# Patient Record
Sex: Female | Born: 1935 | Race: White | Hispanic: No | Smoking: Never smoker
Health system: Southern US, Community
[De-identification: ages and names within clinical notes are randomized; demographics above are authoritative.]

## PROBLEM LIST (undated history)

## (undated) DIAGNOSIS — M810 Age-related osteoporosis without current pathological fracture: Secondary | ICD-10-CM

## (undated) DIAGNOSIS — E119 Type 2 diabetes mellitus without complications: Secondary | ICD-10-CM

## (undated) DIAGNOSIS — G309 Alzheimer's disease, unspecified: Secondary | ICD-10-CM

## (undated) DIAGNOSIS — R1310 Dysphagia, unspecified: Secondary | ICD-10-CM

## (undated) DIAGNOSIS — N189 Chronic kidney disease, unspecified: Secondary | ICD-10-CM

## (undated) DIAGNOSIS — I10 Essential (primary) hypertension: Secondary | ICD-10-CM

## (undated) DIAGNOSIS — F0280 Dementia in other diseases classified elsewhere without behavioral disturbance: Secondary | ICD-10-CM

---

## 2014-07-21 DIAGNOSIS — N390 Urinary tract infection, site not specified: Secondary | ICD-10-CM | POA: Diagnosis not present

## 2014-07-21 DIAGNOSIS — F411 Generalized anxiety disorder: Secondary | ICD-10-CM | POA: Diagnosis not present

## 2014-08-18 DIAGNOSIS — R350 Frequency of micturition: Secondary | ICD-10-CM | POA: Diagnosis not present

## 2014-08-18 DIAGNOSIS — F411 Generalized anxiety disorder: Secondary | ICD-10-CM | POA: Diagnosis not present

## 2014-08-18 DIAGNOSIS — M542 Cervicalgia: Secondary | ICD-10-CM | POA: Diagnosis not present

## 2014-09-04 DIAGNOSIS — E119 Type 2 diabetes mellitus without complications: Secondary | ICD-10-CM | POA: Diagnosis not present

## 2014-09-04 DIAGNOSIS — I10 Essential (primary) hypertension: Secondary | ICD-10-CM | POA: Diagnosis not present

## 2014-09-04 DIAGNOSIS — E782 Mixed hyperlipidemia: Secondary | ICD-10-CM | POA: Diagnosis not present

## 2014-09-21 DIAGNOSIS — M542 Cervicalgia: Secondary | ICD-10-CM | POA: Diagnosis not present

## 2014-09-21 DIAGNOSIS — R8290 Unspecified abnormal findings in urine: Secondary | ICD-10-CM | POA: Diagnosis not present

## 2014-10-16 DIAGNOSIS — M62838 Other muscle spasm: Secondary | ICD-10-CM | POA: Diagnosis not present

## 2014-10-25 DIAGNOSIS — M47812 Spondylosis without myelopathy or radiculopathy, cervical region: Secondary | ICD-10-CM | POA: Diagnosis not present

## 2014-10-25 DIAGNOSIS — M5412 Radiculopathy, cervical region: Secondary | ICD-10-CM | POA: Diagnosis not present

## 2014-11-15 DIAGNOSIS — M47812 Spondylosis without myelopathy or radiculopathy, cervical region: Secondary | ICD-10-CM | POA: Diagnosis not present

## 2014-11-15 DIAGNOSIS — M4802 Spinal stenosis, cervical region: Secondary | ICD-10-CM | POA: Diagnosis not present

## 2014-11-15 DIAGNOSIS — M542 Cervicalgia: Secondary | ICD-10-CM | POA: Diagnosis not present

## 2014-11-15 DIAGNOSIS — M5412 Radiculopathy, cervical region: Secondary | ICD-10-CM | POA: Diagnosis not present

## 2014-11-15 DIAGNOSIS — R51 Headache: Secondary | ICD-10-CM | POA: Diagnosis not present

## 2014-11-23 DIAGNOSIS — M25561 Pain in right knee: Secondary | ICD-10-CM | POA: Diagnosis not present

## 2014-11-23 DIAGNOSIS — N390 Urinary tract infection, site not specified: Secondary | ICD-10-CM | POA: Diagnosis not present

## 2014-11-23 DIAGNOSIS — G8929 Other chronic pain: Secondary | ICD-10-CM | POA: Diagnosis not present

## 2014-11-23 DIAGNOSIS — F419 Anxiety disorder, unspecified: Secondary | ICD-10-CM | POA: Diagnosis not present

## 2014-11-28 DIAGNOSIS — I10 Essential (primary) hypertension: Secondary | ICD-10-CM | POA: Diagnosis not present

## 2014-11-28 DIAGNOSIS — Z79899 Other long term (current) drug therapy: Secondary | ICD-10-CM | POA: Diagnosis not present

## 2014-11-28 DIAGNOSIS — E780 Pure hypercholesterolemia: Secondary | ICD-10-CM | POA: Diagnosis not present

## 2014-11-28 DIAGNOSIS — E119 Type 2 diabetes mellitus without complications: Secondary | ICD-10-CM | POA: Diagnosis not present

## 2014-11-28 DIAGNOSIS — N390 Urinary tract infection, site not specified: Secondary | ICD-10-CM | POA: Diagnosis not present

## 2014-12-07 DIAGNOSIS — Z79899 Other long term (current) drug therapy: Secondary | ICD-10-CM | POA: Diagnosis not present

## 2014-12-07 DIAGNOSIS — E119 Type 2 diabetes mellitus without complications: Secondary | ICD-10-CM | POA: Diagnosis not present

## 2014-12-07 DIAGNOSIS — I10 Essential (primary) hypertension: Secondary | ICD-10-CM | POA: Diagnosis not present

## 2014-12-07 DIAGNOSIS — N390 Urinary tract infection, site not specified: Secondary | ICD-10-CM | POA: Diagnosis not present

## 2014-12-07 DIAGNOSIS — E785 Hyperlipidemia, unspecified: Secondary | ICD-10-CM | POA: Diagnosis not present

## 2014-12-07 DIAGNOSIS — F419 Anxiety disorder, unspecified: Secondary | ICD-10-CM | POA: Diagnosis not present

## 2014-12-08 DIAGNOSIS — N390 Urinary tract infection, site not specified: Secondary | ICD-10-CM | POA: Diagnosis not present

## 2015-01-05 DIAGNOSIS — E119 Type 2 diabetes mellitus without complications: Secondary | ICD-10-CM | POA: Diagnosis not present

## 2015-01-19 DIAGNOSIS — N390 Urinary tract infection, site not specified: Secondary | ICD-10-CM | POA: Diagnosis not present

## 2015-01-29 DIAGNOSIS — N390 Urinary tract infection, site not specified: Secondary | ICD-10-CM | POA: Diagnosis not present

## 2015-01-29 DIAGNOSIS — M810 Age-related osteoporosis without current pathological fracture: Secondary | ICD-10-CM | POA: Diagnosis not present

## 2015-01-29 DIAGNOSIS — Z6826 Body mass index (BMI) 26.0-26.9, adult: Secondary | ICD-10-CM | POA: Diagnosis not present

## 2015-01-29 DIAGNOSIS — I10 Essential (primary) hypertension: Secondary | ICD-10-CM | POA: Diagnosis not present

## 2015-01-29 DIAGNOSIS — R450 Nervousness: Secondary | ICD-10-CM | POA: Diagnosis not present

## 2015-02-02 DIAGNOSIS — R350 Frequency of micturition: Secondary | ICD-10-CM | POA: Diagnosis not present

## 2015-02-02 DIAGNOSIS — M949 Disorder of cartilage, unspecified: Secondary | ICD-10-CM | POA: Diagnosis not present

## 2015-02-02 DIAGNOSIS — R5383 Other fatigue: Secondary | ICD-10-CM | POA: Diagnosis not present

## 2015-02-02 DIAGNOSIS — I10 Essential (primary) hypertension: Secondary | ICD-10-CM | POA: Diagnosis not present

## 2015-02-02 DIAGNOSIS — R413 Other amnesia: Secondary | ICD-10-CM | POA: Diagnosis not present

## 2015-02-02 DIAGNOSIS — E785 Hyperlipidemia, unspecified: Secondary | ICD-10-CM | POA: Diagnosis not present

## 2015-02-18 DIAGNOSIS — N390 Urinary tract infection, site not specified: Secondary | ICD-10-CM | POA: Diagnosis not present

## 2015-02-18 DIAGNOSIS — I10 Essential (primary) hypertension: Secondary | ICD-10-CM | POA: Diagnosis not present

## 2015-02-18 DIAGNOSIS — E780 Pure hypercholesterolemia: Secondary | ICD-10-CM | POA: Diagnosis not present

## 2015-02-18 DIAGNOSIS — R1084 Generalized abdominal pain: Secondary | ICD-10-CM | POA: Diagnosis not present

## 2015-02-18 DIAGNOSIS — K5900 Constipation, unspecified: Secondary | ICD-10-CM | POA: Diagnosis not present

## 2015-02-18 DIAGNOSIS — Z79899 Other long term (current) drug therapy: Secondary | ICD-10-CM | POA: Diagnosis not present

## 2015-02-18 DIAGNOSIS — E119 Type 2 diabetes mellitus without complications: Secondary | ICD-10-CM | POA: Diagnosis not present

## 2015-02-18 DIAGNOSIS — F419 Anxiety disorder, unspecified: Secondary | ICD-10-CM | POA: Diagnosis not present

## 2015-02-18 DIAGNOSIS — R109 Unspecified abdominal pain: Secondary | ICD-10-CM | POA: Diagnosis not present

## 2015-02-21 DIAGNOSIS — N390 Urinary tract infection, site not specified: Secondary | ICD-10-CM | POA: Diagnosis not present

## 2015-03-02 DIAGNOSIS — H04123 Dry eye syndrome of bilateral lacrimal glands: Secondary | ICD-10-CM | POA: Diagnosis not present

## 2015-03-02 DIAGNOSIS — E119 Type 2 diabetes mellitus without complications: Secondary | ICD-10-CM | POA: Diagnosis not present

## 2015-03-02 DIAGNOSIS — M25561 Pain in right knee: Secondary | ICD-10-CM | POA: Diagnosis not present

## 2015-03-02 DIAGNOSIS — F419 Anxiety disorder, unspecified: Secondary | ICD-10-CM | POA: Diagnosis not present

## 2015-03-27 DIAGNOSIS — G894 Chronic pain syndrome: Secondary | ICD-10-CM | POA: Diagnosis not present

## 2015-04-19 DIAGNOSIS — Z1231 Encounter for screening mammogram for malignant neoplasm of breast: Secondary | ICD-10-CM | POA: Diagnosis not present

## 2015-04-22 DIAGNOSIS — S46911A Strain of unspecified muscle, fascia and tendon at shoulder and upper arm level, right arm, initial encounter: Secondary | ICD-10-CM | POA: Diagnosis not present

## 2015-04-22 DIAGNOSIS — Z79899 Other long term (current) drug therapy: Secondary | ICD-10-CM | POA: Diagnosis not present

## 2015-04-22 DIAGNOSIS — I10 Essential (primary) hypertension: Secondary | ICD-10-CM | POA: Diagnosis not present

## 2015-04-22 DIAGNOSIS — M25512 Pain in left shoulder: Secondary | ICD-10-CM | POA: Diagnosis not present

## 2015-04-22 DIAGNOSIS — E7800 Pure hypercholesterolemia, unspecified: Secondary | ICD-10-CM | POA: Diagnosis not present

## 2015-04-22 DIAGNOSIS — E119 Type 2 diabetes mellitus without complications: Secondary | ICD-10-CM | POA: Diagnosis not present

## 2015-04-22 DIAGNOSIS — M25511 Pain in right shoulder: Secondary | ICD-10-CM | POA: Diagnosis not present

## 2015-04-22 DIAGNOSIS — S46912A Strain of unspecified muscle, fascia and tendon at shoulder and upper arm level, left arm, initial encounter: Secondary | ICD-10-CM | POA: Diagnosis not present

## 2015-05-03 DIAGNOSIS — M25512 Pain in left shoulder: Secondary | ICD-10-CM | POA: Diagnosis not present

## 2015-05-03 DIAGNOSIS — Z79899 Other long term (current) drug therapy: Secondary | ICD-10-CM | POA: Diagnosis not present

## 2015-05-03 DIAGNOSIS — I10 Essential (primary) hypertension: Secondary | ICD-10-CM | POA: Diagnosis not present

## 2015-05-03 DIAGNOSIS — Z90710 Acquired absence of both cervix and uterus: Secondary | ICD-10-CM | POA: Diagnosis not present

## 2015-05-03 DIAGNOSIS — Z9049 Acquired absence of other specified parts of digestive tract: Secondary | ICD-10-CM | POA: Diagnosis not present

## 2015-05-03 DIAGNOSIS — E119 Type 2 diabetes mellitus without complications: Secondary | ICD-10-CM | POA: Diagnosis not present

## 2015-05-03 DIAGNOSIS — E7800 Pure hypercholesterolemia, unspecified: Secondary | ICD-10-CM | POA: Diagnosis not present

## 2015-05-03 DIAGNOSIS — M25511 Pain in right shoulder: Secondary | ICD-10-CM | POA: Diagnosis not present

## 2015-05-07 DIAGNOSIS — M7581 Other shoulder lesions, right shoulder: Secondary | ICD-10-CM | POA: Diagnosis not present

## 2015-05-09 DIAGNOSIS — M25561 Pain in right knee: Secondary | ICD-10-CM | POA: Diagnosis not present

## 2015-05-09 DIAGNOSIS — Z96653 Presence of artificial knee joint, bilateral: Secondary | ICD-10-CM | POA: Diagnosis not present

## 2015-05-09 DIAGNOSIS — M25562 Pain in left knee: Secondary | ICD-10-CM | POA: Diagnosis not present

## 2015-05-15 DIAGNOSIS — M7581 Other shoulder lesions, right shoulder: Secondary | ICD-10-CM | POA: Diagnosis not present

## 2015-05-21 DIAGNOSIS — Z23 Encounter for immunization: Secondary | ICD-10-CM | POA: Diagnosis not present

## 2015-06-05 DIAGNOSIS — Z79899 Other long term (current) drug therapy: Secondary | ICD-10-CM | POA: Diagnosis not present

## 2015-06-05 DIAGNOSIS — F419 Anxiety disorder, unspecified: Secondary | ICD-10-CM | POA: Diagnosis not present

## 2015-06-05 DIAGNOSIS — I10 Essential (primary) hypertension: Secondary | ICD-10-CM | POA: Diagnosis not present

## 2015-06-05 DIAGNOSIS — E559 Vitamin D deficiency, unspecified: Secondary | ICD-10-CM | POA: Diagnosis not present

## 2015-06-05 DIAGNOSIS — M7581 Other shoulder lesions, right shoulder: Secondary | ICD-10-CM | POA: Diagnosis not present

## 2015-06-05 DIAGNOSIS — Z6827 Body mass index (BMI) 27.0-27.9, adult: Secondary | ICD-10-CM | POA: Diagnosis not present

## 2015-06-05 DIAGNOSIS — N390 Urinary tract infection, site not specified: Secondary | ICD-10-CM | POA: Diagnosis not present

## 2015-06-05 DIAGNOSIS — E119 Type 2 diabetes mellitus without complications: Secondary | ICD-10-CM | POA: Diagnosis not present

## 2015-06-21 DIAGNOSIS — I10 Essential (primary) hypertension: Secondary | ICD-10-CM | POA: Diagnosis not present

## 2015-06-21 DIAGNOSIS — M7550 Bursitis of unspecified shoulder: Secondary | ICD-10-CM | POA: Diagnosis not present

## 2015-06-21 DIAGNOSIS — R4182 Altered mental status, unspecified: Secondary | ICD-10-CM | POA: Diagnosis not present

## 2015-06-21 DIAGNOSIS — E785 Hyperlipidemia, unspecified: Secondary | ICD-10-CM | POA: Diagnosis not present

## 2015-06-21 DIAGNOSIS — E119 Type 2 diabetes mellitus without complications: Secondary | ICD-10-CM | POA: Diagnosis not present

## 2015-06-21 DIAGNOSIS — N390 Urinary tract infection, site not specified: Secondary | ICD-10-CM | POA: Diagnosis not present

## 2015-07-19 DIAGNOSIS — I10 Essential (primary) hypertension: Secondary | ICD-10-CM | POA: Diagnosis not present

## 2015-07-19 DIAGNOSIS — F0390 Unspecified dementia without behavioral disturbance: Secondary | ICD-10-CM | POA: Diagnosis not present

## 2015-07-19 DIAGNOSIS — E782 Mixed hyperlipidemia: Secondary | ICD-10-CM | POA: Diagnosis not present

## 2015-07-19 DIAGNOSIS — E119 Type 2 diabetes mellitus without complications: Secondary | ICD-10-CM | POA: Diagnosis not present

## 2015-08-09 DIAGNOSIS — Z780 Asymptomatic menopausal state: Secondary | ICD-10-CM | POA: Diagnosis not present

## 2015-08-16 DIAGNOSIS — F411 Generalized anxiety disorder: Secondary | ICD-10-CM | POA: Diagnosis not present

## 2015-08-16 DIAGNOSIS — G309 Alzheimer's disease, unspecified: Secondary | ICD-10-CM | POA: Diagnosis not present

## 2015-08-16 DIAGNOSIS — E119 Type 2 diabetes mellitus without complications: Secondary | ICD-10-CM | POA: Diagnosis not present

## 2015-08-28 DIAGNOSIS — F0390 Unspecified dementia without behavioral disturbance: Secondary | ICD-10-CM | POA: Diagnosis not present

## 2015-08-28 DIAGNOSIS — F411 Generalized anxiety disorder: Secondary | ICD-10-CM | POA: Diagnosis not present

## 2015-09-10 DIAGNOSIS — F0390 Unspecified dementia without behavioral disturbance: Secondary | ICD-10-CM | POA: Diagnosis not present

## 2015-09-13 DIAGNOSIS — I10 Essential (primary) hypertension: Secondary | ICD-10-CM | POA: Diagnosis not present

## 2015-09-13 DIAGNOSIS — F0390 Unspecified dementia without behavioral disturbance: Secondary | ICD-10-CM | POA: Diagnosis not present

## 2015-09-13 DIAGNOSIS — F411 Generalized anxiety disorder: Secondary | ICD-10-CM | POA: Diagnosis not present

## 2015-09-13 DIAGNOSIS — R5383 Other fatigue: Secondary | ICD-10-CM | POA: Diagnosis not present

## 2015-09-13 DIAGNOSIS — E119 Type 2 diabetes mellitus without complications: Secondary | ICD-10-CM | POA: Diagnosis not present

## 2015-09-19 DIAGNOSIS — F0280 Dementia in other diseases classified elsewhere without behavioral disturbance: Secondary | ICD-10-CM | POA: Diagnosis not present

## 2015-09-19 DIAGNOSIS — J323 Chronic sphenoidal sinusitis: Secondary | ICD-10-CM | POA: Diagnosis not present

## 2015-09-19 DIAGNOSIS — F0390 Unspecified dementia without behavioral disturbance: Secondary | ICD-10-CM | POA: Diagnosis not present

## 2015-09-19 DIAGNOSIS — G319 Degenerative disease of nervous system, unspecified: Secondary | ICD-10-CM | POA: Diagnosis not present

## 2015-09-24 DIAGNOSIS — I639 Cerebral infarction, unspecified: Secondary | ICD-10-CM | POA: Diagnosis not present

## 2015-09-24 DIAGNOSIS — F0390 Unspecified dementia without behavioral disturbance: Secondary | ICD-10-CM | POA: Diagnosis not present

## 2015-09-28 DIAGNOSIS — Z9181 History of falling: Secondary | ICD-10-CM | POA: Diagnosis not present

## 2015-09-28 DIAGNOSIS — I10 Essential (primary) hypertension: Secondary | ICD-10-CM | POA: Diagnosis not present

## 2015-09-28 DIAGNOSIS — G309 Alzheimer's disease, unspecified: Secondary | ICD-10-CM | POA: Diagnosis not present

## 2015-09-28 DIAGNOSIS — Z87440 Personal history of urinary (tract) infections: Secondary | ICD-10-CM | POA: Diagnosis not present

## 2015-09-28 DIAGNOSIS — E119 Type 2 diabetes mellitus without complications: Secondary | ICD-10-CM | POA: Diagnosis not present

## 2015-09-28 DIAGNOSIS — F419 Anxiety disorder, unspecified: Secondary | ICD-10-CM | POA: Diagnosis not present

## 2015-09-28 DIAGNOSIS — E785 Hyperlipidemia, unspecified: Secondary | ICD-10-CM | POA: Diagnosis not present

## 2015-09-28 DIAGNOSIS — Z7982 Long term (current) use of aspirin: Secondary | ICD-10-CM | POA: Diagnosis not present

## 2015-09-28 DIAGNOSIS — Z8673 Personal history of transient ischemic attack (TIA), and cerebral infarction without residual deficits: Secondary | ICD-10-CM | POA: Diagnosis not present

## 2015-09-28 DIAGNOSIS — F0280 Dementia in other diseases classified elsewhere without behavioral disturbance: Secondary | ICD-10-CM | POA: Diagnosis not present

## 2015-09-28 DIAGNOSIS — R2681 Unsteadiness on feet: Secondary | ICD-10-CM | POA: Diagnosis not present

## 2015-09-28 DIAGNOSIS — M810 Age-related osteoporosis without current pathological fracture: Secondary | ICD-10-CM | POA: Diagnosis not present

## 2015-10-04 DIAGNOSIS — I10 Essential (primary) hypertension: Secondary | ICD-10-CM | POA: Diagnosis not present

## 2015-10-04 DIAGNOSIS — F419 Anxiety disorder, unspecified: Secondary | ICD-10-CM | POA: Diagnosis not present

## 2015-10-04 DIAGNOSIS — G309 Alzheimer's disease, unspecified: Secondary | ICD-10-CM | POA: Diagnosis not present

## 2015-10-04 DIAGNOSIS — F0280 Dementia in other diseases classified elsewhere without behavioral disturbance: Secondary | ICD-10-CM | POA: Diagnosis not present

## 2015-10-04 DIAGNOSIS — R2681 Unsteadiness on feet: Secondary | ICD-10-CM | POA: Diagnosis not present

## 2015-10-04 DIAGNOSIS — E119 Type 2 diabetes mellitus without complications: Secondary | ICD-10-CM | POA: Diagnosis not present

## 2015-10-05 DIAGNOSIS — I10 Essential (primary) hypertension: Secondary | ICD-10-CM | POA: Diagnosis not present

## 2015-10-05 DIAGNOSIS — F419 Anxiety disorder, unspecified: Secondary | ICD-10-CM | POA: Diagnosis not present

## 2015-10-05 DIAGNOSIS — R2681 Unsteadiness on feet: Secondary | ICD-10-CM | POA: Diagnosis not present

## 2015-10-05 DIAGNOSIS — E119 Type 2 diabetes mellitus without complications: Secondary | ICD-10-CM | POA: Diagnosis not present

## 2015-10-05 DIAGNOSIS — F0280 Dementia in other diseases classified elsewhere without behavioral disturbance: Secondary | ICD-10-CM | POA: Diagnosis not present

## 2015-10-05 DIAGNOSIS — G309 Alzheimer's disease, unspecified: Secondary | ICD-10-CM | POA: Diagnosis not present

## 2015-10-08 DIAGNOSIS — R2681 Unsteadiness on feet: Secondary | ICD-10-CM | POA: Diagnosis not present

## 2015-10-08 DIAGNOSIS — F0280 Dementia in other diseases classified elsewhere without behavioral disturbance: Secondary | ICD-10-CM | POA: Diagnosis not present

## 2015-10-08 DIAGNOSIS — I10 Essential (primary) hypertension: Secondary | ICD-10-CM | POA: Diagnosis not present

## 2015-10-08 DIAGNOSIS — E119 Type 2 diabetes mellitus without complications: Secondary | ICD-10-CM | POA: Diagnosis not present

## 2015-10-08 DIAGNOSIS — F419 Anxiety disorder, unspecified: Secondary | ICD-10-CM | POA: Diagnosis not present

## 2015-10-08 DIAGNOSIS — G309 Alzheimer's disease, unspecified: Secondary | ICD-10-CM | POA: Diagnosis not present

## 2015-10-12 DIAGNOSIS — R2681 Unsteadiness on feet: Secondary | ICD-10-CM | POA: Diagnosis not present

## 2015-10-12 DIAGNOSIS — I10 Essential (primary) hypertension: Secondary | ICD-10-CM | POA: Diagnosis not present

## 2015-10-12 DIAGNOSIS — F0280 Dementia in other diseases classified elsewhere without behavioral disturbance: Secondary | ICD-10-CM | POA: Diagnosis not present

## 2015-10-12 DIAGNOSIS — F419 Anxiety disorder, unspecified: Secondary | ICD-10-CM | POA: Diagnosis not present

## 2015-10-12 DIAGNOSIS — E119 Type 2 diabetes mellitus without complications: Secondary | ICD-10-CM | POA: Diagnosis not present

## 2015-10-12 DIAGNOSIS — G309 Alzheimer's disease, unspecified: Secondary | ICD-10-CM | POA: Diagnosis not present

## 2015-10-16 DIAGNOSIS — R2681 Unsteadiness on feet: Secondary | ICD-10-CM | POA: Diagnosis not present

## 2015-10-16 DIAGNOSIS — G309 Alzheimer's disease, unspecified: Secondary | ICD-10-CM | POA: Diagnosis not present

## 2015-10-16 DIAGNOSIS — F419 Anxiety disorder, unspecified: Secondary | ICD-10-CM | POA: Diagnosis not present

## 2015-10-16 DIAGNOSIS — F0280 Dementia in other diseases classified elsewhere without behavioral disturbance: Secondary | ICD-10-CM | POA: Diagnosis not present

## 2015-10-16 DIAGNOSIS — E119 Type 2 diabetes mellitus without complications: Secondary | ICD-10-CM | POA: Diagnosis not present

## 2015-10-16 DIAGNOSIS — I10 Essential (primary) hypertension: Secondary | ICD-10-CM | POA: Diagnosis not present

## 2015-10-18 DIAGNOSIS — F0280 Dementia in other diseases classified elsewhere without behavioral disturbance: Secondary | ICD-10-CM | POA: Diagnosis not present

## 2015-10-18 DIAGNOSIS — G309 Alzheimer's disease, unspecified: Secondary | ICD-10-CM | POA: Diagnosis not present

## 2015-10-18 DIAGNOSIS — E119 Type 2 diabetes mellitus without complications: Secondary | ICD-10-CM | POA: Diagnosis not present

## 2015-10-18 DIAGNOSIS — F419 Anxiety disorder, unspecified: Secondary | ICD-10-CM | POA: Diagnosis not present

## 2015-10-18 DIAGNOSIS — R2681 Unsteadiness on feet: Secondary | ICD-10-CM | POA: Diagnosis not present

## 2015-10-18 DIAGNOSIS — I10 Essential (primary) hypertension: Secondary | ICD-10-CM | POA: Diagnosis not present

## 2015-10-23 DIAGNOSIS — I10 Essential (primary) hypertension: Secondary | ICD-10-CM | POA: Diagnosis not present

## 2015-10-23 DIAGNOSIS — G309 Alzheimer's disease, unspecified: Secondary | ICD-10-CM | POA: Diagnosis not present

## 2015-10-23 DIAGNOSIS — F0280 Dementia in other diseases classified elsewhere without behavioral disturbance: Secondary | ICD-10-CM | POA: Diagnosis not present

## 2015-10-23 DIAGNOSIS — R2681 Unsteadiness on feet: Secondary | ICD-10-CM | POA: Diagnosis not present

## 2015-10-23 DIAGNOSIS — E119 Type 2 diabetes mellitus without complications: Secondary | ICD-10-CM | POA: Diagnosis not present

## 2015-10-23 DIAGNOSIS — F419 Anxiety disorder, unspecified: Secondary | ICD-10-CM | POA: Diagnosis not present

## 2015-10-26 DIAGNOSIS — E119 Type 2 diabetes mellitus without complications: Secondary | ICD-10-CM | POA: Diagnosis not present

## 2015-10-26 DIAGNOSIS — F0280 Dementia in other diseases classified elsewhere without behavioral disturbance: Secondary | ICD-10-CM | POA: Diagnosis not present

## 2015-10-26 DIAGNOSIS — G309 Alzheimer's disease, unspecified: Secondary | ICD-10-CM | POA: Diagnosis not present

## 2015-10-26 DIAGNOSIS — I10 Essential (primary) hypertension: Secondary | ICD-10-CM | POA: Diagnosis not present

## 2015-10-26 DIAGNOSIS — R2681 Unsteadiness on feet: Secondary | ICD-10-CM | POA: Diagnosis not present

## 2015-10-26 DIAGNOSIS — F419 Anxiety disorder, unspecified: Secondary | ICD-10-CM | POA: Diagnosis not present

## 2015-11-03 DIAGNOSIS — M25511 Pain in right shoulder: Secondary | ICD-10-CM | POA: Diagnosis not present

## 2015-11-03 DIAGNOSIS — G479 Sleep disorder, unspecified: Secondary | ICD-10-CM | POA: Diagnosis not present

## 2015-11-03 DIAGNOSIS — Z79891 Long term (current) use of opiate analgesic: Secondary | ICD-10-CM | POA: Diagnosis not present

## 2015-11-03 DIAGNOSIS — F419 Anxiety disorder, unspecified: Secondary | ICD-10-CM | POA: Diagnosis not present

## 2015-11-03 DIAGNOSIS — E119 Type 2 diabetes mellitus without complications: Secondary | ICD-10-CM | POA: Diagnosis not present

## 2015-11-03 DIAGNOSIS — Z8673 Personal history of transient ischemic attack (TIA), and cerebral infarction without residual deficits: Secondary | ICD-10-CM | POA: Diagnosis not present

## 2015-11-03 DIAGNOSIS — M1990 Unspecified osteoarthritis, unspecified site: Secondary | ICD-10-CM | POA: Diagnosis not present

## 2015-11-03 DIAGNOSIS — F0390 Unspecified dementia without behavioral disturbance: Secondary | ICD-10-CM | POA: Diagnosis not present

## 2015-11-03 DIAGNOSIS — M170 Bilateral primary osteoarthritis of knee: Secondary | ICD-10-CM | POA: Diagnosis not present

## 2015-11-03 DIAGNOSIS — M19011 Primary osteoarthritis, right shoulder: Secondary | ICD-10-CM | POA: Diagnosis not present

## 2015-11-03 DIAGNOSIS — Z79899 Other long term (current) drug therapy: Secondary | ICD-10-CM | POA: Diagnosis not present

## 2015-11-03 DIAGNOSIS — G8929 Other chronic pain: Secondary | ICD-10-CM | POA: Diagnosis not present

## 2015-11-03 DIAGNOSIS — I10 Essential (primary) hypertension: Secondary | ICD-10-CM | POA: Diagnosis not present

## 2015-11-03 DIAGNOSIS — M19012 Primary osteoarthritis, left shoulder: Secondary | ICD-10-CM | POA: Diagnosis not present

## 2015-11-03 DIAGNOSIS — Z791 Long term (current) use of non-steroidal anti-inflammatories (NSAID): Secondary | ICD-10-CM | POA: Diagnosis not present

## 2015-11-05 DIAGNOSIS — G309 Alzheimer's disease, unspecified: Secondary | ICD-10-CM | POA: Diagnosis not present

## 2015-11-05 DIAGNOSIS — R2681 Unsteadiness on feet: Secondary | ICD-10-CM | POA: Diagnosis not present

## 2015-11-05 DIAGNOSIS — E119 Type 2 diabetes mellitus without complications: Secondary | ICD-10-CM | POA: Diagnosis not present

## 2015-11-05 DIAGNOSIS — F0280 Dementia in other diseases classified elsewhere without behavioral disturbance: Secondary | ICD-10-CM | POA: Diagnosis not present

## 2015-11-08 DIAGNOSIS — H26493 Other secondary cataract, bilateral: Secondary | ICD-10-CM | POA: Diagnosis not present

## 2015-11-13 DIAGNOSIS — E782 Mixed hyperlipidemia: Secondary | ICD-10-CM | POA: Diagnosis not present

## 2015-11-13 DIAGNOSIS — E119 Type 2 diabetes mellitus without complications: Secondary | ICD-10-CM | POA: Diagnosis not present

## 2015-11-13 DIAGNOSIS — R32 Unspecified urinary incontinence: Secondary | ICD-10-CM | POA: Diagnosis not present

## 2015-11-13 DIAGNOSIS — F0390 Unspecified dementia without behavioral disturbance: Secondary | ICD-10-CM | POA: Diagnosis not present

## 2015-11-13 DIAGNOSIS — F411 Generalized anxiety disorder: Secondary | ICD-10-CM | POA: Diagnosis not present

## 2015-11-13 DIAGNOSIS — I10 Essential (primary) hypertension: Secondary | ICD-10-CM | POA: Diagnosis not present

## 2015-11-19 DIAGNOSIS — G8929 Other chronic pain: Secondary | ICD-10-CM | POA: Diagnosis not present

## 2015-11-19 DIAGNOSIS — S0990XA Unspecified injury of head, initial encounter: Secondary | ICD-10-CM | POA: Diagnosis not present

## 2015-11-19 DIAGNOSIS — S0093XA Contusion of unspecified part of head, initial encounter: Secondary | ICD-10-CM | POA: Diagnosis not present

## 2015-11-19 DIAGNOSIS — G319 Degenerative disease of nervous system, unspecified: Secondary | ICD-10-CM | POA: Diagnosis not present

## 2015-11-19 DIAGNOSIS — M25511 Pain in right shoulder: Secondary | ICD-10-CM | POA: Diagnosis not present

## 2015-11-19 DIAGNOSIS — S20211A Contusion of right front wall of thorax, initial encounter: Secondary | ICD-10-CM | POA: Diagnosis not present

## 2015-11-19 DIAGNOSIS — F0390 Unspecified dementia without behavioral disturbance: Secondary | ICD-10-CM | POA: Diagnosis not present

## 2015-11-19 DIAGNOSIS — S14109A Unspecified injury at unspecified level of cervical spinal cord, initial encounter: Secondary | ICD-10-CM | POA: Diagnosis not present

## 2015-11-19 DIAGNOSIS — S0083XA Contusion of other part of head, initial encounter: Secondary | ICD-10-CM | POA: Diagnosis not present

## 2015-11-19 DIAGNOSIS — E119 Type 2 diabetes mellitus without complications: Secondary | ICD-10-CM | POA: Diagnosis not present

## 2015-11-19 DIAGNOSIS — E7800 Pure hypercholesterolemia, unspecified: Secondary | ICD-10-CM | POA: Diagnosis not present

## 2015-11-19 DIAGNOSIS — Z79899 Other long term (current) drug therapy: Secondary | ICD-10-CM | POA: Diagnosis not present

## 2015-11-19 DIAGNOSIS — I10 Essential (primary) hypertension: Secondary | ICD-10-CM | POA: Diagnosis not present

## 2015-11-19 DIAGNOSIS — R0781 Pleurodynia: Secondary | ICD-10-CM | POA: Diagnosis not present

## 2015-11-19 DIAGNOSIS — Z8673 Personal history of transient ischemic attack (TIA), and cerebral infarction without residual deficits: Secondary | ICD-10-CM | POA: Diagnosis not present

## 2015-12-04 DIAGNOSIS — F0390 Unspecified dementia without behavioral disturbance: Secondary | ICD-10-CM | POA: Diagnosis not present

## 2015-12-04 DIAGNOSIS — F411 Generalized anxiety disorder: Secondary | ICD-10-CM | POA: Diagnosis not present

## 2015-12-04 DIAGNOSIS — G8929 Other chronic pain: Secondary | ICD-10-CM | POA: Diagnosis not present

## 2015-12-04 DIAGNOSIS — Z111 Encounter for screening for respiratory tuberculosis: Secondary | ICD-10-CM | POA: Diagnosis not present

## 2016-01-16 DIAGNOSIS — N390 Urinary tract infection, site not specified: Secondary | ICD-10-CM | POA: Diagnosis not present

## 2016-03-04 DIAGNOSIS — N390 Urinary tract infection, site not specified: Secondary | ICD-10-CM | POA: Diagnosis not present

## 2016-03-23 DIAGNOSIS — M25511 Pain in right shoulder: Secondary | ICD-10-CM | POA: Diagnosis not present

## 2016-03-24 DIAGNOSIS — N390 Urinary tract infection, site not specified: Secondary | ICD-10-CM | POA: Diagnosis not present

## 2016-04-08 DIAGNOSIS — I10 Essential (primary) hypertension: Secondary | ICD-10-CM | POA: Diagnosis not present

## 2016-04-08 DIAGNOSIS — E874 Mixed disorder of acid-base balance: Secondary | ICD-10-CM | POA: Diagnosis not present

## 2016-04-08 DIAGNOSIS — D649 Anemia, unspecified: Secondary | ICD-10-CM | POA: Diagnosis not present

## 2016-06-12 DIAGNOSIS — L0390 Cellulitis, unspecified: Secondary | ICD-10-CM | POA: Diagnosis not present

## 2016-06-23 DIAGNOSIS — B351 Tinea unguium: Secondary | ICD-10-CM | POA: Diagnosis not present

## 2016-06-23 DIAGNOSIS — L301 Dyshidrosis [pompholyx]: Secondary | ICD-10-CM | POA: Diagnosis not present

## 2016-07-08 DIAGNOSIS — E785 Hyperlipidemia, unspecified: Secondary | ICD-10-CM | POA: Diagnosis not present

## 2016-07-08 DIAGNOSIS — I10 Essential (primary) hypertension: Secondary | ICD-10-CM | POA: Diagnosis not present

## 2016-07-17 DIAGNOSIS — R300 Dysuria: Secondary | ICD-10-CM | POA: Diagnosis not present

## 2016-07-17 DIAGNOSIS — T148XXA Other injury of unspecified body region, initial encounter: Secondary | ICD-10-CM | POA: Diagnosis not present

## 2016-07-17 DIAGNOSIS — W19XXXA Unspecified fall, initial encounter: Secondary | ICD-10-CM | POA: Diagnosis not present

## 2016-07-17 DIAGNOSIS — G4700 Insomnia, unspecified: Secondary | ICD-10-CM | POA: Diagnosis not present

## 2016-07-17 DIAGNOSIS — L209 Atopic dermatitis, unspecified: Secondary | ICD-10-CM | POA: Diagnosis not present

## 2016-07-17 DIAGNOSIS — R358 Other polyuria: Secondary | ICD-10-CM | POA: Diagnosis not present

## 2016-08-19 DIAGNOSIS — L609 Nail disorder, unspecified: Secondary | ICD-10-CM | POA: Diagnosis not present

## 2016-08-19 DIAGNOSIS — L600 Ingrowing nail: Secondary | ICD-10-CM | POA: Diagnosis not present

## 2016-08-21 DIAGNOSIS — M25511 Pain in right shoulder: Secondary | ICD-10-CM | POA: Diagnosis not present

## 2016-08-21 DIAGNOSIS — I10 Essential (primary) hypertension: Secondary | ICD-10-CM | POA: Diagnosis not present

## 2016-08-21 DIAGNOSIS — R269 Unspecified abnormalities of gait and mobility: Secondary | ICD-10-CM | POA: Diagnosis not present

## 2016-08-21 DIAGNOSIS — G309 Alzheimer's disease, unspecified: Secondary | ICD-10-CM | POA: Diagnosis not present

## 2016-09-02 DIAGNOSIS — E119 Type 2 diabetes mellitus without complications: Secondary | ICD-10-CM | POA: Diagnosis not present

## 2016-09-02 DIAGNOSIS — E782 Mixed hyperlipidemia: Secondary | ICD-10-CM | POA: Diagnosis not present

## 2016-09-02 DIAGNOSIS — E559 Vitamin D deficiency, unspecified: Secondary | ICD-10-CM | POA: Diagnosis not present

## 2016-09-02 DIAGNOSIS — D518 Other vitamin B12 deficiency anemias: Secondary | ICD-10-CM | POA: Diagnosis not present

## 2016-09-02 DIAGNOSIS — Z79899 Other long term (current) drug therapy: Secondary | ICD-10-CM | POA: Diagnosis not present

## 2016-09-02 DIAGNOSIS — E038 Other specified hypothyroidism: Secondary | ICD-10-CM | POA: Diagnosis not present

## 2016-09-05 DIAGNOSIS — E119 Type 2 diabetes mellitus without complications: Secondary | ICD-10-CM | POA: Diagnosis not present

## 2016-09-05 DIAGNOSIS — Z79899 Other long term (current) drug therapy: Secondary | ICD-10-CM | POA: Diagnosis not present

## 2016-09-25 DIAGNOSIS — K5900 Constipation, unspecified: Secondary | ICD-10-CM | POA: Diagnosis not present

## 2016-10-09 DIAGNOSIS — R54 Age-related physical debility: Secondary | ICD-10-CM | POA: Diagnosis not present

## 2016-10-09 DIAGNOSIS — J309 Allergic rhinitis, unspecified: Secondary | ICD-10-CM | POA: Diagnosis not present

## 2016-10-09 DIAGNOSIS — R05 Cough: Secondary | ICD-10-CM | POA: Diagnosis not present

## 2016-11-11 DIAGNOSIS — L600 Ingrowing nail: Secondary | ICD-10-CM | POA: Diagnosis not present

## 2016-11-11 DIAGNOSIS — L609 Nail disorder, unspecified: Secondary | ICD-10-CM | POA: Diagnosis not present

## 2016-11-13 DIAGNOSIS — G309 Alzheimer's disease, unspecified: Secondary | ICD-10-CM | POA: Diagnosis not present

## 2016-11-13 DIAGNOSIS — E119 Type 2 diabetes mellitus without complications: Secondary | ICD-10-CM | POA: Diagnosis not present

## 2016-11-13 DIAGNOSIS — D649 Anemia, unspecified: Secondary | ICD-10-CM | POA: Diagnosis not present

## 2016-11-13 DIAGNOSIS — I10 Essential (primary) hypertension: Secondary | ICD-10-CM | POA: Diagnosis not present

## 2017-01-08 DIAGNOSIS — R635 Abnormal weight gain: Secondary | ICD-10-CM | POA: Diagnosis not present

## 2017-01-08 DIAGNOSIS — R54 Age-related physical debility: Secondary | ICD-10-CM | POA: Diagnosis not present

## 2017-01-08 DIAGNOSIS — G309 Alzheimer's disease, unspecified: Secondary | ICD-10-CM | POA: Diagnosis not present

## 2017-01-12 DIAGNOSIS — Z79899 Other long term (current) drug therapy: Secondary | ICD-10-CM | POA: Diagnosis not present

## 2017-01-12 DIAGNOSIS — D518 Other vitamin B12 deficiency anemias: Secondary | ICD-10-CM | POA: Diagnosis not present

## 2017-01-12 DIAGNOSIS — E782 Mixed hyperlipidemia: Secondary | ICD-10-CM | POA: Diagnosis not present

## 2017-01-12 DIAGNOSIS — E119 Type 2 diabetes mellitus without complications: Secondary | ICD-10-CM | POA: Diagnosis not present

## 2017-01-25 DIAGNOSIS — L570 Actinic keratosis: Secondary | ICD-10-CM | POA: Diagnosis not present

## 2017-01-28 DIAGNOSIS — R233 Spontaneous ecchymoses: Secondary | ICD-10-CM | POA: Diagnosis not present

## 2017-01-28 DIAGNOSIS — L570 Actinic keratosis: Secondary | ICD-10-CM | POA: Diagnosis not present

## 2017-01-28 DIAGNOSIS — B351 Tinea unguium: Secondary | ICD-10-CM | POA: Diagnosis not present

## 2017-01-30 DIAGNOSIS — I700 Atherosclerosis of aorta: Secondary | ICD-10-CM | POA: Diagnosis not present

## 2017-01-30 DIAGNOSIS — M79661 Pain in right lower leg: Secondary | ICD-10-CM | POA: Diagnosis not present

## 2017-01-30 DIAGNOSIS — R609 Edema, unspecified: Secondary | ICD-10-CM | POA: Diagnosis not present

## 2017-02-12 DIAGNOSIS — M79606 Pain in leg, unspecified: Secondary | ICD-10-CM | POA: Diagnosis not present

## 2017-02-12 DIAGNOSIS — R600 Localized edema: Secondary | ICD-10-CM | POA: Diagnosis not present

## 2017-02-12 DIAGNOSIS — G309 Alzheimer's disease, unspecified: Secondary | ICD-10-CM | POA: Diagnosis not present

## 2017-02-12 DIAGNOSIS — R269 Unspecified abnormalities of gait and mobility: Secondary | ICD-10-CM | POA: Diagnosis not present

## 2017-02-12 DIAGNOSIS — R54 Age-related physical debility: Secondary | ICD-10-CM | POA: Diagnosis not present

## 2017-02-17 DIAGNOSIS — L609 Nail disorder, unspecified: Secondary | ICD-10-CM | POA: Diagnosis not present

## 2017-03-05 DIAGNOSIS — I10 Essential (primary) hypertension: Secondary | ICD-10-CM | POA: Diagnosis not present

## 2017-03-05 DIAGNOSIS — G309 Alzheimer's disease, unspecified: Secondary | ICD-10-CM | POA: Diagnosis not present

## 2017-03-05 DIAGNOSIS — D649 Anemia, unspecified: Secondary | ICD-10-CM | POA: Diagnosis not present

## 2017-03-05 DIAGNOSIS — E119 Type 2 diabetes mellitus without complications: Secondary | ICD-10-CM | POA: Diagnosis not present

## 2017-03-17 DIAGNOSIS — Z79899 Other long term (current) drug therapy: Secondary | ICD-10-CM | POA: Diagnosis not present

## 2017-03-17 DIAGNOSIS — E119 Type 2 diabetes mellitus without complications: Secondary | ICD-10-CM | POA: Diagnosis not present

## 2017-03-17 DIAGNOSIS — E782 Mixed hyperlipidemia: Secondary | ICD-10-CM | POA: Diagnosis not present

## 2017-03-17 DIAGNOSIS — D518 Other vitamin B12 deficiency anemias: Secondary | ICD-10-CM | POA: Diagnosis not present

## 2017-04-01 DIAGNOSIS — E1165 Type 2 diabetes mellitus with hyperglycemia: Secondary | ICD-10-CM | POA: Diagnosis not present

## 2017-04-01 DIAGNOSIS — R410 Disorientation, unspecified: Secondary | ICD-10-CM | POA: Diagnosis not present

## 2017-04-01 DIAGNOSIS — R300 Dysuria: Secondary | ICD-10-CM | POA: Diagnosis not present

## 2017-04-13 DIAGNOSIS — N390 Urinary tract infection, site not specified: Secondary | ICD-10-CM | POA: Diagnosis not present

## 2017-04-30 DIAGNOSIS — R269 Unspecified abnormalities of gait and mobility: Secondary | ICD-10-CM | POA: Diagnosis not present

## 2017-04-30 DIAGNOSIS — G309 Alzheimer's disease, unspecified: Secondary | ICD-10-CM | POA: Diagnosis not present

## 2017-04-30 DIAGNOSIS — R54 Age-related physical debility: Secondary | ICD-10-CM | POA: Diagnosis not present

## 2017-04-30 DIAGNOSIS — M6281 Muscle weakness (generalized): Secondary | ICD-10-CM | POA: Diagnosis not present

## 2017-05-01 DIAGNOSIS — E119 Type 2 diabetes mellitus without complications: Secondary | ICD-10-CM | POA: Diagnosis not present

## 2017-05-01 DIAGNOSIS — D518 Other vitamin B12 deficiency anemias: Secondary | ICD-10-CM | POA: Diagnosis not present

## 2017-05-01 DIAGNOSIS — E7849 Other hyperlipidemia: Secondary | ICD-10-CM | POA: Diagnosis not present

## 2017-05-01 DIAGNOSIS — Z79899 Other long term (current) drug therapy: Secondary | ICD-10-CM | POA: Diagnosis not present

## 2017-05-05 DIAGNOSIS — L600 Ingrowing nail: Secondary | ICD-10-CM | POA: Diagnosis not present

## 2017-05-05 DIAGNOSIS — L03031 Cellulitis of right toe: Secondary | ICD-10-CM | POA: Diagnosis not present

## 2017-05-05 DIAGNOSIS — L609 Nail disorder, unspecified: Secondary | ICD-10-CM | POA: Diagnosis not present

## 2017-05-07 DIAGNOSIS — E119 Type 2 diabetes mellitus without complications: Secondary | ICD-10-CM | POA: Diagnosis not present

## 2017-05-07 DIAGNOSIS — Z7982 Long term (current) use of aspirin: Secondary | ICD-10-CM | POA: Diagnosis not present

## 2017-05-07 DIAGNOSIS — F0280 Dementia in other diseases classified elsewhere without behavioral disturbance: Secondary | ICD-10-CM | POA: Diagnosis not present

## 2017-05-07 DIAGNOSIS — Z9181 History of falling: Secondary | ICD-10-CM | POA: Diagnosis not present

## 2017-05-07 DIAGNOSIS — M6281 Muscle weakness (generalized): Secondary | ICD-10-CM | POA: Diagnosis not present

## 2017-05-07 DIAGNOSIS — I10 Essential (primary) hypertension: Secondary | ICD-10-CM | POA: Diagnosis not present

## 2017-05-07 DIAGNOSIS — R2689 Other abnormalities of gait and mobility: Secondary | ICD-10-CM | POA: Diagnosis not present

## 2017-05-07 DIAGNOSIS — F329 Major depressive disorder, single episode, unspecified: Secondary | ICD-10-CM | POA: Diagnosis not present

## 2017-05-07 DIAGNOSIS — G309 Alzheimer's disease, unspecified: Secondary | ICD-10-CM | POA: Diagnosis not present

## 2017-05-12 DIAGNOSIS — R2689 Other abnormalities of gait and mobility: Secondary | ICD-10-CM | POA: Diagnosis not present

## 2017-05-12 DIAGNOSIS — Z9181 History of falling: Secondary | ICD-10-CM | POA: Diagnosis not present

## 2017-05-12 DIAGNOSIS — M6281 Muscle weakness (generalized): Secondary | ICD-10-CM | POA: Diagnosis not present

## 2017-05-12 DIAGNOSIS — E119 Type 2 diabetes mellitus without complications: Secondary | ICD-10-CM | POA: Diagnosis not present

## 2017-05-12 DIAGNOSIS — I10 Essential (primary) hypertension: Secondary | ICD-10-CM | POA: Diagnosis not present

## 2017-05-12 DIAGNOSIS — G309 Alzheimer's disease, unspecified: Secondary | ICD-10-CM | POA: Diagnosis not present

## 2017-05-15 DIAGNOSIS — M6281 Muscle weakness (generalized): Secondary | ICD-10-CM | POA: Diagnosis not present

## 2017-05-15 DIAGNOSIS — E119 Type 2 diabetes mellitus without complications: Secondary | ICD-10-CM | POA: Diagnosis not present

## 2017-05-15 DIAGNOSIS — G309 Alzheimer's disease, unspecified: Secondary | ICD-10-CM | POA: Diagnosis not present

## 2017-05-15 DIAGNOSIS — Z9181 History of falling: Secondary | ICD-10-CM | POA: Diagnosis not present

## 2017-05-15 DIAGNOSIS — R2689 Other abnormalities of gait and mobility: Secondary | ICD-10-CM | POA: Diagnosis not present

## 2017-05-15 DIAGNOSIS — I10 Essential (primary) hypertension: Secondary | ICD-10-CM | POA: Diagnosis not present

## 2017-05-18 DIAGNOSIS — M6281 Muscle weakness (generalized): Secondary | ICD-10-CM | POA: Diagnosis not present

## 2017-05-18 DIAGNOSIS — E119 Type 2 diabetes mellitus without complications: Secondary | ICD-10-CM | POA: Diagnosis not present

## 2017-05-18 DIAGNOSIS — Z9181 History of falling: Secondary | ICD-10-CM | POA: Diagnosis not present

## 2017-05-18 DIAGNOSIS — I10 Essential (primary) hypertension: Secondary | ICD-10-CM | POA: Diagnosis not present

## 2017-05-18 DIAGNOSIS — G309 Alzheimer's disease, unspecified: Secondary | ICD-10-CM | POA: Diagnosis not present

## 2017-05-18 DIAGNOSIS — R2689 Other abnormalities of gait and mobility: Secondary | ICD-10-CM | POA: Diagnosis not present

## 2017-05-20 DIAGNOSIS — R2689 Other abnormalities of gait and mobility: Secondary | ICD-10-CM | POA: Diagnosis not present

## 2017-05-20 DIAGNOSIS — E119 Type 2 diabetes mellitus without complications: Secondary | ICD-10-CM | POA: Diagnosis not present

## 2017-05-20 DIAGNOSIS — Z9181 History of falling: Secondary | ICD-10-CM | POA: Diagnosis not present

## 2017-05-20 DIAGNOSIS — M6281 Muscle weakness (generalized): Secondary | ICD-10-CM | POA: Diagnosis not present

## 2017-05-20 DIAGNOSIS — G309 Alzheimer's disease, unspecified: Secondary | ICD-10-CM | POA: Diagnosis not present

## 2017-05-20 DIAGNOSIS — I10 Essential (primary) hypertension: Secondary | ICD-10-CM | POA: Diagnosis not present

## 2017-05-21 DIAGNOSIS — R269 Unspecified abnormalities of gait and mobility: Secondary | ICD-10-CM | POA: Diagnosis not present

## 2017-05-21 DIAGNOSIS — R54 Age-related physical debility: Secondary | ICD-10-CM | POA: Diagnosis not present

## 2017-05-21 DIAGNOSIS — R2689 Other abnormalities of gait and mobility: Secondary | ICD-10-CM | POA: Diagnosis not present

## 2017-05-21 DIAGNOSIS — M6281 Muscle weakness (generalized): Secondary | ICD-10-CM | POA: Diagnosis not present

## 2017-05-25 DIAGNOSIS — E119 Type 2 diabetes mellitus without complications: Secondary | ICD-10-CM | POA: Diagnosis not present

## 2017-05-25 DIAGNOSIS — G309 Alzheimer's disease, unspecified: Secondary | ICD-10-CM | POA: Diagnosis not present

## 2017-05-25 DIAGNOSIS — Z9181 History of falling: Secondary | ICD-10-CM | POA: Diagnosis not present

## 2017-05-25 DIAGNOSIS — R2689 Other abnormalities of gait and mobility: Secondary | ICD-10-CM | POA: Diagnosis not present

## 2017-05-25 DIAGNOSIS — M6281 Muscle weakness (generalized): Secondary | ICD-10-CM | POA: Diagnosis not present

## 2017-05-25 DIAGNOSIS — I10 Essential (primary) hypertension: Secondary | ICD-10-CM | POA: Diagnosis not present

## 2017-05-26 DIAGNOSIS — I10 Essential (primary) hypertension: Secondary | ICD-10-CM | POA: Diagnosis not present

## 2017-05-26 DIAGNOSIS — R2689 Other abnormalities of gait and mobility: Secondary | ICD-10-CM | POA: Diagnosis not present

## 2017-05-26 DIAGNOSIS — M6281 Muscle weakness (generalized): Secondary | ICD-10-CM | POA: Diagnosis not present

## 2017-05-26 DIAGNOSIS — Z9181 History of falling: Secondary | ICD-10-CM | POA: Diagnosis not present

## 2017-05-26 DIAGNOSIS — G309 Alzheimer's disease, unspecified: Secondary | ICD-10-CM | POA: Diagnosis not present

## 2017-05-26 DIAGNOSIS — E119 Type 2 diabetes mellitus without complications: Secondary | ICD-10-CM | POA: Diagnosis not present

## 2017-06-02 DIAGNOSIS — E119 Type 2 diabetes mellitus without complications: Secondary | ICD-10-CM | POA: Diagnosis not present

## 2017-06-02 DIAGNOSIS — I10 Essential (primary) hypertension: Secondary | ICD-10-CM | POA: Diagnosis not present

## 2017-06-02 DIAGNOSIS — M6281 Muscle weakness (generalized): Secondary | ICD-10-CM | POA: Diagnosis not present

## 2017-06-02 DIAGNOSIS — R2689 Other abnormalities of gait and mobility: Secondary | ICD-10-CM | POA: Diagnosis not present

## 2017-06-02 DIAGNOSIS — Z9181 History of falling: Secondary | ICD-10-CM | POA: Diagnosis not present

## 2017-06-02 DIAGNOSIS — G309 Alzheimer's disease, unspecified: Secondary | ICD-10-CM | POA: Diagnosis not present

## 2017-06-03 DIAGNOSIS — G309 Alzheimer's disease, unspecified: Secondary | ICD-10-CM | POA: Diagnosis not present

## 2017-06-03 DIAGNOSIS — R4182 Altered mental status, unspecified: Secondary | ICD-10-CM | POA: Diagnosis not present

## 2017-06-03 DIAGNOSIS — Z87440 Personal history of urinary (tract) infections: Secondary | ICD-10-CM | POA: Diagnosis not present

## 2017-06-03 DIAGNOSIS — R54 Age-related physical debility: Secondary | ICD-10-CM | POA: Diagnosis not present

## 2017-06-05 DIAGNOSIS — N390 Urinary tract infection, site not specified: Secondary | ICD-10-CM | POA: Diagnosis not present

## 2017-06-09 DIAGNOSIS — G309 Alzheimer's disease, unspecified: Secondary | ICD-10-CM | POA: Diagnosis not present

## 2017-06-09 DIAGNOSIS — R2689 Other abnormalities of gait and mobility: Secondary | ICD-10-CM | POA: Diagnosis not present

## 2017-06-09 DIAGNOSIS — E119 Type 2 diabetes mellitus without complications: Secondary | ICD-10-CM | POA: Diagnosis not present

## 2017-06-09 DIAGNOSIS — I10 Essential (primary) hypertension: Secondary | ICD-10-CM | POA: Diagnosis not present

## 2017-06-09 DIAGNOSIS — Z9181 History of falling: Secondary | ICD-10-CM | POA: Diagnosis not present

## 2017-06-09 DIAGNOSIS — M6281 Muscle weakness (generalized): Secondary | ICD-10-CM | POA: Diagnosis not present

## 2017-06-11 DIAGNOSIS — R269 Unspecified abnormalities of gait and mobility: Secondary | ICD-10-CM | POA: Diagnosis not present

## 2017-06-11 DIAGNOSIS — R4182 Altered mental status, unspecified: Secondary | ICD-10-CM | POA: Diagnosis not present

## 2017-06-11 DIAGNOSIS — G309 Alzheimer's disease, unspecified: Secondary | ICD-10-CM | POA: Diagnosis not present

## 2017-06-11 DIAGNOSIS — R54 Age-related physical debility: Secondary | ICD-10-CM | POA: Diagnosis not present

## 2017-06-11 DIAGNOSIS — W19XXXA Unspecified fall, initial encounter: Secondary | ICD-10-CM | POA: Diagnosis not present

## 2017-06-11 DIAGNOSIS — M6281 Muscle weakness (generalized): Secondary | ICD-10-CM | POA: Diagnosis not present

## 2017-06-15 DIAGNOSIS — G309 Alzheimer's disease, unspecified: Secondary | ICD-10-CM | POA: Diagnosis not present

## 2017-06-15 DIAGNOSIS — I10 Essential (primary) hypertension: Secondary | ICD-10-CM | POA: Diagnosis not present

## 2017-06-15 DIAGNOSIS — E119 Type 2 diabetes mellitus without complications: Secondary | ICD-10-CM | POA: Diagnosis not present

## 2017-06-15 DIAGNOSIS — R2689 Other abnormalities of gait and mobility: Secondary | ICD-10-CM | POA: Diagnosis not present

## 2017-06-15 DIAGNOSIS — M6281 Muscle weakness (generalized): Secondary | ICD-10-CM | POA: Diagnosis not present

## 2017-06-15 DIAGNOSIS — Z9181 History of falling: Secondary | ICD-10-CM | POA: Diagnosis not present

## 2017-06-17 DIAGNOSIS — E7849 Other hyperlipidemia: Secondary | ICD-10-CM | POA: Diagnosis not present

## 2017-06-17 DIAGNOSIS — Z79899 Other long term (current) drug therapy: Secondary | ICD-10-CM | POA: Diagnosis not present

## 2017-06-17 DIAGNOSIS — E038 Other specified hypothyroidism: Secondary | ICD-10-CM | POA: Diagnosis not present

## 2017-06-17 DIAGNOSIS — E119 Type 2 diabetes mellitus without complications: Secondary | ICD-10-CM | POA: Diagnosis not present

## 2017-06-17 DIAGNOSIS — D518 Other vitamin B12 deficiency anemias: Secondary | ICD-10-CM | POA: Diagnosis not present

## 2017-06-25 DIAGNOSIS — I10 Essential (primary) hypertension: Secondary | ICD-10-CM | POA: Diagnosis not present

## 2017-06-25 DIAGNOSIS — R2689 Other abnormalities of gait and mobility: Secondary | ICD-10-CM | POA: Diagnosis not present

## 2017-06-25 DIAGNOSIS — E119 Type 2 diabetes mellitus without complications: Secondary | ICD-10-CM | POA: Diagnosis not present

## 2017-06-25 DIAGNOSIS — G309 Alzheimer's disease, unspecified: Secondary | ICD-10-CM | POA: Diagnosis not present

## 2017-06-25 DIAGNOSIS — Z9181 History of falling: Secondary | ICD-10-CM | POA: Diagnosis not present

## 2017-06-25 DIAGNOSIS — M6281 Muscle weakness (generalized): Secondary | ICD-10-CM | POA: Diagnosis not present

## 2017-07-02 DIAGNOSIS — E119 Type 2 diabetes mellitus without complications: Secondary | ICD-10-CM | POA: Diagnosis not present

## 2017-07-02 DIAGNOSIS — D649 Anemia, unspecified: Secondary | ICD-10-CM | POA: Diagnosis not present

## 2017-07-02 DIAGNOSIS — G309 Alzheimer's disease, unspecified: Secondary | ICD-10-CM | POA: Diagnosis not present

## 2017-07-02 DIAGNOSIS — I10 Essential (primary) hypertension: Secondary | ICD-10-CM | POA: Diagnosis not present

## 2017-07-16 DIAGNOSIS — M79622 Pain in left upper arm: Secondary | ICD-10-CM | POA: Diagnosis not present

## 2017-07-16 DIAGNOSIS — R269 Unspecified abnormalities of gait and mobility: Secondary | ICD-10-CM | POA: Diagnosis not present

## 2017-07-16 DIAGNOSIS — R54 Age-related physical debility: Secondary | ICD-10-CM | POA: Diagnosis not present

## 2017-07-16 DIAGNOSIS — W19XXXA Unspecified fall, initial encounter: Secondary | ICD-10-CM | POA: Diagnosis not present

## 2017-07-16 DIAGNOSIS — M6281 Muscle weakness (generalized): Secondary | ICD-10-CM | POA: Diagnosis not present

## 2017-07-16 DIAGNOSIS — G309 Alzheimer's disease, unspecified: Secondary | ICD-10-CM | POA: Diagnosis not present

## 2017-07-17 DIAGNOSIS — M25512 Pain in left shoulder: Secondary | ICD-10-CM | POA: Diagnosis not present

## 2017-07-17 DIAGNOSIS — M79622 Pain in left upper arm: Secondary | ICD-10-CM | POA: Diagnosis not present

## 2017-07-17 DIAGNOSIS — M19012 Primary osteoarthritis, left shoulder: Secondary | ICD-10-CM | POA: Diagnosis not present

## 2017-07-17 DIAGNOSIS — S4992XA Unspecified injury of left shoulder and upper arm, initial encounter: Secondary | ICD-10-CM | POA: Diagnosis not present

## 2017-07-23 DIAGNOSIS — R269 Unspecified abnormalities of gait and mobility: Secondary | ICD-10-CM | POA: Diagnosis not present

## 2017-07-23 DIAGNOSIS — R54 Age-related physical debility: Secondary | ICD-10-CM | POA: Diagnosis not present

## 2017-07-23 DIAGNOSIS — M6281 Muscle weakness (generalized): Secondary | ICD-10-CM | POA: Diagnosis not present

## 2017-07-23 DIAGNOSIS — W19XXXS Unspecified fall, sequela: Secondary | ICD-10-CM | POA: Diagnosis not present

## 2017-07-23 DIAGNOSIS — G309 Alzheimer's disease, unspecified: Secondary | ICD-10-CM | POA: Diagnosis not present

## 2017-07-23 DIAGNOSIS — Z9181 History of falling: Secondary | ICD-10-CM | POA: Diagnosis not present

## 2017-07-29 DIAGNOSIS — M6281 Muscle weakness (generalized): Secondary | ICD-10-CM | POA: Diagnosis not present

## 2017-07-29 DIAGNOSIS — F329 Major depressive disorder, single episode, unspecified: Secondary | ICD-10-CM | POA: Diagnosis not present

## 2017-07-29 DIAGNOSIS — Z7982 Long term (current) use of aspirin: Secondary | ICD-10-CM | POA: Diagnosis not present

## 2017-07-29 DIAGNOSIS — G309 Alzheimer's disease, unspecified: Secondary | ICD-10-CM | POA: Diagnosis not present

## 2017-07-29 DIAGNOSIS — I10 Essential (primary) hypertension: Secondary | ICD-10-CM | POA: Diagnosis not present

## 2017-07-29 DIAGNOSIS — M79622 Pain in left upper arm: Secondary | ICD-10-CM | POA: Diagnosis not present

## 2017-07-29 DIAGNOSIS — Z9181 History of falling: Secondary | ICD-10-CM | POA: Diagnosis not present

## 2017-07-29 DIAGNOSIS — E119 Type 2 diabetes mellitus without complications: Secondary | ICD-10-CM | POA: Diagnosis not present

## 2017-07-29 DIAGNOSIS — R296 Repeated falls: Secondary | ICD-10-CM | POA: Diagnosis not present

## 2017-07-29 DIAGNOSIS — F0280 Dementia in other diseases classified elsewhere without behavioral disturbance: Secondary | ICD-10-CM | POA: Diagnosis not present

## 2017-07-30 DIAGNOSIS — M810 Age-related osteoporosis without current pathological fracture: Secondary | ICD-10-CM | POA: Diagnosis not present

## 2017-07-30 DIAGNOSIS — M545 Low back pain: Secondary | ICD-10-CM | POA: Diagnosis not present

## 2017-07-30 DIAGNOSIS — G309 Alzheimer's disease, unspecified: Secondary | ICD-10-CM | POA: Diagnosis not present

## 2017-07-30 DIAGNOSIS — M8588 Other specified disorders of bone density and structure, other site: Secondary | ICD-10-CM | POA: Diagnosis not present

## 2017-07-30 DIAGNOSIS — M549 Dorsalgia, unspecified: Secondary | ICD-10-CM | POA: Diagnosis not present

## 2017-07-30 DIAGNOSIS — R269 Unspecified abnormalities of gait and mobility: Secondary | ICD-10-CM | POA: Diagnosis not present

## 2017-07-30 DIAGNOSIS — S3992XA Unspecified injury of lower back, initial encounter: Secondary | ICD-10-CM | POA: Diagnosis not present

## 2017-07-30 DIAGNOSIS — M6281 Muscle weakness (generalized): Secondary | ICD-10-CM | POA: Diagnosis not present

## 2017-07-30 DIAGNOSIS — R54 Age-related physical debility: Secondary | ICD-10-CM | POA: Diagnosis not present

## 2017-07-30 DIAGNOSIS — I700 Atherosclerosis of aorta: Secondary | ICD-10-CM | POA: Diagnosis not present

## 2017-07-30 DIAGNOSIS — S22089A Unspecified fracture of T11-T12 vertebra, initial encounter for closed fracture: Secondary | ICD-10-CM | POA: Diagnosis not present

## 2017-07-30 DIAGNOSIS — Z9181 History of falling: Secondary | ICD-10-CM | POA: Diagnosis not present

## 2017-07-30 DIAGNOSIS — M1990 Unspecified osteoarthritis, unspecified site: Secondary | ICD-10-CM | POA: Diagnosis not present

## 2017-07-30 DIAGNOSIS — M546 Pain in thoracic spine: Secondary | ICD-10-CM | POA: Diagnosis not present

## 2017-07-30 DIAGNOSIS — M5136 Other intervertebral disc degeneration, lumbar region: Secondary | ICD-10-CM | POA: Diagnosis not present

## 2017-07-30 DIAGNOSIS — M47816 Spondylosis without myelopathy or radiculopathy, lumbar region: Secondary | ICD-10-CM | POA: Diagnosis not present

## 2017-07-31 DIAGNOSIS — N390 Urinary tract infection, site not specified: Secondary | ICD-10-CM | POA: Diagnosis not present

## 2017-08-06 DIAGNOSIS — M8550 Aneurysmal bone cyst, unspecified site: Secondary | ICD-10-CM | POA: Diagnosis not present

## 2017-08-06 DIAGNOSIS — R269 Unspecified abnormalities of gait and mobility: Secondary | ICD-10-CM | POA: Diagnosis not present

## 2017-08-06 DIAGNOSIS — I700 Atherosclerosis of aorta: Secondary | ICD-10-CM | POA: Diagnosis not present

## 2017-08-06 DIAGNOSIS — R296 Repeated falls: Secondary | ICD-10-CM | POA: Diagnosis not present

## 2017-08-06 DIAGNOSIS — I10 Essential (primary) hypertension: Secondary | ICD-10-CM | POA: Diagnosis not present

## 2017-08-06 DIAGNOSIS — M5135 Other intervertebral disc degeneration, thoracolumbar region: Secondary | ICD-10-CM | POA: Diagnosis not present

## 2017-08-06 DIAGNOSIS — M545 Low back pain: Secondary | ICD-10-CM | POA: Diagnosis not present

## 2017-08-06 DIAGNOSIS — M6281 Muscle weakness (generalized): Secondary | ICD-10-CM | POA: Diagnosis not present

## 2017-08-06 DIAGNOSIS — S22080A Wedge compression fracture of T11-T12 vertebra, initial encounter for closed fracture: Secondary | ICD-10-CM | POA: Diagnosis not present

## 2017-08-06 DIAGNOSIS — S32000S Wedge compression fracture of unspecified lumbar vertebra, sequela: Secondary | ICD-10-CM | POA: Diagnosis not present

## 2017-08-06 DIAGNOSIS — M79622 Pain in left upper arm: Secondary | ICD-10-CM | POA: Diagnosis not present

## 2017-08-06 DIAGNOSIS — E119 Type 2 diabetes mellitus without complications: Secondary | ICD-10-CM | POA: Diagnosis not present

## 2017-08-06 DIAGNOSIS — Z9181 History of falling: Secondary | ICD-10-CM | POA: Diagnosis not present

## 2017-08-06 DIAGNOSIS — G309 Alzheimer's disease, unspecified: Secondary | ICD-10-CM | POA: Diagnosis not present

## 2017-08-12 DIAGNOSIS — S32010A Wedge compression fracture of first lumbar vertebra, initial encounter for closed fracture: Secondary | ICD-10-CM | POA: Diagnosis not present

## 2017-08-12 DIAGNOSIS — S22080A Wedge compression fracture of T11-T12 vertebra, initial encounter for closed fracture: Secondary | ICD-10-CM | POA: Diagnosis not present

## 2017-08-18 DIAGNOSIS — L600 Ingrowing nail: Secondary | ICD-10-CM | POA: Diagnosis not present

## 2017-08-18 DIAGNOSIS — L609 Nail disorder, unspecified: Secondary | ICD-10-CM | POA: Diagnosis not present

## 2017-08-18 DIAGNOSIS — L03039 Cellulitis of unspecified toe: Secondary | ICD-10-CM | POA: Diagnosis not present

## 2017-08-19 DIAGNOSIS — N390 Urinary tract infection, site not specified: Secondary | ICD-10-CM | POA: Diagnosis not present

## 2017-08-19 DIAGNOSIS — R4182 Altered mental status, unspecified: Secondary | ICD-10-CM | POA: Diagnosis not present

## 2017-08-20 DIAGNOSIS — R54 Age-related physical debility: Secondary | ICD-10-CM | POA: Diagnosis not present

## 2017-08-20 DIAGNOSIS — R1030 Lower abdominal pain, unspecified: Secondary | ICD-10-CM | POA: Diagnosis not present

## 2017-08-20 DIAGNOSIS — R5381 Other malaise: Secondary | ICD-10-CM | POA: Diagnosis not present

## 2017-08-20 DIAGNOSIS — G309 Alzheimer's disease, unspecified: Secondary | ICD-10-CM | POA: Diagnosis not present

## 2017-08-20 DIAGNOSIS — R4182 Altered mental status, unspecified: Secondary | ICD-10-CM | POA: Diagnosis not present

## 2017-08-21 DIAGNOSIS — I10 Essential (primary) hypertension: Secondary | ICD-10-CM | POA: Diagnosis not present

## 2017-08-21 DIAGNOSIS — S32010A Wedge compression fracture of first lumbar vertebra, initial encounter for closed fracture: Secondary | ICD-10-CM | POA: Diagnosis not present

## 2017-08-21 DIAGNOSIS — Z6833 Body mass index (BMI) 33.0-33.9, adult: Secondary | ICD-10-CM | POA: Diagnosis not present

## 2017-08-21 DIAGNOSIS — S22000A Wedge compression fracture of unspecified thoracic vertebra, initial encounter for closed fracture: Secondary | ICD-10-CM | POA: Diagnosis not present

## 2017-08-27 DIAGNOSIS — R4182 Altered mental status, unspecified: Secondary | ICD-10-CM | POA: Diagnosis not present

## 2017-08-27 DIAGNOSIS — R54 Age-related physical debility: Secondary | ICD-10-CM | POA: Diagnosis not present

## 2017-08-27 DIAGNOSIS — G309 Alzheimer's disease, unspecified: Secondary | ICD-10-CM | POA: Diagnosis not present

## 2017-08-27 DIAGNOSIS — N390 Urinary tract infection, site not specified: Secondary | ICD-10-CM | POA: Diagnosis not present

## 2017-08-28 DIAGNOSIS — E119 Type 2 diabetes mellitus without complications: Secondary | ICD-10-CM | POA: Diagnosis not present

## 2017-08-28 DIAGNOSIS — Z79899 Other long term (current) drug therapy: Secondary | ICD-10-CM | POA: Diagnosis not present

## 2017-08-28 DIAGNOSIS — D518 Other vitamin B12 deficiency anemias: Secondary | ICD-10-CM | POA: Diagnosis not present

## 2017-08-28 DIAGNOSIS — E7849 Other hyperlipidemia: Secondary | ICD-10-CM | POA: Diagnosis not present

## 2017-09-03 DIAGNOSIS — I10 Essential (primary) hypertension: Secondary | ICD-10-CM | POA: Diagnosis not present

## 2017-09-03 DIAGNOSIS — M79622 Pain in left upper arm: Secondary | ICD-10-CM | POA: Diagnosis not present

## 2017-09-03 DIAGNOSIS — M6281 Muscle weakness (generalized): Secondary | ICD-10-CM | POA: Diagnosis not present

## 2017-09-03 DIAGNOSIS — E119 Type 2 diabetes mellitus without complications: Secondary | ICD-10-CM | POA: Diagnosis not present

## 2017-09-03 DIAGNOSIS — G309 Alzheimer's disease, unspecified: Secondary | ICD-10-CM | POA: Diagnosis not present

## 2017-09-03 DIAGNOSIS — R296 Repeated falls: Secondary | ICD-10-CM | POA: Diagnosis not present

## 2017-10-01 DIAGNOSIS — N183 Chronic kidney disease, stage 3 (moderate): Secondary | ICD-10-CM | POA: Diagnosis not present

## 2017-10-01 DIAGNOSIS — G309 Alzheimer's disease, unspecified: Secondary | ICD-10-CM | POA: Diagnosis not present

## 2017-10-01 DIAGNOSIS — I129 Hypertensive chronic kidney disease with stage 1 through stage 4 chronic kidney disease, or unspecified chronic kidney disease: Secondary | ICD-10-CM | POA: Diagnosis not present

## 2017-10-01 DIAGNOSIS — E1122 Type 2 diabetes mellitus with diabetic chronic kidney disease: Secondary | ICD-10-CM | POA: Diagnosis not present

## 2017-10-01 DIAGNOSIS — G894 Chronic pain syndrome: Secondary | ICD-10-CM | POA: Diagnosis not present

## 2017-10-02 DIAGNOSIS — E038 Other specified hypothyroidism: Secondary | ICD-10-CM | POA: Diagnosis not present

## 2017-10-02 DIAGNOSIS — E7849 Other hyperlipidemia: Secondary | ICD-10-CM | POA: Diagnosis not present

## 2017-10-02 DIAGNOSIS — E559 Vitamin D deficiency, unspecified: Secondary | ICD-10-CM | POA: Diagnosis not present

## 2017-10-02 DIAGNOSIS — D518 Other vitamin B12 deficiency anemias: Secondary | ICD-10-CM | POA: Diagnosis not present

## 2017-10-02 DIAGNOSIS — E119 Type 2 diabetes mellitus without complications: Secondary | ICD-10-CM | POA: Diagnosis not present

## 2017-10-02 DIAGNOSIS — Z79899 Other long term (current) drug therapy: Secondary | ICD-10-CM | POA: Diagnosis not present

## 2017-10-06 DIAGNOSIS — S22000A Wedge compression fracture of unspecified thoracic vertebra, initial encounter for closed fracture: Secondary | ICD-10-CM | POA: Diagnosis not present

## 2017-10-06 DIAGNOSIS — S22000G Wedge compression fracture of unspecified thoracic vertebra, subsequent encounter for fracture with delayed healing: Secondary | ICD-10-CM | POA: Diagnosis not present

## 2017-10-08 DIAGNOSIS — E1165 Type 2 diabetes mellitus with hyperglycemia: Secondary | ICD-10-CM | POA: Diagnosis not present

## 2017-10-08 DIAGNOSIS — G309 Alzheimer's disease, unspecified: Secondary | ICD-10-CM | POA: Diagnosis not present

## 2017-10-08 DIAGNOSIS — R799 Abnormal finding of blood chemistry, unspecified: Secondary | ICD-10-CM | POA: Diagnosis not present

## 2017-10-13 DIAGNOSIS — A4151 Sepsis due to Escherichia coli [E. coli]: Secondary | ICD-10-CM | POA: Diagnosis not present

## 2017-10-13 DIAGNOSIS — N3001 Acute cystitis with hematuria: Secondary | ICD-10-CM | POA: Diagnosis not present

## 2017-10-13 DIAGNOSIS — B9620 Unspecified Escherichia coli [E. coli] as the cause of diseases classified elsewhere: Secondary | ICD-10-CM | POA: Diagnosis not present

## 2017-10-13 DIAGNOSIS — F0390 Unspecified dementia without behavioral disturbance: Secondary | ICD-10-CM | POA: Diagnosis not present

## 2017-10-13 DIAGNOSIS — R0902 Hypoxemia: Secondary | ICD-10-CM | POA: Diagnosis not present

## 2017-10-13 DIAGNOSIS — G9341 Metabolic encephalopathy: Secondary | ICD-10-CM | POA: Diagnosis not present

## 2017-10-13 DIAGNOSIS — E7800 Pure hypercholesterolemia, unspecified: Secondary | ICD-10-CM | POA: Diagnosis not present

## 2017-10-13 DIAGNOSIS — N179 Acute kidney failure, unspecified: Secondary | ICD-10-CM | POA: Diagnosis not present

## 2017-10-13 DIAGNOSIS — A419 Sepsis, unspecified organism: Secondary | ICD-10-CM | POA: Diagnosis not present

## 2017-10-13 DIAGNOSIS — R402441 Other coma, without documented Glasgow coma scale score, or with partial score reported, in the field [EMT or ambulance]: Secondary | ICD-10-CM | POA: Diagnosis not present

## 2017-10-13 DIAGNOSIS — R05 Cough: Secondary | ICD-10-CM | POA: Diagnosis not present

## 2017-10-14 DIAGNOSIS — I129 Hypertensive chronic kidney disease with stage 1 through stage 4 chronic kidney disease, or unspecified chronic kidney disease: Secondary | ICD-10-CM | POA: Diagnosis present

## 2017-10-14 DIAGNOSIS — M1990 Unspecified osteoarthritis, unspecified site: Secondary | ICD-10-CM | POA: Diagnosis present

## 2017-10-14 DIAGNOSIS — N390 Urinary tract infection, site not specified: Secondary | ICD-10-CM | POA: Diagnosis not present

## 2017-10-14 DIAGNOSIS — E1122 Type 2 diabetes mellitus with diabetic chronic kidney disease: Secondary | ICD-10-CM | POA: Diagnosis present

## 2017-10-14 DIAGNOSIS — N183 Chronic kidney disease, stage 3 (moderate): Secondary | ICD-10-CM | POA: Diagnosis present

## 2017-10-14 DIAGNOSIS — Z8659 Personal history of other mental and behavioral disorders: Secondary | ICD-10-CM | POA: Diagnosis not present

## 2017-10-14 DIAGNOSIS — A4151 Sepsis due to Escherichia coli [E. coli]: Secondary | ICD-10-CM | POA: Diagnosis not present

## 2017-10-14 DIAGNOSIS — Z7984 Long term (current) use of oral hypoglycemic drugs: Secondary | ICD-10-CM | POA: Diagnosis not present

## 2017-10-14 DIAGNOSIS — A419 Sepsis, unspecified organism: Secondary | ICD-10-CM | POA: Diagnosis not present

## 2017-10-14 DIAGNOSIS — G9341 Metabolic encephalopathy: Secondary | ICD-10-CM | POA: Diagnosis not present

## 2017-10-14 DIAGNOSIS — N3001 Acute cystitis with hematuria: Secondary | ICD-10-CM | POA: Diagnosis not present

## 2017-10-14 DIAGNOSIS — G4700 Insomnia, unspecified: Secondary | ICD-10-CM | POA: Diagnosis present

## 2017-10-14 DIAGNOSIS — F0390 Unspecified dementia without behavioral disturbance: Secondary | ICD-10-CM | POA: Diagnosis not present

## 2017-10-14 DIAGNOSIS — Z8673 Personal history of transient ischemic attack (TIA), and cerebral infarction without residual deficits: Secondary | ICD-10-CM | POA: Diagnosis not present

## 2017-10-14 DIAGNOSIS — R05 Cough: Secondary | ICD-10-CM | POA: Diagnosis not present

## 2017-10-14 DIAGNOSIS — R531 Weakness: Secondary | ICD-10-CM | POA: Diagnosis not present

## 2017-10-14 DIAGNOSIS — D649 Anemia, unspecified: Secondary | ICD-10-CM | POA: Diagnosis present

## 2017-10-14 DIAGNOSIS — R509 Fever, unspecified: Secondary | ICD-10-CM | POA: Diagnosis not present

## 2017-10-14 DIAGNOSIS — R279 Unspecified lack of coordination: Secondary | ICD-10-CM | POA: Diagnosis not present

## 2017-10-14 DIAGNOSIS — B9620 Unspecified Escherichia coli [E. coli] as the cause of diseases classified elsewhere: Secondary | ICD-10-CM | POA: Diagnosis not present

## 2017-10-14 DIAGNOSIS — Z7982 Long term (current) use of aspirin: Secondary | ICD-10-CM | POA: Diagnosis not present

## 2017-10-14 DIAGNOSIS — E7800 Pure hypercholesterolemia, unspecified: Secondary | ICD-10-CM | POA: Diagnosis not present

## 2017-10-14 DIAGNOSIS — N179 Acute kidney failure, unspecified: Secondary | ICD-10-CM | POA: Diagnosis not present

## 2017-10-14 DIAGNOSIS — Z79899 Other long term (current) drug therapy: Secondary | ICD-10-CM | POA: Diagnosis not present

## 2017-10-14 DIAGNOSIS — Z7401 Bed confinement status: Secondary | ICD-10-CM | POA: Diagnosis not present

## 2017-10-14 DIAGNOSIS — R0902 Hypoxemia: Secondary | ICD-10-CM | POA: Diagnosis not present

## 2017-10-17 DIAGNOSIS — R279 Unspecified lack of coordination: Secondary | ICD-10-CM | POA: Diagnosis not present

## 2017-10-17 DIAGNOSIS — G309 Alzheimer's disease, unspecified: Secondary | ICD-10-CM | POA: Diagnosis not present

## 2017-10-17 DIAGNOSIS — A419 Sepsis, unspecified organism: Secondary | ICD-10-CM | POA: Diagnosis not present

## 2017-10-17 DIAGNOSIS — M6281 Muscle weakness (generalized): Secondary | ICD-10-CM | POA: Diagnosis not present

## 2017-10-17 DIAGNOSIS — F0390 Unspecified dementia without behavioral disturbance: Secondary | ICD-10-CM | POA: Diagnosis not present

## 2017-10-17 DIAGNOSIS — B9620 Unspecified Escherichia coli [E. coli] as the cause of diseases classified elsewhere: Secondary | ICD-10-CM | POA: Diagnosis not present

## 2017-10-17 DIAGNOSIS — Z7401 Bed confinement status: Secondary | ICD-10-CM | POA: Diagnosis not present

## 2017-10-17 DIAGNOSIS — A4151 Sepsis due to Escherichia coli [E. coli]: Secondary | ICD-10-CM | POA: Diagnosis not present

## 2017-10-17 DIAGNOSIS — G9341 Metabolic encephalopathy: Secondary | ICD-10-CM | POA: Diagnosis not present

## 2017-10-17 DIAGNOSIS — N390 Urinary tract infection, site not specified: Secondary | ICD-10-CM | POA: Diagnosis not present

## 2017-10-17 DIAGNOSIS — R531 Weakness: Secondary | ICD-10-CM | POA: Diagnosis not present

## 2017-10-17 DIAGNOSIS — R269 Unspecified abnormalities of gait and mobility: Secondary | ICD-10-CM | POA: Diagnosis not present

## 2017-10-17 DIAGNOSIS — E7800 Pure hypercholesterolemia, unspecified: Secondary | ICD-10-CM | POA: Diagnosis not present

## 2017-10-17 DIAGNOSIS — R0902 Hypoxemia: Secondary | ICD-10-CM | POA: Diagnosis not present

## 2017-10-17 DIAGNOSIS — E119 Type 2 diabetes mellitus without complications: Secondary | ICD-10-CM | POA: Diagnosis not present

## 2017-10-17 DIAGNOSIS — N179 Acute kidney failure, unspecified: Secondary | ICD-10-CM | POA: Diagnosis not present

## 2017-10-17 DIAGNOSIS — R54 Age-related physical debility: Secondary | ICD-10-CM | POA: Diagnosis not present

## 2017-10-17 DIAGNOSIS — R509 Fever, unspecified: Secondary | ICD-10-CM | POA: Diagnosis not present

## 2017-10-17 DIAGNOSIS — N3001 Acute cystitis with hematuria: Secondary | ICD-10-CM | POA: Diagnosis not present

## 2017-10-17 DIAGNOSIS — I10 Essential (primary) hypertension: Secondary | ICD-10-CM | POA: Diagnosis not present

## 2017-10-29 DIAGNOSIS — E119 Type 2 diabetes mellitus without complications: Secondary | ICD-10-CM | POA: Diagnosis not present

## 2017-10-29 DIAGNOSIS — R269 Unspecified abnormalities of gait and mobility: Secondary | ICD-10-CM | POA: Diagnosis not present

## 2017-10-29 DIAGNOSIS — M6281 Muscle weakness (generalized): Secondary | ICD-10-CM | POA: Diagnosis not present

## 2017-10-29 DIAGNOSIS — R54 Age-related physical debility: Secondary | ICD-10-CM | POA: Diagnosis not present

## 2017-10-29 DIAGNOSIS — I10 Essential (primary) hypertension: Secondary | ICD-10-CM | POA: Diagnosis not present

## 2017-10-29 DIAGNOSIS — N390 Urinary tract infection, site not specified: Secondary | ICD-10-CM | POA: Diagnosis not present

## 2017-10-29 DIAGNOSIS — G309 Alzheimer's disease, unspecified: Secondary | ICD-10-CM | POA: Diagnosis not present

## 2017-10-31 DIAGNOSIS — N183 Chronic kidney disease, stage 3 (moderate): Secondary | ICD-10-CM | POA: Diagnosis not present

## 2017-10-31 DIAGNOSIS — E1122 Type 2 diabetes mellitus with diabetic chronic kidney disease: Secondary | ICD-10-CM | POA: Diagnosis not present

## 2017-10-31 DIAGNOSIS — Z7982 Long term (current) use of aspirin: Secondary | ICD-10-CM | POA: Diagnosis not present

## 2017-10-31 DIAGNOSIS — F419 Anxiety disorder, unspecified: Secondary | ICD-10-CM | POA: Diagnosis not present

## 2017-10-31 DIAGNOSIS — F0280 Dementia in other diseases classified elsewhere without behavioral disturbance: Secondary | ICD-10-CM | POA: Diagnosis not present

## 2017-10-31 DIAGNOSIS — G309 Alzheimer's disease, unspecified: Secondary | ICD-10-CM | POA: Diagnosis not present

## 2017-10-31 DIAGNOSIS — I129 Hypertensive chronic kidney disease with stage 1 through stage 4 chronic kidney disease, or unspecified chronic kidney disease: Secondary | ICD-10-CM | POA: Diagnosis not present

## 2017-10-31 DIAGNOSIS — F329 Major depressive disorder, single episode, unspecified: Secondary | ICD-10-CM | POA: Diagnosis not present

## 2017-10-31 DIAGNOSIS — M1991 Primary osteoarthritis, unspecified site: Secondary | ICD-10-CM | POA: Diagnosis not present

## 2017-10-31 DIAGNOSIS — R296 Repeated falls: Secondary | ICD-10-CM | POA: Diagnosis not present

## 2017-10-31 DIAGNOSIS — Z9181 History of falling: Secondary | ICD-10-CM | POA: Diagnosis not present

## 2017-10-31 DIAGNOSIS — D649 Anemia, unspecified: Secondary | ICD-10-CM | POA: Diagnosis not present

## 2017-11-02 DIAGNOSIS — I129 Hypertensive chronic kidney disease with stage 1 through stage 4 chronic kidney disease, or unspecified chronic kidney disease: Secondary | ICD-10-CM | POA: Diagnosis not present

## 2017-11-02 DIAGNOSIS — N183 Chronic kidney disease, stage 3 (moderate): Secondary | ICD-10-CM | POA: Diagnosis not present

## 2017-11-02 DIAGNOSIS — F329 Major depressive disorder, single episode, unspecified: Secondary | ICD-10-CM | POA: Diagnosis not present

## 2017-11-02 DIAGNOSIS — E1122 Type 2 diabetes mellitus with diabetic chronic kidney disease: Secondary | ICD-10-CM | POA: Diagnosis not present

## 2017-11-02 DIAGNOSIS — G309 Alzheimer's disease, unspecified: Secondary | ICD-10-CM | POA: Diagnosis not present

## 2017-11-02 DIAGNOSIS — F0280 Dementia in other diseases classified elsewhere without behavioral disturbance: Secondary | ICD-10-CM | POA: Diagnosis not present

## 2017-11-04 DIAGNOSIS — F329 Major depressive disorder, single episode, unspecified: Secondary | ICD-10-CM | POA: Diagnosis not present

## 2017-11-04 DIAGNOSIS — G309 Alzheimer's disease, unspecified: Secondary | ICD-10-CM | POA: Diagnosis not present

## 2017-11-04 DIAGNOSIS — F0280 Dementia in other diseases classified elsewhere without behavioral disturbance: Secondary | ICD-10-CM | POA: Diagnosis not present

## 2017-11-04 DIAGNOSIS — E1122 Type 2 diabetes mellitus with diabetic chronic kidney disease: Secondary | ICD-10-CM | POA: Diagnosis not present

## 2017-11-04 DIAGNOSIS — N183 Chronic kidney disease, stage 3 (moderate): Secondary | ICD-10-CM | POA: Diagnosis not present

## 2017-11-04 DIAGNOSIS — I129 Hypertensive chronic kidney disease with stage 1 through stage 4 chronic kidney disease, or unspecified chronic kidney disease: Secondary | ICD-10-CM | POA: Diagnosis not present

## 2017-11-05 DIAGNOSIS — R54 Age-related physical debility: Secondary | ICD-10-CM | POA: Diagnosis not present

## 2017-11-05 DIAGNOSIS — G309 Alzheimer's disease, unspecified: Secondary | ICD-10-CM | POA: Diagnosis not present

## 2017-11-05 DIAGNOSIS — Z87440 Personal history of urinary (tract) infections: Secondary | ICD-10-CM | POA: Diagnosis not present

## 2017-11-05 DIAGNOSIS — R3981 Functional urinary incontinence: Secondary | ICD-10-CM | POA: Diagnosis not present

## 2017-11-05 DIAGNOSIS — R4182 Altered mental status, unspecified: Secondary | ICD-10-CM | POA: Diagnosis not present

## 2017-11-05 DIAGNOSIS — M545 Low back pain: Secondary | ICD-10-CM | POA: Diagnosis not present

## 2017-11-05 DIAGNOSIS — R350 Frequency of micturition: Secondary | ICD-10-CM | POA: Diagnosis not present

## 2017-11-05 DIAGNOSIS — R5381 Other malaise: Secondary | ICD-10-CM | POA: Diagnosis not present

## 2017-11-06 DIAGNOSIS — F0280 Dementia in other diseases classified elsewhere without behavioral disturbance: Secondary | ICD-10-CM | POA: Diagnosis not present

## 2017-11-06 DIAGNOSIS — N390 Urinary tract infection, site not specified: Secondary | ICD-10-CM | POA: Diagnosis not present

## 2017-11-06 DIAGNOSIS — E1122 Type 2 diabetes mellitus with diabetic chronic kidney disease: Secondary | ICD-10-CM | POA: Diagnosis not present

## 2017-11-06 DIAGNOSIS — G309 Alzheimer's disease, unspecified: Secondary | ICD-10-CM | POA: Diagnosis not present

## 2017-11-06 DIAGNOSIS — I129 Hypertensive chronic kidney disease with stage 1 through stage 4 chronic kidney disease, or unspecified chronic kidney disease: Secondary | ICD-10-CM | POA: Diagnosis not present

## 2017-11-06 DIAGNOSIS — F329 Major depressive disorder, single episode, unspecified: Secondary | ICD-10-CM | POA: Diagnosis not present

## 2017-11-06 DIAGNOSIS — N183 Chronic kidney disease, stage 3 (moderate): Secondary | ICD-10-CM | POA: Diagnosis not present

## 2017-11-09 DIAGNOSIS — I129 Hypertensive chronic kidney disease with stage 1 through stage 4 chronic kidney disease, or unspecified chronic kidney disease: Secondary | ICD-10-CM | POA: Diagnosis not present

## 2017-11-09 DIAGNOSIS — N183 Chronic kidney disease, stage 3 (moderate): Secondary | ICD-10-CM | POA: Diagnosis not present

## 2017-11-09 DIAGNOSIS — G309 Alzheimer's disease, unspecified: Secondary | ICD-10-CM | POA: Diagnosis not present

## 2017-11-09 DIAGNOSIS — E1122 Type 2 diabetes mellitus with diabetic chronic kidney disease: Secondary | ICD-10-CM | POA: Diagnosis not present

## 2017-11-09 DIAGNOSIS — F329 Major depressive disorder, single episode, unspecified: Secondary | ICD-10-CM | POA: Diagnosis not present

## 2017-11-09 DIAGNOSIS — F0280 Dementia in other diseases classified elsewhere without behavioral disturbance: Secondary | ICD-10-CM | POA: Diagnosis not present

## 2017-11-10 DIAGNOSIS — N183 Chronic kidney disease, stage 3 (moderate): Secondary | ICD-10-CM | POA: Diagnosis not present

## 2017-11-10 DIAGNOSIS — F0280 Dementia in other diseases classified elsewhere without behavioral disturbance: Secondary | ICD-10-CM | POA: Diagnosis not present

## 2017-11-10 DIAGNOSIS — E1122 Type 2 diabetes mellitus with diabetic chronic kidney disease: Secondary | ICD-10-CM | POA: Diagnosis not present

## 2017-11-10 DIAGNOSIS — G309 Alzheimer's disease, unspecified: Secondary | ICD-10-CM | POA: Diagnosis not present

## 2017-11-10 DIAGNOSIS — I129 Hypertensive chronic kidney disease with stage 1 through stage 4 chronic kidney disease, or unspecified chronic kidney disease: Secondary | ICD-10-CM | POA: Diagnosis not present

## 2017-11-10 DIAGNOSIS — F329 Major depressive disorder, single episode, unspecified: Secondary | ICD-10-CM | POA: Diagnosis not present

## 2017-11-11 DIAGNOSIS — G309 Alzheimer's disease, unspecified: Secondary | ICD-10-CM | POA: Diagnosis not present

## 2017-11-11 DIAGNOSIS — F0280 Dementia in other diseases classified elsewhere without behavioral disturbance: Secondary | ICD-10-CM | POA: Diagnosis not present

## 2017-11-11 DIAGNOSIS — F329 Major depressive disorder, single episode, unspecified: Secondary | ICD-10-CM | POA: Diagnosis not present

## 2017-11-11 DIAGNOSIS — N183 Chronic kidney disease, stage 3 (moderate): Secondary | ICD-10-CM | POA: Diagnosis not present

## 2017-11-11 DIAGNOSIS — I129 Hypertensive chronic kidney disease with stage 1 through stage 4 chronic kidney disease, or unspecified chronic kidney disease: Secondary | ICD-10-CM | POA: Diagnosis not present

## 2017-11-11 DIAGNOSIS — E1122 Type 2 diabetes mellitus with diabetic chronic kidney disease: Secondary | ICD-10-CM | POA: Diagnosis not present

## 2017-11-12 DIAGNOSIS — F0280 Dementia in other diseases classified elsewhere without behavioral disturbance: Secondary | ICD-10-CM | POA: Diagnosis not present

## 2017-11-12 DIAGNOSIS — E1122 Type 2 diabetes mellitus with diabetic chronic kidney disease: Secondary | ICD-10-CM | POA: Diagnosis not present

## 2017-11-12 DIAGNOSIS — I129 Hypertensive chronic kidney disease with stage 1 through stage 4 chronic kidney disease, or unspecified chronic kidney disease: Secondary | ICD-10-CM | POA: Diagnosis not present

## 2017-11-12 DIAGNOSIS — F329 Major depressive disorder, single episode, unspecified: Secondary | ICD-10-CM | POA: Diagnosis not present

## 2017-11-12 DIAGNOSIS — G309 Alzheimer's disease, unspecified: Secondary | ICD-10-CM | POA: Diagnosis not present

## 2017-11-12 DIAGNOSIS — N183 Chronic kidney disease, stage 3 (moderate): Secondary | ICD-10-CM | POA: Diagnosis not present

## 2017-11-13 DIAGNOSIS — N183 Chronic kidney disease, stage 3 (moderate): Secondary | ICD-10-CM | POA: Diagnosis not present

## 2017-11-13 DIAGNOSIS — F0280 Dementia in other diseases classified elsewhere without behavioral disturbance: Secondary | ICD-10-CM | POA: Diagnosis not present

## 2017-11-13 DIAGNOSIS — E1122 Type 2 diabetes mellitus with diabetic chronic kidney disease: Secondary | ICD-10-CM | POA: Diagnosis not present

## 2017-11-13 DIAGNOSIS — G309 Alzheimer's disease, unspecified: Secondary | ICD-10-CM | POA: Diagnosis not present

## 2017-11-13 DIAGNOSIS — F329 Major depressive disorder, single episode, unspecified: Secondary | ICD-10-CM | POA: Diagnosis not present

## 2017-11-13 DIAGNOSIS — I129 Hypertensive chronic kidney disease with stage 1 through stage 4 chronic kidney disease, or unspecified chronic kidney disease: Secondary | ICD-10-CM | POA: Diagnosis not present

## 2017-11-17 DIAGNOSIS — L609 Nail disorder, unspecified: Secondary | ICD-10-CM | POA: Diagnosis not present

## 2017-11-17 DIAGNOSIS — I129 Hypertensive chronic kidney disease with stage 1 through stage 4 chronic kidney disease, or unspecified chronic kidney disease: Secondary | ICD-10-CM | POA: Diagnosis not present

## 2017-11-17 DIAGNOSIS — F0280 Dementia in other diseases classified elsewhere without behavioral disturbance: Secondary | ICD-10-CM | POA: Diagnosis not present

## 2017-11-17 DIAGNOSIS — N183 Chronic kidney disease, stage 3 (moderate): Secondary | ICD-10-CM | POA: Diagnosis not present

## 2017-11-17 DIAGNOSIS — G309 Alzheimer's disease, unspecified: Secondary | ICD-10-CM | POA: Diagnosis not present

## 2017-11-17 DIAGNOSIS — E1122 Type 2 diabetes mellitus with diabetic chronic kidney disease: Secondary | ICD-10-CM | POA: Diagnosis not present

## 2017-11-17 DIAGNOSIS — F329 Major depressive disorder, single episode, unspecified: Secondary | ICD-10-CM | POA: Diagnosis not present

## 2017-11-19 DIAGNOSIS — I129 Hypertensive chronic kidney disease with stage 1 through stage 4 chronic kidney disease, or unspecified chronic kidney disease: Secondary | ICD-10-CM | POA: Diagnosis not present

## 2017-11-19 DIAGNOSIS — G309 Alzheimer's disease, unspecified: Secondary | ICD-10-CM | POA: Diagnosis not present

## 2017-11-19 DIAGNOSIS — E1122 Type 2 diabetes mellitus with diabetic chronic kidney disease: Secondary | ICD-10-CM | POA: Diagnosis not present

## 2017-11-19 DIAGNOSIS — F0280 Dementia in other diseases classified elsewhere without behavioral disturbance: Secondary | ICD-10-CM | POA: Diagnosis not present

## 2017-11-19 DIAGNOSIS — N183 Chronic kidney disease, stage 3 (moderate): Secondary | ICD-10-CM | POA: Diagnosis not present

## 2017-11-19 DIAGNOSIS — F329 Major depressive disorder, single episode, unspecified: Secondary | ICD-10-CM | POA: Diagnosis not present

## 2017-11-20 DIAGNOSIS — I129 Hypertensive chronic kidney disease with stage 1 through stage 4 chronic kidney disease, or unspecified chronic kidney disease: Secondary | ICD-10-CM | POA: Diagnosis not present

## 2017-11-20 DIAGNOSIS — N183 Chronic kidney disease, stage 3 (moderate): Secondary | ICD-10-CM | POA: Diagnosis not present

## 2017-11-20 DIAGNOSIS — E1122 Type 2 diabetes mellitus with diabetic chronic kidney disease: Secondary | ICD-10-CM | POA: Diagnosis not present

## 2017-11-20 DIAGNOSIS — G309 Alzheimer's disease, unspecified: Secondary | ICD-10-CM | POA: Diagnosis not present

## 2017-11-20 DIAGNOSIS — F0280 Dementia in other diseases classified elsewhere without behavioral disturbance: Secondary | ICD-10-CM | POA: Diagnosis not present

## 2017-11-20 DIAGNOSIS — F329 Major depressive disorder, single episode, unspecified: Secondary | ICD-10-CM | POA: Diagnosis not present

## 2017-11-23 DIAGNOSIS — G309 Alzheimer's disease, unspecified: Secondary | ICD-10-CM | POA: Diagnosis not present

## 2017-11-23 DIAGNOSIS — N183 Chronic kidney disease, stage 3 (moderate): Secondary | ICD-10-CM | POA: Diagnosis not present

## 2017-11-23 DIAGNOSIS — F0280 Dementia in other diseases classified elsewhere without behavioral disturbance: Secondary | ICD-10-CM | POA: Diagnosis not present

## 2017-11-23 DIAGNOSIS — I129 Hypertensive chronic kidney disease with stage 1 through stage 4 chronic kidney disease, or unspecified chronic kidney disease: Secondary | ICD-10-CM | POA: Diagnosis not present

## 2017-11-23 DIAGNOSIS — E1122 Type 2 diabetes mellitus with diabetic chronic kidney disease: Secondary | ICD-10-CM | POA: Diagnosis not present

## 2017-11-23 DIAGNOSIS — F329 Major depressive disorder, single episode, unspecified: Secondary | ICD-10-CM | POA: Diagnosis not present

## 2017-11-24 DIAGNOSIS — N183 Chronic kidney disease, stage 3 (moderate): Secondary | ICD-10-CM | POA: Diagnosis not present

## 2017-11-24 DIAGNOSIS — F0280 Dementia in other diseases classified elsewhere without behavioral disturbance: Secondary | ICD-10-CM | POA: Diagnosis not present

## 2017-11-24 DIAGNOSIS — E1122 Type 2 diabetes mellitus with diabetic chronic kidney disease: Secondary | ICD-10-CM | POA: Diagnosis not present

## 2017-11-24 DIAGNOSIS — G309 Alzheimer's disease, unspecified: Secondary | ICD-10-CM | POA: Diagnosis not present

## 2017-11-24 DIAGNOSIS — F329 Major depressive disorder, single episode, unspecified: Secondary | ICD-10-CM | POA: Diagnosis not present

## 2017-11-24 DIAGNOSIS — I129 Hypertensive chronic kidney disease with stage 1 through stage 4 chronic kidney disease, or unspecified chronic kidney disease: Secondary | ICD-10-CM | POA: Diagnosis not present

## 2017-11-30 DIAGNOSIS — E1122 Type 2 diabetes mellitus with diabetic chronic kidney disease: Secondary | ICD-10-CM | POA: Diagnosis not present

## 2017-11-30 DIAGNOSIS — I129 Hypertensive chronic kidney disease with stage 1 through stage 4 chronic kidney disease, or unspecified chronic kidney disease: Secondary | ICD-10-CM | POA: Diagnosis not present

## 2017-11-30 DIAGNOSIS — F329 Major depressive disorder, single episode, unspecified: Secondary | ICD-10-CM | POA: Diagnosis not present

## 2017-11-30 DIAGNOSIS — F0280 Dementia in other diseases classified elsewhere without behavioral disturbance: Secondary | ICD-10-CM | POA: Diagnosis not present

## 2017-11-30 DIAGNOSIS — N183 Chronic kidney disease, stage 3 (moderate): Secondary | ICD-10-CM | POA: Diagnosis not present

## 2017-11-30 DIAGNOSIS — G309 Alzheimer's disease, unspecified: Secondary | ICD-10-CM | POA: Diagnosis not present

## 2017-12-03 DIAGNOSIS — G309 Alzheimer's disease, unspecified: Secondary | ICD-10-CM | POA: Diagnosis not present

## 2017-12-03 DIAGNOSIS — Z87440 Personal history of urinary (tract) infections: Secondary | ICD-10-CM | POA: Diagnosis not present

## 2017-12-03 DIAGNOSIS — R54 Age-related physical debility: Secondary | ICD-10-CM | POA: Diagnosis not present

## 2017-12-07 DIAGNOSIS — E1122 Type 2 diabetes mellitus with diabetic chronic kidney disease: Secondary | ICD-10-CM | POA: Diagnosis not present

## 2017-12-07 DIAGNOSIS — F0280 Dementia in other diseases classified elsewhere without behavioral disturbance: Secondary | ICD-10-CM | POA: Diagnosis not present

## 2017-12-07 DIAGNOSIS — N183 Chronic kidney disease, stage 3 (moderate): Secondary | ICD-10-CM | POA: Diagnosis not present

## 2017-12-07 DIAGNOSIS — G309 Alzheimer's disease, unspecified: Secondary | ICD-10-CM | POA: Diagnosis not present

## 2017-12-07 DIAGNOSIS — I129 Hypertensive chronic kidney disease with stage 1 through stage 4 chronic kidney disease, or unspecified chronic kidney disease: Secondary | ICD-10-CM | POA: Diagnosis not present

## 2017-12-07 DIAGNOSIS — F329 Major depressive disorder, single episode, unspecified: Secondary | ICD-10-CM | POA: Diagnosis not present

## 2017-12-10 DIAGNOSIS — E119 Type 2 diabetes mellitus without complications: Secondary | ICD-10-CM | POA: Diagnosis not present

## 2017-12-10 DIAGNOSIS — G309 Alzheimer's disease, unspecified: Secondary | ICD-10-CM | POA: Diagnosis not present

## 2017-12-10 DIAGNOSIS — E162 Hypoglycemia, unspecified: Secondary | ICD-10-CM | POA: Diagnosis not present

## 2017-12-10 DIAGNOSIS — R54 Age-related physical debility: Secondary | ICD-10-CM | POA: Diagnosis not present

## 2017-12-11 DIAGNOSIS — E119 Type 2 diabetes mellitus without complications: Secondary | ICD-10-CM | POA: Diagnosis not present

## 2017-12-11 DIAGNOSIS — Z79899 Other long term (current) drug therapy: Secondary | ICD-10-CM | POA: Diagnosis not present

## 2017-12-17 DIAGNOSIS — R54 Age-related physical debility: Secondary | ICD-10-CM | POA: Diagnosis not present

## 2017-12-17 DIAGNOSIS — E119 Type 2 diabetes mellitus without complications: Secondary | ICD-10-CM | POA: Diagnosis not present

## 2017-12-17 DIAGNOSIS — G309 Alzheimer's disease, unspecified: Secondary | ICD-10-CM | POA: Diagnosis not present

## 2018-01-08 DIAGNOSIS — Z79899 Other long term (current) drug therapy: Secondary | ICD-10-CM | POA: Diagnosis not present

## 2018-01-08 DIAGNOSIS — E119 Type 2 diabetes mellitus without complications: Secondary | ICD-10-CM | POA: Diagnosis not present

## 2018-01-15 DIAGNOSIS — N390 Urinary tract infection, site not specified: Secondary | ICD-10-CM | POA: Diagnosis not present

## 2018-01-18 DIAGNOSIS — N3091 Cystitis, unspecified with hematuria: Secondary | ICD-10-CM | POA: Diagnosis not present

## 2018-01-18 DIAGNOSIS — N3001 Acute cystitis with hematuria: Secondary | ICD-10-CM | POA: Diagnosis not present

## 2018-01-23 DIAGNOSIS — M1991 Primary osteoarthritis, unspecified site: Secondary | ICD-10-CM | POA: Diagnosis not present

## 2018-01-23 DIAGNOSIS — N183 Chronic kidney disease, stage 3 (moderate): Secondary | ICD-10-CM | POA: Diagnosis not present

## 2018-01-23 DIAGNOSIS — G309 Alzheimer's disease, unspecified: Secondary | ICD-10-CM | POA: Diagnosis not present

## 2018-01-23 DIAGNOSIS — F419 Anxiety disorder, unspecified: Secondary | ICD-10-CM | POA: Diagnosis not present

## 2018-01-23 DIAGNOSIS — I129 Hypertensive chronic kidney disease with stage 1 through stage 4 chronic kidney disease, or unspecified chronic kidney disease: Secondary | ICD-10-CM | POA: Diagnosis not present

## 2018-01-23 DIAGNOSIS — E1122 Type 2 diabetes mellitus with diabetic chronic kidney disease: Secondary | ICD-10-CM | POA: Diagnosis not present

## 2018-01-23 DIAGNOSIS — F329 Major depressive disorder, single episode, unspecified: Secondary | ICD-10-CM | POA: Diagnosis not present

## 2018-01-23 DIAGNOSIS — Z9181 History of falling: Secondary | ICD-10-CM | POA: Diagnosis not present

## 2018-01-23 DIAGNOSIS — Z7982 Long term (current) use of aspirin: Secondary | ICD-10-CM | POA: Diagnosis not present

## 2018-01-23 DIAGNOSIS — R296 Repeated falls: Secondary | ICD-10-CM | POA: Diagnosis not present

## 2018-01-23 DIAGNOSIS — F0280 Dementia in other diseases classified elsewhere without behavioral disturbance: Secondary | ICD-10-CM | POA: Diagnosis not present

## 2018-01-23 DIAGNOSIS — N390 Urinary tract infection, site not specified: Secondary | ICD-10-CM | POA: Diagnosis not present

## 2018-01-23 DIAGNOSIS — D649 Anemia, unspecified: Secondary | ICD-10-CM | POA: Diagnosis not present

## 2018-01-24 DIAGNOSIS — F0280 Dementia in other diseases classified elsewhere without behavioral disturbance: Secondary | ICD-10-CM | POA: Diagnosis not present

## 2018-01-24 DIAGNOSIS — N390 Urinary tract infection, site not specified: Secondary | ICD-10-CM | POA: Diagnosis not present

## 2018-01-24 DIAGNOSIS — E1122 Type 2 diabetes mellitus with diabetic chronic kidney disease: Secondary | ICD-10-CM | POA: Diagnosis not present

## 2018-01-24 DIAGNOSIS — N183 Chronic kidney disease, stage 3 (moderate): Secondary | ICD-10-CM | POA: Diagnosis not present

## 2018-01-24 DIAGNOSIS — I129 Hypertensive chronic kidney disease with stage 1 through stage 4 chronic kidney disease, or unspecified chronic kidney disease: Secondary | ICD-10-CM | POA: Diagnosis not present

## 2018-01-24 DIAGNOSIS — G309 Alzheimer's disease, unspecified: Secondary | ICD-10-CM | POA: Diagnosis not present

## 2018-01-26 DIAGNOSIS — S22000G Wedge compression fracture of unspecified thoracic vertebra, subsequent encounter for fracture with delayed healing: Secondary | ICD-10-CM | POA: Diagnosis not present

## 2018-01-26 DIAGNOSIS — Z6831 Body mass index (BMI) 31.0-31.9, adult: Secondary | ICD-10-CM | POA: Diagnosis not present

## 2018-01-26 DIAGNOSIS — S22080D Wedge compression fracture of T11-T12 vertebra, subsequent encounter for fracture with routine healing: Secondary | ICD-10-CM | POA: Diagnosis not present

## 2018-01-27 DIAGNOSIS — G309 Alzheimer's disease, unspecified: Secondary | ICD-10-CM | POA: Diagnosis not present

## 2018-01-27 DIAGNOSIS — F0280 Dementia in other diseases classified elsewhere without behavioral disturbance: Secondary | ICD-10-CM | POA: Diagnosis not present

## 2018-01-27 DIAGNOSIS — E1122 Type 2 diabetes mellitus with diabetic chronic kidney disease: Secondary | ICD-10-CM | POA: Diagnosis not present

## 2018-01-27 DIAGNOSIS — N183 Chronic kidney disease, stage 3 (moderate): Secondary | ICD-10-CM | POA: Diagnosis not present

## 2018-01-27 DIAGNOSIS — N390 Urinary tract infection, site not specified: Secondary | ICD-10-CM | POA: Diagnosis not present

## 2018-01-27 DIAGNOSIS — I129 Hypertensive chronic kidney disease with stage 1 through stage 4 chronic kidney disease, or unspecified chronic kidney disease: Secondary | ICD-10-CM | POA: Diagnosis not present

## 2018-01-28 DIAGNOSIS — N390 Urinary tract infection, site not specified: Secondary | ICD-10-CM | POA: Diagnosis not present

## 2018-01-28 DIAGNOSIS — G309 Alzheimer's disease, unspecified: Secondary | ICD-10-CM | POA: Diagnosis not present

## 2018-01-28 DIAGNOSIS — R4182 Altered mental status, unspecified: Secondary | ICD-10-CM | POA: Diagnosis not present

## 2018-01-28 DIAGNOSIS — I129 Hypertensive chronic kidney disease with stage 1 through stage 4 chronic kidney disease, or unspecified chronic kidney disease: Secondary | ICD-10-CM | POA: Diagnosis not present

## 2018-01-28 DIAGNOSIS — R54 Age-related physical debility: Secondary | ICD-10-CM | POA: Diagnosis not present

## 2018-01-28 DIAGNOSIS — Z87440 Personal history of urinary (tract) infections: Secondary | ICD-10-CM | POA: Diagnosis not present

## 2018-01-28 DIAGNOSIS — E1122 Type 2 diabetes mellitus with diabetic chronic kidney disease: Secondary | ICD-10-CM | POA: Diagnosis not present

## 2018-01-28 DIAGNOSIS — N183 Chronic kidney disease, stage 3 (moderate): Secondary | ICD-10-CM | POA: Diagnosis not present

## 2018-01-28 DIAGNOSIS — F0280 Dementia in other diseases classified elsewhere without behavioral disturbance: Secondary | ICD-10-CM | POA: Diagnosis not present

## 2018-02-01 DIAGNOSIS — N3090 Cystitis, unspecified without hematuria: Secondary | ICD-10-CM | POA: Diagnosis not present

## 2018-02-03 DIAGNOSIS — N183 Chronic kidney disease, stage 3 (moderate): Secondary | ICD-10-CM | POA: Diagnosis not present

## 2018-02-03 DIAGNOSIS — E1122 Type 2 diabetes mellitus with diabetic chronic kidney disease: Secondary | ICD-10-CM | POA: Diagnosis not present

## 2018-02-03 DIAGNOSIS — F0280 Dementia in other diseases classified elsewhere without behavioral disturbance: Secondary | ICD-10-CM | POA: Diagnosis not present

## 2018-02-03 DIAGNOSIS — N390 Urinary tract infection, site not specified: Secondary | ICD-10-CM | POA: Diagnosis not present

## 2018-02-03 DIAGNOSIS — I129 Hypertensive chronic kidney disease with stage 1 through stage 4 chronic kidney disease, or unspecified chronic kidney disease: Secondary | ICD-10-CM | POA: Diagnosis not present

## 2018-02-03 DIAGNOSIS — G309 Alzheimer's disease, unspecified: Secondary | ICD-10-CM | POA: Diagnosis not present

## 2018-02-08 DIAGNOSIS — N390 Urinary tract infection, site not specified: Secondary | ICD-10-CM | POA: Diagnosis not present

## 2018-02-08 DIAGNOSIS — E1122 Type 2 diabetes mellitus with diabetic chronic kidney disease: Secondary | ICD-10-CM | POA: Diagnosis not present

## 2018-02-08 DIAGNOSIS — N183 Chronic kidney disease, stage 3 (moderate): Secondary | ICD-10-CM | POA: Diagnosis not present

## 2018-02-08 DIAGNOSIS — I129 Hypertensive chronic kidney disease with stage 1 through stage 4 chronic kidney disease, or unspecified chronic kidney disease: Secondary | ICD-10-CM | POA: Diagnosis not present

## 2018-02-08 DIAGNOSIS — G309 Alzheimer's disease, unspecified: Secondary | ICD-10-CM | POA: Diagnosis not present

## 2018-02-08 DIAGNOSIS — F0280 Dementia in other diseases classified elsewhere without behavioral disturbance: Secondary | ICD-10-CM | POA: Diagnosis not present

## 2018-02-09 DIAGNOSIS — N183 Chronic kidney disease, stage 3 (moderate): Secondary | ICD-10-CM | POA: Diagnosis not present

## 2018-02-09 DIAGNOSIS — I129 Hypertensive chronic kidney disease with stage 1 through stage 4 chronic kidney disease, or unspecified chronic kidney disease: Secondary | ICD-10-CM | POA: Diagnosis not present

## 2018-02-09 DIAGNOSIS — N390 Urinary tract infection, site not specified: Secondary | ICD-10-CM | POA: Diagnosis not present

## 2018-02-09 DIAGNOSIS — G309 Alzheimer's disease, unspecified: Secondary | ICD-10-CM | POA: Diagnosis not present

## 2018-02-09 DIAGNOSIS — E1122 Type 2 diabetes mellitus with diabetic chronic kidney disease: Secondary | ICD-10-CM | POA: Diagnosis not present

## 2018-02-09 DIAGNOSIS — F0280 Dementia in other diseases classified elsewhere without behavioral disturbance: Secondary | ICD-10-CM | POA: Diagnosis not present

## 2018-02-11 DIAGNOSIS — G309 Alzheimer's disease, unspecified: Secondary | ICD-10-CM | POA: Diagnosis not present

## 2018-02-11 DIAGNOSIS — E785 Hyperlipidemia, unspecified: Secondary | ICD-10-CM | POA: Diagnosis not present

## 2018-02-11 DIAGNOSIS — F0280 Dementia in other diseases classified elsewhere without behavioral disturbance: Secondary | ICD-10-CM | POA: Diagnosis not present

## 2018-02-11 DIAGNOSIS — E119 Type 2 diabetes mellitus without complications: Secondary | ICD-10-CM | POA: Diagnosis not present

## 2018-02-11 DIAGNOSIS — R296 Repeated falls: Secondary | ICD-10-CM | POA: Diagnosis not present

## 2018-02-11 DIAGNOSIS — F0390 Unspecified dementia without behavioral disturbance: Secondary | ICD-10-CM | POA: Diagnosis not present

## 2018-02-11 DIAGNOSIS — I10 Essential (primary) hypertension: Secondary | ICD-10-CM | POA: Diagnosis not present

## 2018-02-11 DIAGNOSIS — I129 Hypertensive chronic kidney disease with stage 1 through stage 4 chronic kidney disease, or unspecified chronic kidney disease: Secondary | ICD-10-CM | POA: Diagnosis not present

## 2018-02-11 DIAGNOSIS — F411 Generalized anxiety disorder: Secondary | ICD-10-CM | POA: Diagnosis not present

## 2018-02-11 DIAGNOSIS — N390 Urinary tract infection, site not specified: Secondary | ICD-10-CM | POA: Diagnosis not present

## 2018-02-11 DIAGNOSIS — E538 Deficiency of other specified B group vitamins: Secondary | ICD-10-CM | POA: Diagnosis not present

## 2018-02-11 DIAGNOSIS — N183 Chronic kidney disease, stage 3 (moderate): Secondary | ICD-10-CM | POA: Diagnosis not present

## 2018-02-11 DIAGNOSIS — E1122 Type 2 diabetes mellitus with diabetic chronic kidney disease: Secondary | ICD-10-CM | POA: Diagnosis not present

## 2018-02-11 DIAGNOSIS — M810 Age-related osteoporosis without current pathological fracture: Secondary | ICD-10-CM | POA: Diagnosis not present

## 2018-02-11 DIAGNOSIS — D510 Vitamin B12 deficiency anemia due to intrinsic factor deficiency: Secondary | ICD-10-CM | POA: Diagnosis not present

## 2018-02-15 DIAGNOSIS — I129 Hypertensive chronic kidney disease with stage 1 through stage 4 chronic kidney disease, or unspecified chronic kidney disease: Secondary | ICD-10-CM | POA: Diagnosis not present

## 2018-02-15 DIAGNOSIS — N390 Urinary tract infection, site not specified: Secondary | ICD-10-CM | POA: Diagnosis not present

## 2018-02-15 DIAGNOSIS — N183 Chronic kidney disease, stage 3 (moderate): Secondary | ICD-10-CM | POA: Diagnosis not present

## 2018-02-15 DIAGNOSIS — G309 Alzheimer's disease, unspecified: Secondary | ICD-10-CM | POA: Diagnosis not present

## 2018-02-15 DIAGNOSIS — E1122 Type 2 diabetes mellitus with diabetic chronic kidney disease: Secondary | ICD-10-CM | POA: Diagnosis not present

## 2018-02-15 DIAGNOSIS — F0280 Dementia in other diseases classified elsewhere without behavioral disturbance: Secondary | ICD-10-CM | POA: Diagnosis not present

## 2018-02-18 DIAGNOSIS — I10 Essential (primary) hypertension: Secondary | ICD-10-CM | POA: Diagnosis not present

## 2018-02-18 DIAGNOSIS — R54 Age-related physical debility: Secondary | ICD-10-CM | POA: Diagnosis not present

## 2018-02-18 DIAGNOSIS — G309 Alzheimer's disease, unspecified: Secondary | ICD-10-CM | POA: Diagnosis not present

## 2018-02-21 DIAGNOSIS — E1122 Type 2 diabetes mellitus with diabetic chronic kidney disease: Secondary | ICD-10-CM | POA: Diagnosis not present

## 2018-02-21 DIAGNOSIS — F0280 Dementia in other diseases classified elsewhere without behavioral disturbance: Secondary | ICD-10-CM | POA: Diagnosis not present

## 2018-02-21 DIAGNOSIS — N183 Chronic kidney disease, stage 3 (moderate): Secondary | ICD-10-CM | POA: Diagnosis not present

## 2018-02-21 DIAGNOSIS — N390 Urinary tract infection, site not specified: Secondary | ICD-10-CM | POA: Diagnosis not present

## 2018-02-21 DIAGNOSIS — I129 Hypertensive chronic kidney disease with stage 1 through stage 4 chronic kidney disease, or unspecified chronic kidney disease: Secondary | ICD-10-CM | POA: Diagnosis not present

## 2018-02-21 DIAGNOSIS — G309 Alzheimer's disease, unspecified: Secondary | ICD-10-CM | POA: Diagnosis not present

## 2018-02-23 DIAGNOSIS — N390 Urinary tract infection, site not specified: Secondary | ICD-10-CM | POA: Diagnosis not present

## 2018-02-23 DIAGNOSIS — F0280 Dementia in other diseases classified elsewhere without behavioral disturbance: Secondary | ICD-10-CM | POA: Diagnosis not present

## 2018-02-23 DIAGNOSIS — G309 Alzheimer's disease, unspecified: Secondary | ICD-10-CM | POA: Diagnosis not present

## 2018-02-23 DIAGNOSIS — E1122 Type 2 diabetes mellitus with diabetic chronic kidney disease: Secondary | ICD-10-CM | POA: Diagnosis not present

## 2018-02-23 DIAGNOSIS — L609 Nail disorder, unspecified: Secondary | ICD-10-CM | POA: Diagnosis not present

## 2018-02-23 DIAGNOSIS — I129 Hypertensive chronic kidney disease with stage 1 through stage 4 chronic kidney disease, or unspecified chronic kidney disease: Secondary | ICD-10-CM | POA: Diagnosis not present

## 2018-02-23 DIAGNOSIS — N183 Chronic kidney disease, stage 3 (moderate): Secondary | ICD-10-CM | POA: Diagnosis not present

## 2018-02-24 DIAGNOSIS — F0391 Unspecified dementia with behavioral disturbance: Secondary | ICD-10-CM | POA: Diagnosis not present

## 2018-02-24 DIAGNOSIS — F39 Unspecified mood [affective] disorder: Secondary | ICD-10-CM | POA: Diagnosis not present

## 2018-02-24 DIAGNOSIS — N390 Urinary tract infection, site not specified: Secondary | ICD-10-CM | POA: Diagnosis not present

## 2018-02-24 DIAGNOSIS — I10 Essential (primary) hypertension: Secondary | ICD-10-CM | POA: Diagnosis not present

## 2018-03-02 DIAGNOSIS — N390 Urinary tract infection, site not specified: Secondary | ICD-10-CM | POA: Diagnosis not present

## 2018-03-02 DIAGNOSIS — I129 Hypertensive chronic kidney disease with stage 1 through stage 4 chronic kidney disease, or unspecified chronic kidney disease: Secondary | ICD-10-CM | POA: Diagnosis not present

## 2018-03-02 DIAGNOSIS — G309 Alzheimer's disease, unspecified: Secondary | ICD-10-CM | POA: Diagnosis not present

## 2018-03-02 DIAGNOSIS — N183 Chronic kidney disease, stage 3 (moderate): Secondary | ICD-10-CM | POA: Diagnosis not present

## 2018-03-02 DIAGNOSIS — F0280 Dementia in other diseases classified elsewhere without behavioral disturbance: Secondary | ICD-10-CM | POA: Diagnosis not present

## 2018-03-02 DIAGNOSIS — E1122 Type 2 diabetes mellitus with diabetic chronic kidney disease: Secondary | ICD-10-CM | POA: Diagnosis not present

## 2018-03-09 DIAGNOSIS — D559 Anemia due to enzyme disorder, unspecified: Secondary | ICD-10-CM | POA: Diagnosis not present

## 2018-03-09 DIAGNOSIS — D518 Other vitamin B12 deficiency anemias: Secondary | ICD-10-CM | POA: Diagnosis not present

## 2018-03-09 DIAGNOSIS — E119 Type 2 diabetes mellitus without complications: Secondary | ICD-10-CM | POA: Diagnosis not present

## 2018-03-09 DIAGNOSIS — E038 Other specified hypothyroidism: Secondary | ICD-10-CM | POA: Diagnosis not present

## 2018-03-16 DIAGNOSIS — N390 Urinary tract infection, site not specified: Secondary | ICD-10-CM | POA: Diagnosis not present

## 2018-03-20 DIAGNOSIS — E119 Type 2 diabetes mellitus without complications: Secondary | ICD-10-CM | POA: Diagnosis not present

## 2018-03-20 DIAGNOSIS — D518 Other vitamin B12 deficiency anemias: Secondary | ICD-10-CM | POA: Diagnosis not present

## 2018-03-20 DIAGNOSIS — E038 Other specified hypothyroidism: Secondary | ICD-10-CM | POA: Diagnosis not present

## 2018-03-20 DIAGNOSIS — D559 Anemia due to enzyme disorder, unspecified: Secondary | ICD-10-CM | POA: Diagnosis not present

## 2018-03-22 DIAGNOSIS — E119 Type 2 diabetes mellitus without complications: Secondary | ICD-10-CM | POA: Diagnosis not present

## 2018-03-22 DIAGNOSIS — F39 Unspecified mood [affective] disorder: Secondary | ICD-10-CM | POA: Diagnosis not present

## 2018-03-22 DIAGNOSIS — F0391 Unspecified dementia with behavioral disturbance: Secondary | ICD-10-CM | POA: Diagnosis not present

## 2018-03-22 DIAGNOSIS — N390 Urinary tract infection, site not specified: Secondary | ICD-10-CM | POA: Diagnosis not present

## 2018-03-24 DIAGNOSIS — E119 Type 2 diabetes mellitus without complications: Secondary | ICD-10-CM | POA: Diagnosis not present

## 2018-03-24 DIAGNOSIS — Z79899 Other long term (current) drug therapy: Secondary | ICD-10-CM | POA: Diagnosis not present

## 2018-03-24 DIAGNOSIS — D518 Other vitamin B12 deficiency anemias: Secondary | ICD-10-CM | POA: Diagnosis not present

## 2018-03-24 DIAGNOSIS — E559 Vitamin D deficiency, unspecified: Secondary | ICD-10-CM | POA: Diagnosis not present

## 2018-03-24 DIAGNOSIS — E7849 Other hyperlipidemia: Secondary | ICD-10-CM | POA: Diagnosis not present

## 2018-03-24 DIAGNOSIS — E038 Other specified hypothyroidism: Secondary | ICD-10-CM | POA: Diagnosis not present

## 2018-04-07 DIAGNOSIS — R011 Cardiac murmur, unspecified: Secondary | ICD-10-CM | POA: Diagnosis not present

## 2018-04-09 DIAGNOSIS — N3090 Cystitis, unspecified without hematuria: Secondary | ICD-10-CM | POA: Diagnosis not present

## 2018-04-13 DIAGNOSIS — N390 Urinary tract infection, site not specified: Secondary | ICD-10-CM | POA: Diagnosis not present

## 2018-04-13 DIAGNOSIS — R32 Unspecified urinary incontinence: Secondary | ICD-10-CM | POA: Diagnosis not present

## 2018-04-13 DIAGNOSIS — E119 Type 2 diabetes mellitus without complications: Secondary | ICD-10-CM | POA: Diagnosis not present

## 2018-04-13 DIAGNOSIS — E038 Other specified hypothyroidism: Secondary | ICD-10-CM | POA: Diagnosis not present

## 2018-04-13 DIAGNOSIS — D559 Anemia due to enzyme disorder, unspecified: Secondary | ICD-10-CM | POA: Diagnosis not present

## 2018-04-13 DIAGNOSIS — D518 Other vitamin B12 deficiency anemias: Secondary | ICD-10-CM | POA: Diagnosis not present

## 2018-04-13 DIAGNOSIS — N952 Postmenopausal atrophic vaginitis: Secondary | ICD-10-CM | POA: Diagnosis not present

## 2018-04-13 DIAGNOSIS — Z79899 Other long term (current) drug therapy: Secondary | ICD-10-CM | POA: Diagnosis not present

## 2018-04-16 DIAGNOSIS — R0989 Other specified symptoms and signs involving the circulatory and respiratory systems: Secondary | ICD-10-CM | POA: Diagnosis not present

## 2018-04-16 DIAGNOSIS — I714 Abdominal aortic aneurysm, without rupture: Secondary | ICD-10-CM | POA: Diagnosis not present

## 2018-04-19 DIAGNOSIS — I739 Peripheral vascular disease, unspecified: Secondary | ICD-10-CM | POA: Diagnosis not present

## 2018-04-19 DIAGNOSIS — R221 Localized swelling, mass and lump, neck: Secondary | ICD-10-CM | POA: Diagnosis not present

## 2018-04-24 DIAGNOSIS — S0003XA Contusion of scalp, initial encounter: Secondary | ICD-10-CM | POA: Diagnosis not present

## 2018-04-24 DIAGNOSIS — E559 Vitamin D deficiency, unspecified: Secondary | ICD-10-CM | POA: Diagnosis not present

## 2018-04-24 DIAGNOSIS — M79644 Pain in right finger(s): Secondary | ICD-10-CM | POA: Diagnosis not present

## 2018-04-24 DIAGNOSIS — R51 Headache: Secondary | ICD-10-CM | POA: Diagnosis not present

## 2018-04-24 DIAGNOSIS — I10 Essential (primary) hypertension: Secondary | ICD-10-CM | POA: Diagnosis not present

## 2018-04-24 DIAGNOSIS — M19071 Primary osteoarthritis, right ankle and foot: Secondary | ICD-10-CM | POA: Diagnosis not present

## 2018-04-24 DIAGNOSIS — R220 Localized swelling, mass and lump, head: Secondary | ICD-10-CM | POA: Diagnosis not present

## 2018-04-24 DIAGNOSIS — Z79899 Other long term (current) drug therapy: Secondary | ICD-10-CM | POA: Diagnosis not present

## 2018-04-24 DIAGNOSIS — M8580 Other specified disorders of bone density and structure, unspecified site: Secondary | ICD-10-CM | POA: Diagnosis not present

## 2018-04-24 DIAGNOSIS — J449 Chronic obstructive pulmonary disease, unspecified: Secondary | ICD-10-CM | POA: Diagnosis present

## 2018-04-24 DIAGNOSIS — I161 Hypertensive emergency: Secondary | ICD-10-CM | POA: Diagnosis present

## 2018-04-24 DIAGNOSIS — E119 Type 2 diabetes mellitus without complications: Secondary | ICD-10-CM | POA: Diagnosis not present

## 2018-04-24 DIAGNOSIS — S62201A Unspecified fracture of first metacarpal bone, right hand, initial encounter for closed fracture: Secondary | ICD-10-CM | POA: Diagnosis not present

## 2018-04-24 DIAGNOSIS — G4733 Obstructive sleep apnea (adult) (pediatric): Secondary | ICD-10-CM | POA: Diagnosis present

## 2018-04-24 DIAGNOSIS — S92414S Nondisplaced fracture of proximal phalanx of right great toe, sequela: Secondary | ICD-10-CM | POA: Diagnosis not present

## 2018-04-24 DIAGNOSIS — S065X0A Traumatic subdural hemorrhage without loss of consciousness, initial encounter: Secondary | ICD-10-CM | POA: Diagnosis not present

## 2018-04-24 DIAGNOSIS — S066X0A Traumatic subarachnoid hemorrhage without loss of consciousness, initial encounter: Secondary | ICD-10-CM | POA: Diagnosis not present

## 2018-04-24 DIAGNOSIS — R0602 Shortness of breath: Secondary | ICD-10-CM | POA: Diagnosis not present

## 2018-04-24 DIAGNOSIS — W19XXXS Unspecified fall, sequela: Secondary | ICD-10-CM | POA: Diagnosis not present

## 2018-04-24 DIAGNOSIS — F0280 Dementia in other diseases classified elsewhere without behavioral disturbance: Secondary | ICD-10-CM | POA: Diagnosis present

## 2018-04-24 DIAGNOSIS — D518 Other vitamin B12 deficiency anemias: Secondary | ICD-10-CM | POA: Diagnosis not present

## 2018-04-24 DIAGNOSIS — S62231S Other displaced fracture of base of first metacarpal bone, right hand, sequela: Secondary | ICD-10-CM | POA: Diagnosis not present

## 2018-04-24 DIAGNOSIS — E038 Other specified hypothyroidism: Secondary | ICD-10-CM | POA: Diagnosis not present

## 2018-04-24 DIAGNOSIS — M85871 Other specified disorders of bone density and structure, right ankle and foot: Secondary | ICD-10-CM | POA: Diagnosis not present

## 2018-04-24 DIAGNOSIS — G301 Alzheimer's disease with late onset: Secondary | ICD-10-CM | POA: Diagnosis present

## 2018-04-24 DIAGNOSIS — F0390 Unspecified dementia without behavioral disturbance: Secondary | ICD-10-CM | POA: Diagnosis not present

## 2018-04-24 DIAGNOSIS — I129 Hypertensive chronic kidney disease with stage 1 through stage 4 chronic kidney disease, or unspecified chronic kidney disease: Secondary | ICD-10-CM | POA: Diagnosis present

## 2018-04-24 DIAGNOSIS — R5381 Other malaise: Secondary | ICD-10-CM | POA: Diagnosis not present

## 2018-04-24 DIAGNOSIS — F0391 Unspecified dementia with behavioral disturbance: Secondary | ICD-10-CM | POA: Diagnosis not present

## 2018-04-24 DIAGNOSIS — W19XXXA Unspecified fall, initial encounter: Secondary | ICD-10-CM | POA: Diagnosis not present

## 2018-04-24 DIAGNOSIS — E1122 Type 2 diabetes mellitus with diabetic chronic kidney disease: Secondary | ICD-10-CM | POA: Diagnosis present

## 2018-04-24 DIAGNOSIS — M1811 Unilateral primary osteoarthritis of first carpometacarpal joint, right hand: Secondary | ICD-10-CM | POA: Diagnosis not present

## 2018-04-24 DIAGNOSIS — S066X0S Traumatic subarachnoid hemorrhage without loss of consciousness, sequela: Secondary | ICD-10-CM | POA: Diagnosis not present

## 2018-04-24 DIAGNOSIS — S62509A Fracture of unspecified phalanx of unspecified thumb, initial encounter for closed fracture: Secondary | ICD-10-CM | POA: Diagnosis not present

## 2018-04-24 DIAGNOSIS — S199XXA Unspecified injury of neck, initial encounter: Secondary | ICD-10-CM | POA: Diagnosis not present

## 2018-04-24 DIAGNOSIS — S92514A Nondisplaced fracture of proximal phalanx of right lesser toe(s), initial encounter for closed fracture: Secondary | ICD-10-CM | POA: Diagnosis not present

## 2018-04-24 DIAGNOSIS — R0989 Other specified symptoms and signs involving the circulatory and respiratory systems: Secondary | ICD-10-CM | POA: Diagnosis not present

## 2018-04-24 DIAGNOSIS — N183 Chronic kidney disease, stage 3 (moderate): Secondary | ICD-10-CM | POA: Diagnosis present

## 2018-04-24 DIAGNOSIS — I340 Nonrheumatic mitral (valve) insufficiency: Secondary | ICD-10-CM | POA: Diagnosis not present

## 2018-04-24 DIAGNOSIS — E785 Hyperlipidemia, unspecified: Secondary | ICD-10-CM | POA: Diagnosis present

## 2018-04-24 DIAGNOSIS — D649 Anemia, unspecified: Secondary | ICD-10-CM | POA: Diagnosis not present

## 2018-04-24 DIAGNOSIS — D6489 Other specified anemias: Secondary | ICD-10-CM | POA: Diagnosis present

## 2018-04-24 DIAGNOSIS — E1165 Type 2 diabetes mellitus with hyperglycemia: Secondary | ICD-10-CM | POA: Diagnosis not present

## 2018-04-24 DIAGNOSIS — R1310 Dysphagia, unspecified: Secondary | ICD-10-CM | POA: Diagnosis not present

## 2018-04-24 DIAGNOSIS — I519 Heart disease, unspecified: Secondary | ICD-10-CM | POA: Diagnosis not present

## 2018-04-24 DIAGNOSIS — S066X9A Traumatic subarachnoid hemorrhage with loss of consciousness of unspecified duration, initial encounter: Secondary | ICD-10-CM | POA: Diagnosis present

## 2018-04-24 DIAGNOSIS — F418 Other specified anxiety disorders: Secondary | ICD-10-CM | POA: Diagnosis present

## 2018-04-24 DIAGNOSIS — Z96653 Presence of artificial knee joint, bilateral: Secondary | ICD-10-CM | POA: Diagnosis present

## 2018-04-24 DIAGNOSIS — Z791 Long term (current) use of non-steroidal anti-inflammatories (NSAID): Secondary | ICD-10-CM | POA: Diagnosis not present

## 2018-04-24 DIAGNOSIS — Z8673 Personal history of transient ischemic attack (TIA), and cerebral infarction without residual deficits: Secondary | ICD-10-CM | POA: Diagnosis not present

## 2018-04-24 DIAGNOSIS — S0990XA Unspecified injury of head, initial encounter: Secondary | ICD-10-CM | POA: Diagnosis not present

## 2018-04-24 DIAGNOSIS — S99922A Unspecified injury of left foot, initial encounter: Secondary | ICD-10-CM | POA: Diagnosis not present

## 2018-04-24 DIAGNOSIS — E039 Hypothyroidism, unspecified: Secondary | ICD-10-CM | POA: Diagnosis present

## 2018-04-24 DIAGNOSIS — K449 Diaphragmatic hernia without obstruction or gangrene: Secondary | ICD-10-CM | POA: Diagnosis present

## 2018-04-24 DIAGNOSIS — G894 Chronic pain syndrome: Secondary | ICD-10-CM | POA: Diagnosis present

## 2018-04-24 DIAGNOSIS — S92414A Nondisplaced fracture of proximal phalanx of right great toe, initial encounter for closed fracture: Secondary | ICD-10-CM | POA: Diagnosis present

## 2018-04-24 DIAGNOSIS — S62231A Other displaced fracture of base of first metacarpal bone, right hand, initial encounter for closed fracture: Secondary | ICD-10-CM | POA: Diagnosis present

## 2018-04-24 DIAGNOSIS — E7849 Other hyperlipidemia: Secondary | ICD-10-CM | POA: Diagnosis not present

## 2018-04-24 DIAGNOSIS — I071 Rheumatic tricuspid insufficiency: Secondary | ICD-10-CM | POA: Diagnosis not present

## 2018-04-24 DIAGNOSIS — S06300A Unspecified focal traumatic brain injury without loss of consciousness, initial encounter: Secondary | ICD-10-CM | POA: Diagnosis not present

## 2018-04-28 DIAGNOSIS — Z79899 Other long term (current) drug therapy: Secondary | ICD-10-CM | POA: Diagnosis not present

## 2018-04-28 DIAGNOSIS — E7849 Other hyperlipidemia: Secondary | ICD-10-CM | POA: Diagnosis not present

## 2018-04-28 DIAGNOSIS — D518 Other vitamin B12 deficiency anemias: Secondary | ICD-10-CM | POA: Diagnosis not present

## 2018-04-28 DIAGNOSIS — E038 Other specified hypothyroidism: Secondary | ICD-10-CM | POA: Diagnosis not present

## 2018-04-28 DIAGNOSIS — E559 Vitamin D deficiency, unspecified: Secondary | ICD-10-CM | POA: Diagnosis not present

## 2018-04-28 DIAGNOSIS — E119 Type 2 diabetes mellitus without complications: Secondary | ICD-10-CM | POA: Diagnosis not present

## 2018-04-30 DIAGNOSIS — G309 Alzheimer's disease, unspecified: Secondary | ICD-10-CM | POA: Diagnosis not present

## 2018-04-30 DIAGNOSIS — F0280 Dementia in other diseases classified elsewhere without behavioral disturbance: Secondary | ICD-10-CM | POA: Diagnosis not present

## 2018-04-30 DIAGNOSIS — S0003XA Contusion of scalp, initial encounter: Secondary | ICD-10-CM | POA: Diagnosis not present

## 2018-05-01 DIAGNOSIS — D649 Anemia, unspecified: Secondary | ICD-10-CM | POA: Diagnosis not present

## 2018-05-01 DIAGNOSIS — N183 Chronic kidney disease, stage 3 (moderate): Secondary | ICD-10-CM | POA: Diagnosis not present

## 2018-05-01 DIAGNOSIS — J449 Chronic obstructive pulmonary disease, unspecified: Secondary | ICD-10-CM | POA: Diagnosis not present

## 2018-05-01 DIAGNOSIS — I129 Hypertensive chronic kidney disease with stage 1 through stage 4 chronic kidney disease, or unspecified chronic kidney disease: Secondary | ICD-10-CM | POA: Diagnosis not present

## 2018-05-01 DIAGNOSIS — G8929 Other chronic pain: Secondary | ICD-10-CM | POA: Diagnosis not present

## 2018-05-01 DIAGNOSIS — M1991 Primary osteoarthritis, unspecified site: Secondary | ICD-10-CM | POA: Diagnosis not present

## 2018-05-01 DIAGNOSIS — Z7982 Long term (current) use of aspirin: Secondary | ICD-10-CM | POA: Diagnosis not present

## 2018-05-01 DIAGNOSIS — F0280 Dementia in other diseases classified elsewhere without behavioral disturbance: Secondary | ICD-10-CM | POA: Diagnosis not present

## 2018-05-01 DIAGNOSIS — S066X9D Traumatic subarachnoid hemorrhage with loss of consciousness of unspecified duration, subsequent encounter: Secondary | ICD-10-CM | POA: Diagnosis not present

## 2018-05-01 DIAGNOSIS — Z9181 History of falling: Secondary | ICD-10-CM | POA: Diagnosis not present

## 2018-05-01 DIAGNOSIS — S92401D Displaced unspecified fracture of right great toe, subsequent encounter for fracture with routine healing: Secondary | ICD-10-CM | POA: Diagnosis not present

## 2018-05-01 DIAGNOSIS — F329 Major depressive disorder, single episode, unspecified: Secondary | ICD-10-CM | POA: Diagnosis not present

## 2018-05-01 DIAGNOSIS — M545 Low back pain: Secondary | ICD-10-CM | POA: Diagnosis not present

## 2018-05-01 DIAGNOSIS — F419 Anxiety disorder, unspecified: Secondary | ICD-10-CM | POA: Diagnosis not present

## 2018-05-01 DIAGNOSIS — S62231D Other displaced fracture of base of first metacarpal bone, right hand, subsequent encounter for fracture with routine healing: Secondary | ICD-10-CM | POA: Diagnosis not present

## 2018-05-01 DIAGNOSIS — E1122 Type 2 diabetes mellitus with diabetic chronic kidney disease: Secondary | ICD-10-CM | POA: Diagnosis not present

## 2018-05-01 DIAGNOSIS — G301 Alzheimer's disease with late onset: Secondary | ICD-10-CM | POA: Diagnosis not present

## 2018-05-01 DIAGNOSIS — M810 Age-related osteoporosis without current pathological fracture: Secondary | ICD-10-CM | POA: Diagnosis not present

## 2018-05-02 DIAGNOSIS — I129 Hypertensive chronic kidney disease with stage 1 through stage 4 chronic kidney disease, or unspecified chronic kidney disease: Secondary | ICD-10-CM | POA: Diagnosis not present

## 2018-05-02 DIAGNOSIS — N183 Chronic kidney disease, stage 3 (moderate): Secondary | ICD-10-CM | POA: Diagnosis not present

## 2018-05-02 DIAGNOSIS — S92401D Displaced unspecified fracture of right great toe, subsequent encounter for fracture with routine healing: Secondary | ICD-10-CM | POA: Diagnosis not present

## 2018-05-02 DIAGNOSIS — S066X9D Traumatic subarachnoid hemorrhage with loss of consciousness of unspecified duration, subsequent encounter: Secondary | ICD-10-CM | POA: Diagnosis not present

## 2018-05-02 DIAGNOSIS — S62231D Other displaced fracture of base of first metacarpal bone, right hand, subsequent encounter for fracture with routine healing: Secondary | ICD-10-CM | POA: Diagnosis not present

## 2018-05-02 DIAGNOSIS — E1122 Type 2 diabetes mellitus with diabetic chronic kidney disease: Secondary | ICD-10-CM | POA: Diagnosis not present

## 2018-05-04 DIAGNOSIS — S62231D Other displaced fracture of base of first metacarpal bone, right hand, subsequent encounter for fracture with routine healing: Secondary | ICD-10-CM | POA: Diagnosis not present

## 2018-05-04 DIAGNOSIS — S066X9D Traumatic subarachnoid hemorrhage with loss of consciousness of unspecified duration, subsequent encounter: Secondary | ICD-10-CM | POA: Diagnosis not present

## 2018-05-04 DIAGNOSIS — N183 Chronic kidney disease, stage 3 (moderate): Secondary | ICD-10-CM | POA: Diagnosis not present

## 2018-05-04 DIAGNOSIS — I129 Hypertensive chronic kidney disease with stage 1 through stage 4 chronic kidney disease, or unspecified chronic kidney disease: Secondary | ICD-10-CM | POA: Diagnosis not present

## 2018-05-04 DIAGNOSIS — S92401D Displaced unspecified fracture of right great toe, subsequent encounter for fracture with routine healing: Secondary | ICD-10-CM | POA: Diagnosis not present

## 2018-05-04 DIAGNOSIS — E1122 Type 2 diabetes mellitus with diabetic chronic kidney disease: Secondary | ICD-10-CM | POA: Diagnosis not present

## 2018-05-05 DIAGNOSIS — N183 Chronic kidney disease, stage 3 (moderate): Secondary | ICD-10-CM | POA: Diagnosis not present

## 2018-05-05 DIAGNOSIS — F0391 Unspecified dementia with behavioral disturbance: Secondary | ICD-10-CM | POA: Diagnosis not present

## 2018-05-05 DIAGNOSIS — D518 Other vitamin B12 deficiency anemias: Secondary | ICD-10-CM | POA: Diagnosis not present

## 2018-05-05 DIAGNOSIS — S62234D Other nondisplaced fracture of base of first metacarpal bone, right hand, subsequent encounter for fracture with routine healing: Secondary | ICD-10-CM | POA: Diagnosis not present

## 2018-05-05 DIAGNOSIS — S62231D Other displaced fracture of base of first metacarpal bone, right hand, subsequent encounter for fracture with routine healing: Secondary | ICD-10-CM | POA: Diagnosis not present

## 2018-05-05 DIAGNOSIS — E7849 Other hyperlipidemia: Secondary | ICD-10-CM | POA: Diagnosis not present

## 2018-05-05 DIAGNOSIS — I129 Hypertensive chronic kidney disease with stage 1 through stage 4 chronic kidney disease, or unspecified chronic kidney disease: Secondary | ICD-10-CM | POA: Diagnosis not present

## 2018-05-05 DIAGNOSIS — Z79899 Other long term (current) drug therapy: Secondary | ICD-10-CM | POA: Diagnosis not present

## 2018-05-05 DIAGNOSIS — S066X9D Traumatic subarachnoid hemorrhage with loss of consciousness of unspecified duration, subsequent encounter: Secondary | ICD-10-CM | POA: Diagnosis not present

## 2018-05-05 DIAGNOSIS — E559 Vitamin D deficiency, unspecified: Secondary | ICD-10-CM | POA: Diagnosis not present

## 2018-05-05 DIAGNOSIS — E038 Other specified hypothyroidism: Secondary | ICD-10-CM | POA: Diagnosis not present

## 2018-05-05 DIAGNOSIS — E119 Type 2 diabetes mellitus without complications: Secondary | ICD-10-CM | POA: Diagnosis not present

## 2018-05-05 DIAGNOSIS — E1122 Type 2 diabetes mellitus with diabetic chronic kidney disease: Secondary | ICD-10-CM | POA: Diagnosis not present

## 2018-05-05 DIAGNOSIS — S92401D Displaced unspecified fracture of right great toe, subsequent encounter for fracture with routine healing: Secondary | ICD-10-CM | POA: Diagnosis not present

## 2018-05-06 DIAGNOSIS — S066X9D Traumatic subarachnoid hemorrhage with loss of consciousness of unspecified duration, subsequent encounter: Secondary | ICD-10-CM | POA: Diagnosis not present

## 2018-05-06 DIAGNOSIS — S92401D Displaced unspecified fracture of right great toe, subsequent encounter for fracture with routine healing: Secondary | ICD-10-CM | POA: Diagnosis not present

## 2018-05-06 DIAGNOSIS — N183 Chronic kidney disease, stage 3 (moderate): Secondary | ICD-10-CM | POA: Diagnosis not present

## 2018-05-06 DIAGNOSIS — S62231D Other displaced fracture of base of first metacarpal bone, right hand, subsequent encounter for fracture with routine healing: Secondary | ICD-10-CM | POA: Diagnosis not present

## 2018-05-06 DIAGNOSIS — E1122 Type 2 diabetes mellitus with diabetic chronic kidney disease: Secondary | ICD-10-CM | POA: Diagnosis not present

## 2018-05-06 DIAGNOSIS — I129 Hypertensive chronic kidney disease with stage 1 through stage 4 chronic kidney disease, or unspecified chronic kidney disease: Secondary | ICD-10-CM | POA: Diagnosis not present

## 2018-05-07 DIAGNOSIS — S92401D Displaced unspecified fracture of right great toe, subsequent encounter for fracture with routine healing: Secondary | ICD-10-CM | POA: Diagnosis not present

## 2018-05-07 DIAGNOSIS — Z7982 Long term (current) use of aspirin: Secondary | ICD-10-CM | POA: Diagnosis not present

## 2018-05-07 DIAGNOSIS — S62231D Other displaced fracture of base of first metacarpal bone, right hand, subsequent encounter for fracture with routine healing: Secondary | ICD-10-CM | POA: Diagnosis not present

## 2018-05-07 DIAGNOSIS — I10 Essential (primary) hypertension: Secondary | ICD-10-CM | POA: Diagnosis not present

## 2018-05-07 DIAGNOSIS — I129 Hypertensive chronic kidney disease with stage 1 through stage 4 chronic kidney disease, or unspecified chronic kidney disease: Secondary | ICD-10-CM | POA: Diagnosis not present

## 2018-05-07 DIAGNOSIS — S0181XA Laceration without foreign body of other part of head, initial encounter: Secondary | ICD-10-CM | POA: Diagnosis not present

## 2018-05-07 DIAGNOSIS — G960 Cerebrospinal fluid leak: Secondary | ICD-10-CM | POA: Diagnosis not present

## 2018-05-07 DIAGNOSIS — N183 Chronic kidney disease, stage 3 (moderate): Secondary | ICD-10-CM | POA: Diagnosis not present

## 2018-05-07 DIAGNOSIS — S065X0A Traumatic subdural hemorrhage without loss of consciousness, initial encounter: Secondary | ICD-10-CM | POA: Diagnosis not present

## 2018-05-07 DIAGNOSIS — F0390 Unspecified dementia without behavioral disturbance: Secondary | ICD-10-CM | POA: Diagnosis not present

## 2018-05-07 DIAGNOSIS — E1122 Type 2 diabetes mellitus with diabetic chronic kidney disease: Secondary | ICD-10-CM | POA: Diagnosis not present

## 2018-05-07 DIAGNOSIS — E119 Type 2 diabetes mellitus without complications: Secondary | ICD-10-CM | POA: Diagnosis not present

## 2018-05-07 DIAGNOSIS — Z8673 Personal history of transient ischemic attack (TIA), and cerebral infarction without residual deficits: Secondary | ICD-10-CM | POA: Diagnosis not present

## 2018-05-07 DIAGNOSIS — W19XXXA Unspecified fall, initial encounter: Secondary | ICD-10-CM | POA: Diagnosis not present

## 2018-05-07 DIAGNOSIS — S199XXA Unspecified injury of neck, initial encounter: Secondary | ICD-10-CM | POA: Diagnosis not present

## 2018-05-07 DIAGNOSIS — R0902 Hypoxemia: Secondary | ICD-10-CM | POA: Diagnosis not present

## 2018-05-07 DIAGNOSIS — S066X9D Traumatic subarachnoid hemorrhage with loss of consciousness of unspecified duration, subsequent encounter: Secondary | ICD-10-CM | POA: Diagnosis not present

## 2018-05-08 DIAGNOSIS — Z23 Encounter for immunization: Secondary | ICD-10-CM | POA: Diagnosis not present

## 2018-05-11 DIAGNOSIS — S92401D Displaced unspecified fracture of right great toe, subsequent encounter for fracture with routine healing: Secondary | ICD-10-CM | POA: Diagnosis not present

## 2018-05-11 DIAGNOSIS — N183 Chronic kidney disease, stage 3 (moderate): Secondary | ICD-10-CM | POA: Diagnosis not present

## 2018-05-11 DIAGNOSIS — I129 Hypertensive chronic kidney disease with stage 1 through stage 4 chronic kidney disease, or unspecified chronic kidney disease: Secondary | ICD-10-CM | POA: Diagnosis not present

## 2018-05-11 DIAGNOSIS — S62231D Other displaced fracture of base of first metacarpal bone, right hand, subsequent encounter for fracture with routine healing: Secondary | ICD-10-CM | POA: Diagnosis not present

## 2018-05-11 DIAGNOSIS — S066X9D Traumatic subarachnoid hemorrhage with loss of consciousness of unspecified duration, subsequent encounter: Secondary | ICD-10-CM | POA: Diagnosis not present

## 2018-05-11 DIAGNOSIS — E1122 Type 2 diabetes mellitus with diabetic chronic kidney disease: Secondary | ICD-10-CM | POA: Diagnosis not present

## 2018-05-13 DIAGNOSIS — Z9181 History of falling: Secondary | ICD-10-CM | POA: Diagnosis not present

## 2018-05-13 DIAGNOSIS — M19031 Primary osteoarthritis, right wrist: Secondary | ICD-10-CM | POA: Diagnosis not present

## 2018-05-13 DIAGNOSIS — S6991XA Unspecified injury of right wrist, hand and finger(s), initial encounter: Secondary | ICD-10-CM | POA: Diagnosis not present

## 2018-05-13 DIAGNOSIS — S62511A Displaced fracture of proximal phalanx of right thumb, initial encounter for closed fracture: Secondary | ICD-10-CM | POA: Diagnosis not present

## 2018-05-17 DIAGNOSIS — D559 Anemia due to enzyme disorder, unspecified: Secondary | ICD-10-CM | POA: Diagnosis not present

## 2018-05-17 DIAGNOSIS — E038 Other specified hypothyroidism: Secondary | ICD-10-CM | POA: Diagnosis not present

## 2018-05-17 DIAGNOSIS — E119 Type 2 diabetes mellitus without complications: Secondary | ICD-10-CM | POA: Diagnosis not present

## 2018-05-17 DIAGNOSIS — D518 Other vitamin B12 deficiency anemias: Secondary | ICD-10-CM | POA: Diagnosis not present

## 2018-05-18 DIAGNOSIS — S62231D Other displaced fracture of base of first metacarpal bone, right hand, subsequent encounter for fracture with routine healing: Secondary | ICD-10-CM | POA: Diagnosis not present

## 2018-05-18 DIAGNOSIS — I272 Pulmonary hypertension, unspecified: Secondary | ICD-10-CM | POA: Diagnosis not present

## 2018-05-18 DIAGNOSIS — I10 Essential (primary) hypertension: Secondary | ICD-10-CM | POA: Diagnosis not present

## 2018-05-18 DIAGNOSIS — E1122 Type 2 diabetes mellitus with diabetic chronic kidney disease: Secondary | ICD-10-CM | POA: Diagnosis not present

## 2018-05-18 DIAGNOSIS — Z7982 Long term (current) use of aspirin: Secondary | ICD-10-CM | POA: Diagnosis not present

## 2018-05-18 DIAGNOSIS — R079 Chest pain, unspecified: Secondary | ICD-10-CM | POA: Diagnosis not present

## 2018-05-18 DIAGNOSIS — S066X9D Traumatic subarachnoid hemorrhage with loss of consciousness of unspecified duration, subsequent encounter: Secondary | ICD-10-CM | POA: Diagnosis not present

## 2018-05-18 DIAGNOSIS — S92401D Displaced unspecified fracture of right great toe, subsequent encounter for fracture with routine healing: Secondary | ICD-10-CM | POA: Diagnosis not present

## 2018-05-18 DIAGNOSIS — N183 Chronic kidney disease, stage 3 (moderate): Secondary | ICD-10-CM | POA: Diagnosis not present

## 2018-05-18 DIAGNOSIS — F0390 Unspecified dementia without behavioral disturbance: Secondary | ICD-10-CM | POA: Diagnosis not present

## 2018-05-18 DIAGNOSIS — E782 Mixed hyperlipidemia: Secondary | ICD-10-CM | POA: Diagnosis not present

## 2018-05-18 DIAGNOSIS — I129 Hypertensive chronic kidney disease with stage 1 through stage 4 chronic kidney disease, or unspecified chronic kidney disease: Secondary | ICD-10-CM | POA: Diagnosis not present

## 2018-05-18 DIAGNOSIS — W19XXXS Unspecified fall, sequela: Secondary | ICD-10-CM | POA: Diagnosis not present

## 2018-05-19 DIAGNOSIS — S62234D Other nondisplaced fracture of base of first metacarpal bone, right hand, subsequent encounter for fracture with routine healing: Secondary | ICD-10-CM | POA: Diagnosis not present

## 2018-05-19 DIAGNOSIS — F39 Unspecified mood [affective] disorder: Secondary | ICD-10-CM | POA: Diagnosis not present

## 2018-05-19 DIAGNOSIS — F0391 Unspecified dementia with behavioral disturbance: Secondary | ICD-10-CM | POA: Diagnosis not present

## 2018-05-19 DIAGNOSIS — S066X9D Traumatic subarachnoid hemorrhage with loss of consciousness of unspecified duration, subsequent encounter: Secondary | ICD-10-CM | POA: Diagnosis not present

## 2018-05-20 DIAGNOSIS — I129 Hypertensive chronic kidney disease with stage 1 through stage 4 chronic kidney disease, or unspecified chronic kidney disease: Secondary | ICD-10-CM | POA: Diagnosis not present

## 2018-05-20 DIAGNOSIS — S066X9D Traumatic subarachnoid hemorrhage with loss of consciousness of unspecified duration, subsequent encounter: Secondary | ICD-10-CM | POA: Diagnosis not present

## 2018-05-20 DIAGNOSIS — E1122 Type 2 diabetes mellitus with diabetic chronic kidney disease: Secondary | ICD-10-CM | POA: Diagnosis not present

## 2018-05-20 DIAGNOSIS — S62231D Other displaced fracture of base of first metacarpal bone, right hand, subsequent encounter for fracture with routine healing: Secondary | ICD-10-CM | POA: Diagnosis not present

## 2018-05-20 DIAGNOSIS — Z79899 Other long term (current) drug therapy: Secondary | ICD-10-CM | POA: Diagnosis not present

## 2018-05-20 DIAGNOSIS — S92401D Displaced unspecified fracture of right great toe, subsequent encounter for fracture with routine healing: Secondary | ICD-10-CM | POA: Diagnosis not present

## 2018-05-20 DIAGNOSIS — E038 Other specified hypothyroidism: Secondary | ICD-10-CM | POA: Diagnosis not present

## 2018-05-20 DIAGNOSIS — N183 Chronic kidney disease, stage 3 (moderate): Secondary | ICD-10-CM | POA: Diagnosis not present

## 2018-05-21 DIAGNOSIS — N183 Chronic kidney disease, stage 3 (moderate): Secondary | ICD-10-CM | POA: Diagnosis not present

## 2018-05-21 DIAGNOSIS — E1122 Type 2 diabetes mellitus with diabetic chronic kidney disease: Secondary | ICD-10-CM | POA: Diagnosis not present

## 2018-05-21 DIAGNOSIS — I129 Hypertensive chronic kidney disease with stage 1 through stage 4 chronic kidney disease, or unspecified chronic kidney disease: Secondary | ICD-10-CM | POA: Diagnosis not present

## 2018-05-21 DIAGNOSIS — S066X9D Traumatic subarachnoid hemorrhage with loss of consciousness of unspecified duration, subsequent encounter: Secondary | ICD-10-CM | POA: Diagnosis not present

## 2018-05-21 DIAGNOSIS — S92401D Displaced unspecified fracture of right great toe, subsequent encounter for fracture with routine healing: Secondary | ICD-10-CM | POA: Diagnosis not present

## 2018-05-21 DIAGNOSIS — S62231D Other displaced fracture of base of first metacarpal bone, right hand, subsequent encounter for fracture with routine healing: Secondary | ICD-10-CM | POA: Diagnosis not present

## 2018-05-25 DIAGNOSIS — N183 Chronic kidney disease, stage 3 (moderate): Secondary | ICD-10-CM | POA: Diagnosis not present

## 2018-05-25 DIAGNOSIS — E1122 Type 2 diabetes mellitus with diabetic chronic kidney disease: Secondary | ICD-10-CM | POA: Diagnosis not present

## 2018-05-25 DIAGNOSIS — S066X9D Traumatic subarachnoid hemorrhage with loss of consciousness of unspecified duration, subsequent encounter: Secondary | ICD-10-CM | POA: Diagnosis not present

## 2018-05-25 DIAGNOSIS — S92401D Displaced unspecified fracture of right great toe, subsequent encounter for fracture with routine healing: Secondary | ICD-10-CM | POA: Diagnosis not present

## 2018-05-25 DIAGNOSIS — S62231D Other displaced fracture of base of first metacarpal bone, right hand, subsequent encounter for fracture with routine healing: Secondary | ICD-10-CM | POA: Diagnosis not present

## 2018-05-25 DIAGNOSIS — I129 Hypertensive chronic kidney disease with stage 1 through stage 4 chronic kidney disease, or unspecified chronic kidney disease: Secondary | ICD-10-CM | POA: Diagnosis not present

## 2018-05-26 DIAGNOSIS — I129 Hypertensive chronic kidney disease with stage 1 through stage 4 chronic kidney disease, or unspecified chronic kidney disease: Secondary | ICD-10-CM | POA: Diagnosis not present

## 2018-05-26 DIAGNOSIS — E1122 Type 2 diabetes mellitus with diabetic chronic kidney disease: Secondary | ICD-10-CM | POA: Diagnosis not present

## 2018-05-26 DIAGNOSIS — S066X9D Traumatic subarachnoid hemorrhage with loss of consciousness of unspecified duration, subsequent encounter: Secondary | ICD-10-CM | POA: Diagnosis not present

## 2018-05-26 DIAGNOSIS — S62231D Other displaced fracture of base of first metacarpal bone, right hand, subsequent encounter for fracture with routine healing: Secondary | ICD-10-CM | POA: Diagnosis not present

## 2018-05-26 DIAGNOSIS — S92401D Displaced unspecified fracture of right great toe, subsequent encounter for fracture with routine healing: Secondary | ICD-10-CM | POA: Diagnosis not present

## 2018-05-26 DIAGNOSIS — N183 Chronic kidney disease, stage 3 (moderate): Secondary | ICD-10-CM | POA: Diagnosis not present

## 2018-05-27 DIAGNOSIS — S92401D Displaced unspecified fracture of right great toe, subsequent encounter for fracture with routine healing: Secondary | ICD-10-CM | POA: Diagnosis not present

## 2018-05-27 DIAGNOSIS — E1149 Type 2 diabetes mellitus with other diabetic neurological complication: Secondary | ICD-10-CM | POA: Diagnosis not present

## 2018-05-27 DIAGNOSIS — M2042 Other hammer toe(s) (acquired), left foot: Secondary | ICD-10-CM | POA: Diagnosis not present

## 2018-05-27 DIAGNOSIS — I129 Hypertensive chronic kidney disease with stage 1 through stage 4 chronic kidney disease, or unspecified chronic kidney disease: Secondary | ICD-10-CM | POA: Diagnosis not present

## 2018-05-27 DIAGNOSIS — M79675 Pain in left toe(s): Secondary | ICD-10-CM | POA: Diagnosis not present

## 2018-05-27 DIAGNOSIS — B351 Tinea unguium: Secondary | ICD-10-CM | POA: Diagnosis not present

## 2018-05-27 DIAGNOSIS — S066X9D Traumatic subarachnoid hemorrhage with loss of consciousness of unspecified duration, subsequent encounter: Secondary | ICD-10-CM | POA: Diagnosis not present

## 2018-05-27 DIAGNOSIS — E1122 Type 2 diabetes mellitus with diabetic chronic kidney disease: Secondary | ICD-10-CM | POA: Diagnosis not present

## 2018-05-27 DIAGNOSIS — N183 Chronic kidney disease, stage 3 (moderate): Secondary | ICD-10-CM | POA: Diagnosis not present

## 2018-05-27 DIAGNOSIS — S62231D Other displaced fracture of base of first metacarpal bone, right hand, subsequent encounter for fracture with routine healing: Secondary | ICD-10-CM | POA: Diagnosis not present

## 2018-05-28 DIAGNOSIS — S066X9D Traumatic subarachnoid hemorrhage with loss of consciousness of unspecified duration, subsequent encounter: Secondary | ICD-10-CM | POA: Diagnosis not present

## 2018-05-28 DIAGNOSIS — S92401D Displaced unspecified fracture of right great toe, subsequent encounter for fracture with routine healing: Secondary | ICD-10-CM | POA: Diagnosis not present

## 2018-05-28 DIAGNOSIS — S62231D Other displaced fracture of base of first metacarpal bone, right hand, subsequent encounter for fracture with routine healing: Secondary | ICD-10-CM | POA: Diagnosis not present

## 2018-05-28 DIAGNOSIS — I129 Hypertensive chronic kidney disease with stage 1 through stage 4 chronic kidney disease, or unspecified chronic kidney disease: Secondary | ICD-10-CM | POA: Diagnosis not present

## 2018-05-28 DIAGNOSIS — N183 Chronic kidney disease, stage 3 (moderate): Secondary | ICD-10-CM | POA: Diagnosis not present

## 2018-05-28 DIAGNOSIS — E1122 Type 2 diabetes mellitus with diabetic chronic kidney disease: Secondary | ICD-10-CM | POA: Diagnosis not present

## 2018-06-01 DIAGNOSIS — I129 Hypertensive chronic kidney disease with stage 1 through stage 4 chronic kidney disease, or unspecified chronic kidney disease: Secondary | ICD-10-CM | POA: Diagnosis not present

## 2018-06-01 DIAGNOSIS — F39 Unspecified mood [affective] disorder: Secondary | ICD-10-CM | POA: Diagnosis not present

## 2018-06-01 DIAGNOSIS — S066X9D Traumatic subarachnoid hemorrhage with loss of consciousness of unspecified duration, subsequent encounter: Secondary | ICD-10-CM | POA: Diagnosis not present

## 2018-06-01 DIAGNOSIS — S62231D Other displaced fracture of base of first metacarpal bone, right hand, subsequent encounter for fracture with routine healing: Secondary | ICD-10-CM | POA: Diagnosis not present

## 2018-06-01 DIAGNOSIS — M810 Age-related osteoporosis without current pathological fracture: Secondary | ICD-10-CM | POA: Diagnosis not present

## 2018-06-01 DIAGNOSIS — E1122 Type 2 diabetes mellitus with diabetic chronic kidney disease: Secondary | ICD-10-CM | POA: Diagnosis not present

## 2018-06-01 DIAGNOSIS — S92401D Displaced unspecified fracture of right great toe, subsequent encounter for fracture with routine healing: Secondary | ICD-10-CM | POA: Diagnosis not present

## 2018-06-01 DIAGNOSIS — I2510 Atherosclerotic heart disease of native coronary artery without angina pectoris: Secondary | ICD-10-CM | POA: Diagnosis not present

## 2018-06-01 DIAGNOSIS — N183 Chronic kidney disease, stage 3 (moderate): Secondary | ICD-10-CM | POA: Diagnosis not present

## 2018-06-01 DIAGNOSIS — F0391 Unspecified dementia with behavioral disturbance: Secondary | ICD-10-CM | POA: Diagnosis not present

## 2018-06-03 DIAGNOSIS — S62511D Displaced fracture of proximal phalanx of right thumb, subsequent encounter for fracture with routine healing: Secondary | ICD-10-CM | POA: Diagnosis not present

## 2018-06-04 DIAGNOSIS — G960 Cerebrospinal fluid leak: Secondary | ICD-10-CM | POA: Diagnosis not present

## 2018-06-04 DIAGNOSIS — F0390 Unspecified dementia without behavioral disturbance: Secondary | ICD-10-CM | POA: Diagnosis not present

## 2018-06-04 DIAGNOSIS — R296 Repeated falls: Secondary | ICD-10-CM | POA: Diagnosis not present

## 2018-06-04 DIAGNOSIS — S069X0A Unspecified intracranial injury without loss of consciousness, initial encounter: Secondary | ICD-10-CM | POA: Diagnosis not present

## 2018-06-15 ENCOUNTER — Observation Stay (HOSPITAL_COMMUNITY)
Admission: EM | Admit: 2018-06-15 | Discharge: 2018-06-16 | Disposition: A | Discharge status: Home / Self Care | Payer: MEDICARE | Attending: Internal Medicine | Admitting: Internal Medicine

## 2018-06-15 ENCOUNTER — Emergency Department (HOSPITAL_COMMUNITY): Payer: MEDICARE

## 2018-06-15 ENCOUNTER — Encounter (HOSPITAL_COMMUNITY): Payer: Self-pay | Admitting: Emergency Medicine

## 2018-06-15 DIAGNOSIS — G309 Alzheimer's disease, unspecified: Secondary | ICD-10-CM | POA: Diagnosis not present

## 2018-06-15 DIAGNOSIS — W19XXXA Unspecified fall, initial encounter: Secondary | ICD-10-CM | POA: Insufficient documentation

## 2018-06-15 DIAGNOSIS — F0280 Dementia in other diseases classified elsewhere without behavioral disturbance: Secondary | ICD-10-CM | POA: Diagnosis present

## 2018-06-15 DIAGNOSIS — E039 Hypothyroidism, unspecified: Secondary | ICD-10-CM | POA: Diagnosis present

## 2018-06-15 DIAGNOSIS — Z7982 Long term (current) use of aspirin: Secondary | ICD-10-CM | POA: Diagnosis not present

## 2018-06-15 DIAGNOSIS — Z888 Allergy status to other drugs, medicaments and biological substances status: Secondary | ICD-10-CM | POA: Insufficient documentation

## 2018-06-15 DIAGNOSIS — S065X9A Traumatic subdural hemorrhage with loss of consciousness of unspecified duration, initial encounter: Principal | ICD-10-CM | POA: Insufficient documentation

## 2018-06-15 DIAGNOSIS — G960 Cerebrospinal fluid leak: Secondary | ICD-10-CM | POA: Diagnosis not present

## 2018-06-15 DIAGNOSIS — I10 Essential (primary) hypertension: Secondary | ICD-10-CM | POA: Diagnosis present

## 2018-06-15 DIAGNOSIS — E785 Hyperlipidemia, unspecified: Secondary | ICD-10-CM | POA: Insufficient documentation

## 2018-06-15 DIAGNOSIS — N179 Acute kidney failure, unspecified: Secondary | ICD-10-CM | POA: Diagnosis not present

## 2018-06-15 DIAGNOSIS — S065XAA Traumatic subdural hemorrhage with loss of consciousness status unknown, initial encounter: Secondary | ICD-10-CM | POA: Diagnosis present

## 2018-06-15 DIAGNOSIS — Z79890 Hormone replacement therapy (postmenopausal): Secondary | ICD-10-CM | POA: Insufficient documentation

## 2018-06-15 DIAGNOSIS — S065X0A Traumatic subdural hemorrhage without loss of consciousness, initial encounter: Secondary | ICD-10-CM | POA: Diagnosis not present

## 2018-06-15 DIAGNOSIS — E119 Type 2 diabetes mellitus without complications: Secondary | ICD-10-CM | POA: Diagnosis not present

## 2018-06-15 DIAGNOSIS — Z79899 Other long term (current) drug therapy: Secondary | ICD-10-CM | POA: Insufficient documentation

## 2018-06-15 DIAGNOSIS — N281 Cyst of kidney, acquired: Secondary | ICD-10-CM | POA: Diagnosis not present

## 2018-06-15 DIAGNOSIS — Z794 Long term (current) use of insulin: Secondary | ICD-10-CM | POA: Diagnosis not present

## 2018-06-15 DIAGNOSIS — S0990XA Unspecified injury of head, initial encounter: Secondary | ICD-10-CM | POA: Diagnosis not present

## 2018-06-15 DIAGNOSIS — F329 Major depressive disorder, single episode, unspecified: Secondary | ICD-10-CM | POA: Insufficient documentation

## 2018-06-15 DIAGNOSIS — Z9181 History of falling: Secondary | ICD-10-CM | POA: Diagnosis not present

## 2018-06-15 DIAGNOSIS — I6200 Nontraumatic subdural hemorrhage, unspecified: Secondary | ICD-10-CM | POA: Diagnosis not present

## 2018-06-15 DIAGNOSIS — S065X0S Traumatic subdural hemorrhage without loss of consciousness, sequela: Secondary | ICD-10-CM | POA: Diagnosis not present

## 2018-06-15 HISTORY — DX: Alzheimer's disease, unspecified: G30.9

## 2018-06-15 HISTORY — DX: Dementia in other diseases classified elsewhere, unspecified severity, without behavioral disturbance, psychotic disturbance, mood disturbance, and anxiety: F02.80

## 2018-06-15 LAB — DIFFERENTIAL
Abs Immature Granulocytes: 0.02 10*3/uL (ref 0.00–0.07)
Basophils Absolute: 0 10*3/uL (ref 0.0–0.1)
Basophils Relative: 1 %
EOS PCT: 3 %
Eosinophils Absolute: 0.2 10*3/uL (ref 0.0–0.5)
Immature Granulocytes: 0 %
Lymphocytes Relative: 32 %
Lymphs Abs: 2 10*3/uL (ref 0.7–4.0)
MONO ABS: 0.6 10*3/uL (ref 0.1–1.0)
Monocytes Relative: 9 %
Neutro Abs: 3.5 10*3/uL (ref 1.7–7.7)
Neutrophils Relative %: 55 %

## 2018-06-15 LAB — I-STAT CHEM 8, ED
BUN: 39 mg/dL — ABNORMAL HIGH (ref 8–23)
Calcium, Ion: 1.28 mmol/L (ref 1.15–1.40)
Chloride: 104 mmol/L (ref 98–111)
Creatinine, Ser: 1.6 mg/dL — ABNORMAL HIGH (ref 0.44–1.00)
Glucose, Bld: 131 mg/dL — ABNORMAL HIGH (ref 70–99)
HCT: 39 % (ref 36.0–46.0)
Hemoglobin: 13.3 g/dL (ref 12.0–15.0)
Potassium: 4.7 mmol/L (ref 3.5–5.1)
Sodium: 140 mmol/L (ref 135–145)
TCO2: 30 mmol/L (ref 22–32)

## 2018-06-15 LAB — CBC
HCT: 39.7 % (ref 36.0–46.0)
Hemoglobin: 12.1 g/dL (ref 12.0–15.0)
MCH: 29.6 pg (ref 26.0–34.0)
MCHC: 30.5 g/dL (ref 30.0–36.0)
MCV: 97.1 fL (ref 80.0–100.0)
Platelets: 307 10*3/uL (ref 150–400)
RBC: 4.09 MIL/uL (ref 3.87–5.11)
RDW: 13.2 % (ref 11.5–15.5)
WBC: 6.4 10*3/uL (ref 4.0–10.5)
nRBC: 0 % (ref 0.0–0.2)

## 2018-06-15 LAB — COMPREHENSIVE METABOLIC PANEL
ALT: 11 U/L (ref 0–44)
AST: 17 U/L (ref 15–41)
Albumin: 3.8 g/dL (ref 3.5–5.0)
Alkaline Phosphatase: 82 U/L (ref 38–126)
Anion gap: 9 (ref 5–15)
BUN: 35 mg/dL — ABNORMAL HIGH (ref 8–23)
CHLORIDE: 105 mmol/L (ref 98–111)
CO2: 27 mmol/L (ref 22–32)
Calcium: 10 mg/dL (ref 8.9–10.3)
Creatinine, Ser: 1.74 mg/dL — ABNORMAL HIGH (ref 0.44–1.00)
GFR calc Af Amer: 30 mL/min — ABNORMAL LOW (ref >60)
GFR calc non Af Amer: 26 mL/min — ABNORMAL LOW (ref >60)
Glucose, Bld: 134 mg/dL — ABNORMAL HIGH (ref 70–99)
Potassium: 4.7 mmol/L (ref 3.5–5.1)
Sodium: 141 mmol/L (ref 135–145)
Total Bilirubin: 0.5 mg/dL (ref 0.3–1.2)
Total Protein: 6.7 g/dL (ref 6.5–8.1)

## 2018-06-15 LAB — PROTIME-INR
INR: 0.9
Prothrombin Time: 12.1 seconds (ref 11.4–15.2)

## 2018-06-15 LAB — CBG MONITORING, ED: Glucose-Capillary: 108 mg/dL — ABNORMAL HIGH (ref 70–99)

## 2018-06-15 LAB — APTT: aPTT: 27 seconds (ref 24–36)

## 2018-06-15 LAB — I-STAT TROPONIN, ED: Troponin i, poc: 0 ng/mL (ref 0.00–0.08)

## 2018-06-15 MED ORDER — DONEPEZIL HCL 10 MG PO TABS
10.0000 mg | ORAL_TABLET | Freq: Every day | ORAL | Status: DC
  Administered 2018-06-15: 10 mg via ORAL
  Filled 2018-06-15 (×2): qty 1

## 2018-06-15 MED ORDER — CHOLESTYRAMINE 4 G PO PACK
4.0000 g | PACK | Freq: Every day | ORAL | Status: DC
  Filled 2018-06-15: qty 1

## 2018-06-15 MED ORDER — CALCIUM CARBONATE-VITAMIN D 500-200 MG-UNIT PO TABS
1.0000 | ORAL_TABLET | Freq: Two times a day (BID) | ORAL | Status: DC
  Administered 2018-06-15: 1 via ORAL
  Filled 2018-06-15 (×2): qty 1

## 2018-06-15 MED ORDER — SODIUM CHLORIDE 0.9 % IV SOLN
INTRAVENOUS | Status: AC
  Administered 2018-06-16: via INTRAVENOUS

## 2018-06-15 MED ORDER — FERROUS SULFATE 325 (65 FE) MG PO TABS
325.0000 mg | ORAL_TABLET | Freq: Every day | ORAL | Status: DC
  Administered 2018-06-16: 325 mg via ORAL
  Filled 2018-06-15: qty 1

## 2018-06-15 MED ORDER — ATORVASTATIN CALCIUM 20 MG PO TABS
20.0000 mg | ORAL_TABLET | Freq: Every day | ORAL | Status: DC
  Filled 2018-06-15: qty 1

## 2018-06-15 MED ORDER — INSULIN ASPART 100 UNIT/ML ~~LOC~~ SOLN: 0.0000 [IU] | Freq: Three times a day (TID) | SUBCUTANEOUS | Status: DC

## 2018-06-15 MED ORDER — VITAMIN C 500 MG PO TABS: 500.0000 mg | ORAL_TABLET | Freq: Every day | ORAL | Status: DC

## 2018-06-15 MED ORDER — VITAMIN B-12 100 MCG PO TABS: 100.0000 ug | ORAL_TABLET | Freq: Every day | ORAL | Status: DC

## 2018-06-15 MED ORDER — BIOTIN 1000 MCG PO TABS: 1000.0000 ug | ORAL_TABLET | ORAL | Status: DC

## 2018-06-15 MED ORDER — CALCIUM CARB-CHOLECALCIFEROL 500-400 MG-UNIT PO TABS: 1.0000 | ORAL_TABLET | Freq: Two times a day (BID) | ORAL | Status: DC

## 2018-06-15 MED ORDER — MEMANTINE HCL 5 MG PO TABS
10.0000 mg | ORAL_TABLET | Freq: Two times a day (BID) | ORAL | Status: DC
  Administered 2018-06-15: 10 mg via ORAL
  Filled 2018-06-15: qty 1
  Filled 2018-06-15: qty 2

## 2018-06-15 MED ORDER — ESCITALOPRAM OXALATE 10 MG PO TABS: 20.0000 mg | ORAL_TABLET | Freq: Every day | ORAL | Status: DC

## 2018-06-15 MED ORDER — AMLODIPINE BESYLATE 5 MG PO TABS: 5.0000 mg | ORAL_TABLET | Freq: Every day | ORAL | Status: DC

## 2018-06-15 MED ORDER — LEVOTHYROXINE SODIUM 25 MCG PO TABS
25.0000 ug | ORAL_TABLET | Freq: Every day | ORAL | Status: DC
  Filled 2018-06-15: qty 1

## 2018-06-15 NOTE — ED Provider Notes (Signed)
Medical screening examination/treatment/procedure(s) were conducted as a shared visit with non-physician practitioner(s) and myself.  I personally evaluated the patient during the encounter.  None Patient has had 2 falls in October November.  She has had abnormal CT scan.  Scan repeated today showed new bleeding and patient was instructed to come to the emergency department.  Patient is not anticoagulated.  Patient has not had complaints of pain or headache.  She has been at normal baseline mental status.  Patient is alert and in no distress.  Patient is cheerful and interactive with her daughter.  She is confused with dementia which her daughter reports is baseline.  Heart is regular.  Lungs are grossly clear.  Movements are purposeful.  Skin is warm and dry.  I agree with plan of management.   Charlesetta Shanks, MD 06/15/18 2205

## 2018-06-15 NOTE — ED Triage Notes (Signed)
Pt daughter reports that she took pt to neurology and they ordered a CT scan of her head and called to inform her that she had additional bleeding noted on her CT. Pt currently behaving normally at baseline. Daughter believes she may have fallen but pt has hx of alzheimer disease and is a poor historian, denies falling and pain. Pupils noted to be 57mm sluggish on the right side and 59mm reactive on the left.

## 2018-06-15 NOTE — ED Provider Notes (Signed)
[place] EMERGENCY DEPARTMENT Provider Note   CSN: [id number] Arrival date & time: 06/15/18  1637     History   Chief Complaint Chief Complaint  Patient presents with  . Abnormal CT    HPI Bianca Wilson is a 84 y.o. female past medical history of Alzheimer's disease who presents for evaluation of possible intracranial hemorrhage.  Patient arrives with daughter who states that she was seen by Lyda Perone (Neuro in [facility]).  Per daughter, patient sustained 2 falls at the beginning of October in November.  Her second fall there was questionable haziness on the head CT but she was cleared from [facility] and was told to follow-up with neurology.  Patient had a follow-up appointment with neurology today who ordered a head CT for follow-up regarding her fall.  [name] daughter was taking her home when she got a call from the neurologist stating that there appeared to be a new bleed and directed her to go to the emergency department immediately.  Daughter states that patient takes aspirin.  No other blood thinners.  Her last dose of aspirin was today.  [name] daughter states that nursing home has not made any mention of new falls or trauma.  Patient states she does not recall any falls or traumas but she does have a history of Alzheimer's.  She is not currently complaining of any headache or any other pain.  Daughter states that patient has been her normal mental baseline and has not had any difficulty ambulating, vomiting.  Patient denies any vision changes, chest pain, difficulty breathing, numbness/weakness of her arms or legs, abdominal pain.  The history is provided by the patient and a relative.    Past Medical History:  Diagnosis Date  . Alzheimer disease [facility])     Patient Active Problem List   Diagnosis Date Noted  . Subdural hematoma ([place]) 06/15/2018    History reviewed. No pertinent surgical history.   OB History   None      Home  Medications    Prior to Admission medications   Medication Sig Start Date End Date Taking? Authorizing Provider  alendronate (FOSAMAX) 70 MG tablet Take 70 mg by mouth every Sunday. Take with a full glass of water on an empty stomach.   Yes [provider]  amLODipine-benazepril (LOTREL) 5-40 MG capsule Take 1 capsule by mouth daily.   Yes [provider]  aspirin 81 MG chewable tablet Chew 81 mg by mouth daily.   Yes [provider]  atorvastatin (LIPITOR) 20 MG tablet Take 20 mg by mouth daily.   Yes [provider]  Biotin 1000 MCG tablet Take 1,000 mcg by mouth every morning.   Yes [provider]  Calcium Carb-Cholecalciferol (CALCIUM 500 +D) 500-400 MG-UNIT TABS Take 1 tablet by mouth 2 (two) times daily.   Yes [provider]  chlorthalidone (HYGROTON) 25 MG tablet Take 25 mg by mouth daily.   Yes [provider]  cholestyramine (QUESTRAN) 4 g packet Take 4 g by mouth daily.   Yes [provider]  CRANBERRY PO Take 1 capsule by mouth daily.   Yes [provider]  Cyanocobalamin (VITAMIN B12 PO) Take 1 tablet by mouth daily.   Yes [provider]  donepezil (ARICEPT) 10 MG tablet Take 10 mg by mouth at bedtime.   Yes [provider]  escitalopram (LEXAPRO) 20 MG tablet Take 20 mg by mouth daily.   Yes [provider]  ferrous sulfate  325 (65 FE) MG tablet Take 325 mg by mouth daily with breakfast.   Yes [provider]  Lactobacillus (ACIDOPHILUS PO) Take 1 capsule by mouth daily.   Yes [provider]  levothyroxine (SYNTHROID, LEVOTHROID) 25 MCG tablet Take 25 mcg by mouth daily before breakfast.   Yes [provider]  memantine (NAMENDA) 10 MG tablet Take 10 mg by mouth 2 (two) times daily.   Yes [provider]  metFORMIN (GLUCOPHAGE) 500 MG tablet Take 500 mg by mouth 2 (two) times daily with a meal.   Yes [provider]   trimethoprim (TRIMPEX) 100 MG tablet Take 100 mg by mouth daily.   Yes [provider]  vitamin C (ASCORBIC ACID) 500 MG tablet Take 500 mg by mouth daily.   Yes [provider]    Family History No family history on file.  Social History Social History   Tobacco Use  . Smoking status: Never Smoker  . Smokeless tobacco: Never Used  Substance Use Topics  . Alcohol use: Never    Frequency: Never  . Drug use: Never     Allergies   Diphenhydramine-acetaminophen and Other   Review of Systems Review of Systems  Unable to perform ROS: Dementia  Neurological: Negative for weakness.     Physical Exam Updated Vital Signs BP (!) 170/57   Pulse 64   Temp 99.1 F (37.3 C) (Oral)   Resp 18   SpO2 99%   Physical Exam  Constitutional: She is oriented to person, place, and time. She appears well-developed and well-nourished.  HENT:  Head: Normocephalic and atraumatic.    Mouth/Throat: Oropharynx is clear and moist and mucous membranes are normal.  No tenderness to palpation of skull. No deformities or crepitus noted. No open wounds, abrasions or lacerations.   Eyes: Conjunctivae, EOM and lids are normal. Pupils are unequal.  Left pupil appears 4 mm and reacts.  Right pupil appears slightly sluggish and is approximately 3 mm.  Neck: Full passive range of motion without pain.  Cardiovascular: Normal rate, regular rhythm, normal heart sounds and normal pulses. Exam reveals no gallop and no friction rub.  No murmur heard. Pulses:      Radial pulses are 2+ on the right side, and 2+ on the left side.       Dorsalis pedis pulses are 2+ on the right side, and 2+ on the left side.  Pulmonary/Chest: Effort normal and breath sounds normal.  Lungs clear to auscultation bilaterally.  Symmetric chest rise.  No wheezing, rales, rhonchi. No tenderness to palpation noted to anterior chest wall.   Abdominal: Soft. Normal appearance. There is no tenderness. There is no  rigidity and no guarding.  Musculoskeletal: Normal range of motion.  Neurological: She is alert and oriented to person, place, and time.  Cranial nerves III-XII intact Follows commands, Moves all extremities  5/5 strength to BUE and BLE  Sensation intact throughout all major nerve distributions Normal coordination No pronator drift. No slurred speech. No facial droop.   Skin: Skin is warm and dry. Capillary refill takes less than 2 seconds.  Psychiatric: She has a normal mood and affect. Her speech is normal.  Nursing note and vitals reviewed.    ED Treatments / Results  Labs (all labs ordered are listed, but only abnormal results are displayed) Labs Reviewed  COMPREHENSIVE METABOLIC PANEL - Abnormal; Notable for the following components:      Result Value   Glucose, Bld 134 (*)  BUN 35 (*)    Creatinine, Ser 1.74 (*)    GFR calc non Af Amer 26 (*)    GFR calc Af Amer 30 (*)    All other components within normal limits  CBG MONITORING, ED - Abnormal; Notable for the following components:   Glucose-Capillary 108 (*)    All other components within normal limits  I-STAT CHEM 8, ED - Abnormal; Notable for the following components:   BUN 39 (*)    Creatinine, Ser 1.60 (*)    Glucose, Bld 131 (*)    All other components within normal limits  PROTIME-INR  APTT  CBC  DIFFERENTIAL  I-STAT TROPONIN, ED    EKG None  Radiology Ct Head Wo Contrast  Result Date: 06/15/2018 CLINICAL DATA:  84 year old female with intracranial bleed. Alzheimer's. Subsequent encounter. EXAM: CT HEAD WITHOUT CONTRAST TECHNIQUE: Contiguous axial images were obtained from the base of the skull through the vertex without intravenous contrast. COMPARISON:  06/15/2018 2:02 p.m.  05/07/2018. FINDINGS: Brain: Broad-based bilateral subdural hematomas measuring up to 13 mm on the right and 11 mm on left. Local mass effect without midline shift. Focal blood anterior right middle cranial fossa measuring up to  11 mm. Chronic microvascular changes. Global atrophy. No intracranial mass lesion noted on this unenhanced exam. Vascular: Vascular calcifications Skull: No skull fracture. Sinuses/Orbits: Post lens replacement. No acute orbital abnormality. Partial opacification left sphenoid sinus. Other: Mastoid air cells and middle ear cavities are clear. IMPRESSION: 1. Overall no significant change since exam performed earlier today although significant change compared to 05/07/2018. 2. Broad-based bilateral subdural hematomas measuring up to 13 mm on the right and 11 mm on left. Local mass effect without midline shift. 3. Focal blood collection anterior right middle cranial fossa measuring up to 11 mm. This may simply represent clotted blood but is immediately adjacent to the right middle cerebral artery bifurcation and thrombosed aneurysm cannot be entirely excluded. 4. Chronic microvascular changes. Global atrophy. 5. Partial opacification left sphenoid sinus. Electronically Signed   By: Genia Del M.D.   On: 06/15/2018 19:48    Procedures .Critical Care Performed by: Volanda Napoleon, PA-C Authorized by: Volanda Napoleon, PA-C   Critical care provider statement:    Critical care time (minutes):  45   Critical care was necessary to treat or prevent imminent or life-threatening deterioration of the following conditions:  CNS failure or compromise   Critical care was time spent personally by me on the following activities:  Discussions with consultants, evaluation of patient's response to treatment, examination of patient, ordering and performing treatments and interventions, ordering and review of laboratory studies, ordering and review of radiographic studies, pulse oximetry, re-evaluation of patient's condition, obtaining history from patient or surrogate and review of old charts   (including critical care time)  Medications Ordered in ED Medications  amLODipine (NORVASC) tablet 5 mg (has no  administration in time range)  atorvastatin (LIPITOR) tablet 20 mg (has no administration in time range)  cholestyramine (QUESTRAN) packet 4 g (has no administration in time range)  donepezil (ARICEPT) tablet 10 mg (has no administration in time range)  escitalopram (LEXAPRO) tablet 20 mg (has no administration in time range)  memantine (NAMENDA) tablet 10 mg (has no administration in time range)  levothyroxine (SYNTHROID, LEVOTHROID) tablet 25 mcg (has no administration in time range)  vitamin B-12 (CYANOCOBALAMIN) tablet 100 mcg (has no administration in time range)  ferrous sulfate tablet 325 mg (has no administration in time range)  Biotin 1 mg (has no administration in time range)  vitamin C (ASCORBIC ACID) tablet 500 mg (has no administration in time range)  insulin aspart (novoLOG) injection 0-9 Units (has no administration in time range)  0.9 %  sodium chloride infusion (has no administration in time range)  calcium-vitamin D (OSCAL WITH D) 500-200 MG-UNIT per tablet 1 tablet (has no administration in time range)     Initial Impression / Assessment and Plan / ED Course  I have reviewed the triage vital signs and the nursing notes.  Pertinent labs & imaging results that were available during my care of the patient were reviewed by me and considered in my medical decision making (see chart for details).     84 year old female brought in by daughter after neurologist called and stated that there was no bleeding on patient's head CT.  Had follow-up neurology appointment and head CT today for evaluation of previous falls at the beginning of the month.  She was admitted at [facility] and was discharged.  Daughter does not know of any new falls but patient does have history of dementia.  No complaints for patient at this time. Patient is afebrile, non-toxic appearing, sitting comfortably on examination table. Vital signs reviewed and stable.  No neuro deficits noted on exam.  Right pupil does  appear slightly more sluggish.  Repeat CT head given no evidence in our records.  Additionally will obtain blood work.  CMP shows BUN of 35 and Cr 1.74. INR 0.90. Trop negative. CBC shows no leukocytosis, anemia.  Troponin negative.  CT head shows broad-based bilateral subdural hematomas measuring up to 13 mm in the right 11 mm on the left.  Local mass-effect without any midline shift.  There is also a focal blood collection into the anterior right middle cranial fossa measuring up to 11 mm.  Discussed patient with Dr. Vertell Limber (Neurosurgery) after review of patient's CT scans.  [name] age and history of Alzheimer's and risk of complication,, he recommends discussing with patient and her daughter, is her healthcare power of attorney discussed what her goals for quality of life are.  In her age, there are complications with surgical intervention.  Reassured that there is no evidence of midline shift.  If family wants to proceed with surgical intervention, he will be happy to consult.  If they do not want to proceed with surgical intervention, recommended medical admission for observation.  He will plan to consult.  I had an extensive conversation with both patient and her daughter.  Daughter does not wish to proceed with any surgical intervention at this time.  She agrees with plan for admission.  I discussed with daughter regarding patient's DNR status.  As of this time, patient is full code.  Daughter states she is not ready to discuss DNR with me at this time.  Discussed with hospitalist.  Will accept patient for admission.   Final Clinical Impressions(s) / ED Diagnoses   Final diagnoses:  Subdural hematoma [facility])    ED Discharge Orders    None       Desma Mcgregor 06/15/18 2255    Charlesetta Shanks, MD 06/23/18 [phone]

## 2018-06-16 ENCOUNTER — Observation Stay (HOSPITAL_COMMUNITY): Payer: MEDICARE

## 2018-06-16 DIAGNOSIS — S065X9A Traumatic subdural hemorrhage with loss of consciousness of unspecified duration, initial encounter: Secondary | ICD-10-CM | POA: Diagnosis not present

## 2018-06-16 DIAGNOSIS — N179 Acute kidney failure, unspecified: Secondary | ICD-10-CM | POA: Diagnosis not present

## 2018-06-16 DIAGNOSIS — D559 Anemia due to enzyme disorder, unspecified: Secondary | ICD-10-CM | POA: Diagnosis not present

## 2018-06-16 DIAGNOSIS — D518 Other vitamin B12 deficiency anemias: Secondary | ICD-10-CM | POA: Diagnosis not present

## 2018-06-16 DIAGNOSIS — I10 Essential (primary) hypertension: Secondary | ICD-10-CM | POA: Diagnosis present

## 2018-06-16 DIAGNOSIS — G309 Alzheimer's disease, unspecified: Secondary | ICD-10-CM

## 2018-06-16 DIAGNOSIS — Z9181 History of falling: Secondary | ICD-10-CM

## 2018-06-16 DIAGNOSIS — E038 Other specified hypothyroidism: Secondary | ICD-10-CM | POA: Diagnosis not present

## 2018-06-16 DIAGNOSIS — E119 Type 2 diabetes mellitus without complications: Secondary | ICD-10-CM

## 2018-06-16 DIAGNOSIS — E785 Hyperlipidemia, unspecified: Secondary | ICD-10-CM | POA: Insufficient documentation

## 2018-06-16 DIAGNOSIS — F329 Major depressive disorder, single episode, unspecified: Secondary | ICD-10-CM | POA: Insufficient documentation

## 2018-06-16 DIAGNOSIS — F32A Depression, unspecified: Secondary | ICD-10-CM | POA: Insufficient documentation

## 2018-06-16 DIAGNOSIS — E039 Hypothyroidism, unspecified: Secondary | ICD-10-CM | POA: Diagnosis present

## 2018-06-16 DIAGNOSIS — F0280 Dementia in other diseases classified elsewhere without behavioral disturbance: Secondary | ICD-10-CM | POA: Diagnosis present

## 2018-06-16 LAB — GLUCOSE, CAPILLARY
Glucose-Capillary: 118 mg/dL — ABNORMAL HIGH (ref 70–99)
Glucose-Capillary: 120 mg/dL — ABNORMAL HIGH (ref 70–99)

## 2018-06-16 LAB — MRSA PCR SCREENING: MRSA by PCR: NEGATIVE

## 2018-06-16 MED ORDER — AMLODIPINE BESYLATE 5 MG PO TABS
5.0000 mg | ORAL_TABLET | Freq: Every day | ORAL | Status: DC
  Administered 2018-06-16: 5 mg via ORAL
  Filled 2018-06-16: qty 1

## 2018-06-16 MED ORDER — LORAZEPAM 2 MG/ML IJ SOLN
0.5000 mg | Freq: Once | INTRAMUSCULAR | Status: AC
  Administered 2018-06-16: 0.5 mg via INTRAVENOUS
  Filled 2018-06-16: qty 1

## 2018-06-16 NOTE — Clinical Social Work Note (Signed)
Clinical Social Work Assessment  Patient Details  Name: Bianca Wilson MRN: [id number] Date of Birth: 10-10-1930  Date of referral:  06/16/18               Reason for consult:  Facility Placement, Discharge Planning                Permission sought to share information with:  Family Supports, Customer service manager Permission granted to share information::  No(pt disoriented due to hx of dementia)  Name::     Bianca Wilson  Agency::  [name]  Relationship::  daughter  Contact Information:  [phone]  Housing/Transportation Living arrangements for the past 2 months:  [place] of Information:  Adult Children Patient Interpreter Needed:  None Criminal Activity/Legal Involvement Pertinent to Current Situation/Hospitalization:  No - Comment as needed Significant Relationships:  Adult Children, Other Family Members, Community Support Lives with:  Facility Resident Do you feel safe going back to the place where you live?  Yes Need for family participation in patient care:  Yes (Comment)  Care giving concerns:  Pt has had several falls in the past year, recently hitting her head at ALF. Admitted for observation and further monitoring of SDH. From ALF- [facility] . Pt daughter provides assistance, granddaughter is resident care coordinator at [facility].    Social Worker assessment / plan: CSW met with pt daughter at bedside. Pt sleeping, per nursing and MD notes pt not fully oriented due to dx of dementia. Introduced self, role, and reason for visit. Pt daughter confirms pt from [facility]. She has been living there for a short while after moving from another assisted living. Pt daughter states pt has had recurrent falls but does not feel like pt is unsafe at ALF and would like for her to return. She understands that PT has recommended no further follow up.  Pt granddaughter is resident care coordinator at ALF, [place] was provided her name  and number: Bianca Wilson, [phone]. [employer] and she states that all she will need is discharge summary, does not require FL2 to return. Will send when ready, pt daughter will transfer pt in personal car.   Employment status:  Retired Forensic scientist:  Medicare PT Recommendations:  24 Hour Supervision, No Follow Up Information / Referral to community resources:  Other (Comment Required)(ALF)  Patient/Family's Response to care:  Pt daughter amenable to speaking with CSW, requests pt return to ALF and will transport in personal car.  Patient/Family's Understanding of and Emotional Response to Diagnosis, Current Treatment, and Prognosis: Pt daughter states understanding of diagnosis, current treatment and prognosis. It does seem per RN report that pt daughter follows what her mom requests (full code etc) despite her lacking orientation. Pt granddaughter involved in care. Both pt daughter and pt granddaughter were emotionally appropriate during assessment. Pt daughter eager for discharge today.  Emotional Assessment Appearance:  Appears stated age Attitude/Demeanor/Rapport:  Unable to Assess Affect (typically observed):  Unable to Assess Orientation:  Oriented to Self, Fluctuating Orientation (Suspected and/or reported Sundowners) Alcohol / Substance use:  Not Applicable Psych involvement (Current and /or in the community):  No (Comment)  Discharge Needs  Concerns to be addressed:  Care Coordination Readmission within the last 30 days:  No Current discharge risk:  Dependent with Mobility, Cognitively Impaired Barriers to Discharge:  No Barriers Identified   [place] 06/16/2018, 11:46 AM

## 2018-06-16 NOTE — Care Management Obs Status (Signed)
[facility] NOTIFICATION   Patient Details  Name: Bianca Wilson MRN: [id number] Date of Birth: 08/15/1930   Medicare Observation Status Notification Given:  Yes   Reviewed MOON letter with patient's daughter, [name], by phone.      Ella Bodo, RN 06/16/2018, 3:36 PM

## 2018-06-16 NOTE — Progress Notes (Signed)
Report called to [place], [place], at [facility].  Daughter, [name], is transporting to facility. Daughter wanting interpretation of renal US and follow-up CT. [place], [place]

## 2018-06-16 NOTE — H&P (Signed)
History and Physical    Lakhia Gengler MEQ:[id number] DOB: [birth year] DOA: 06/15/2018  PCP: Raelyn Number, MD Patient coming from: Nursing home  Chief Complaint: Sent here from PCPs office for evaluation of abnormal head CT  HPI: Bianca Wilson is a 84 y.o. female with medical history significant of Alzheimer's dementia been sent to the hospital by her PCPs office for evaluation of abnormal head CT.  Patient has baseline advanced dementia and is oriented to self only at baseline.  She reports feeling well and has no complaints.  Denies having any headaches.  History was provided by daughter at bedside.  Daughter states patient had a fall on October 12 and injured her head.  She was taken to [facility] at that time and was admitted to the ICU for several days.  She was then discharged to [facility] and fell again in 2 weeks and injured the left side of her forehead.  States patient went to see her neurologist today and a CT of her head was obtained.  Daughter was told by the neurologist that the CT showed bleeding in the patient's head and patient should be taken to the hospital for evaluation.  Per daughter, no recent falls reported by nursing home. Patient does take an aspirin daily.  Daughter understands that patient will be seen by neurosurgery in the morning but does not want the patient to undergo brain surgery.  She believes the patient looks comfortable and is currently at her baseline mental status.  Daughter is requesting a sitter at bedside as patient has a history of getting up from the bed and walking away during her prior hospitalizations which puts her at risk for falls.   ED Course: Blood pressure mildly elevated but remainder of vitals stable.  Afebrile and no leukocytosis.  Hemoglobin stable at 12.1, repeat 13.3.  INR 0.9.  Creatinine 1.74, no baseline in the chart.  I-STAT troponin negative.  Head CT with evidence of bilateral subdural hematomas, local mass-effect  without midline shift, significant change compared to CT done on May 07, 2018.  Also showing focal blood collection in the anterior right middle cranial fossa measuring up to 11 mm, may represent clotted blood versus thrombosed aneurysm.  ED provider spoke to Dr. Vertell Limber, neurosurgery will consult on the patient in the morning.  Review of Systems: As per HPI otherwise 10 point review of systems negative.  Past Medical History:  Diagnosis Date  . Alzheimer disease ([place])     History reviewed. No pertinent surgical history.   reports that she has never smoked. She has never used smokeless tobacco. She reports that she does not drink alcohol or use drugs.  Allergies  Allergen Reactions  . Diphenhydramine-Acetaminophen     Other reaction(s): Delusions (intolerance)  . Other Other (See Comments)    Muscles Relaxers ,Narcotics     No family history on file.  Prior to Admission medications   Medication Sig Start Date End Date Taking? Authorizing Provider  alendronate (FOSAMAX) 70 MG tablet Take 70 mg by mouth every Sunday. Take with a full glass of water on an empty stomach.   Yes [provider]  amLODipine-benazepril (LOTREL) 5-40 MG capsule Take 1 capsule by mouth daily.   Yes [provider]  aspirin 81 MG chewable tablet Chew 81 mg by mouth daily.   Yes [provider]  atorvastatin (LIPITOR) 20 MG tablet Take 20 mg by mouth daily.   Yes [provider]  Biotin 1000 MCG  tablet Take 1,000 mcg by mouth every morning.   Yes [provider]  Calcium Carb-Cholecalciferol (CALCIUM 500 +D) 500-400 MG-UNIT TABS Take 1 tablet by mouth 2 (two) times daily.   Yes [provider]  chlorthalidone (HYGROTON) 25 MG tablet Take 25 mg by mouth daily.   Yes [provider]  cholestyramine (QUESTRAN) 4 g packet Take 4 g by mouth daily.   Yes [provider]  CRANBERRY PO Take 1 capsule by mouth daily.   Yes [provider]    Cyanocobalamin (VITAMIN B12 PO) Take 1 tablet by mouth daily.   Yes [provider]  donepezil (ARICEPT) 10 MG tablet Take 10 mg by mouth at bedtime.   Yes [provider]  escitalopram (LEXAPRO) 20 MG tablet Take 20 mg by mouth daily.   Yes [provider]  ferrous sulfate 325 (65 FE) MG tablet Take 325 mg by mouth daily with breakfast.   Yes [provider]  Lactobacillus (ACIDOPHILUS PO) Take 1 capsule by mouth daily.   Yes [provider]  levothyroxine (SYNTHROID, LEVOTHROID) 25 MCG tablet Take 25 mcg by mouth daily before breakfast.   Yes [provider]  memantine (NAMENDA) 10 MG tablet Take 10 mg by mouth 2 (two) times daily.   Yes [provider]  metFORMIN (GLUCOPHAGE) 500 MG tablet Take 500 mg by mouth 2 (two) times daily with a meal.   Yes [provider]  trimethoprim (TRIMPEX) 100 MG tablet Take 100 mg by mouth daily.   Yes [provider]  vitamin C (ASCORBIC ACID) 500 MG tablet Take 500 mg by mouth daily.   Yes [provider]    Physical Exam: Vitals:   06/15/18 2130 06/15/18 2200 06/15/18 2325 06/15/18 2330  BP: 137/61 (!) 170/57 (!) 166/73   Pulse:    (!) 52  Resp: 17 18  12   Temp:   98.3 F (36.8 C)   TempSrc:   Oral   SpO2:   99% 99%  Weight:   73.2 kg     Physical Exam  Constitutional: She appears well-developed and well-nourished. No distress.  HENT:  Head: Normocephalic.  Dry mucous membranes  Eyes: Pupils are equal, round, and reactive to light. EOM are normal.  Neck: Neck supple.  Cardiovascular: Normal rate, regular rhythm and intact distal pulses.  Pulmonary/Chest: Effort normal and breath sounds normal. No respiratory distress. She has no wheezes. She has no rales.  Abdominal: Soft. Bowel sounds are normal. She exhibits no distension. There is no tenderness.  Musculoskeletal: She exhibits no edema.  Neurological:  Awake and alert Oriented to self only  (baseline per daughter) Following commands appropriately and talking to her daughter. Strength 5 out of 5 in bilateral upper and lower extremities. Sensation to light touch intact throughout.  Skin: She is not diaphoretic.  Psychiatric: She has a normal mood and affect. Her behavior is normal.     Labs on Admission: I have personally reviewed following labs and imaging studies  CBC: Recent Labs  Lab 06/15/18 1737 06/15/18 1749  WBC 6.4  --   NEUTROABS 3.5  --   HGB 12.1 13.3  HCT 39.7 39.0  MCV 97.1  --   PLT 307  --    Basic Metabolic Panel: Recent Labs  Lab 06/15/18 1737 06/15/18 1749  NA 141 140  K 4.7 4.7  CL 105 104  CO2 27  --   GLUCOSE 134* 131*  BUN 35* 39*  CREATININE 1.74*  1.60*  CALCIUM 10.0  --    GFR: CrCl cannot be calculated (Unknown ideal weight.). Liver Function Tests: Recent Labs  Lab 06/15/18 1737  AST 17  ALT 11  ALKPHOS 82  BILITOT 0.5  PROT 6.7  ALBUMIN 3.8   No results for input(s): LIPASE, AMYLASE in the last 168 hours. No results for input(s): AMMONIA in the last 168 hours. Coagulation Profile: Recent Labs  Lab 06/15/18 1737  INR 0.90   Cardiac Enzymes: No results for input(s): CKTOTAL, CKMB, CKMBINDEX, TROPONINI in the last 168 hours. BNP (last 3 results) No results for input(s): PROBNP in the last 8760 hours. HbA1C: No results for input(s): HGBA1C in the last 72 hours. CBG: Recent Labs  Lab 06/15/18 1812  GLUCAP 108*   Lipid Profile: No results for input(s): CHOL, HDL, LDLCALC, TRIG, CHOLHDL, LDLDIRECT in the last 72 hours. Thyroid Function Tests: No results for input(s): TSH, T4TOTAL, FREET4, T3FREE, THYROIDAB in the last 72 hours. Anemia Panel: No results for input(s): VITAMINB12, FOLATE, FERRITIN, TIBC, IRON, RETICCTPCT in the last 72 hours. Urine analysis: No results found for: COLORURINE, APPEARANCEUR, LABSPEC, PHURINE, GLUCOSEU, HGBUR, BILIRUBINUR, KETONESUR, PROTEINUR, UROBILINOGEN, NITRITE,  LEUKOCYTESUR  Radiological Exams on Admission: Ct Head Wo Contrast  Result Date: 06/15/2018 CLINICAL DATA:  84 year old female with intracranial bleed. Alzheimer's. Subsequent encounter. EXAM: CT HEAD WITHOUT CONTRAST TECHNIQUE: Contiguous axial images were obtained from the base of the skull through the vertex without intravenous contrast. COMPARISON:  06/15/2018 2:02 p.m.  05/07/2018. FINDINGS: Brain: Broad-based bilateral subdural hematomas measuring up to 13 mm on the right and 11 mm on left. Local mass effect without midline shift. Focal blood anterior right middle cranial fossa measuring up to 11 mm. Chronic microvascular changes. Global atrophy. No intracranial mass lesion noted on this unenhanced exam. Vascular: Vascular calcifications Skull: No skull fracture. Sinuses/Orbits: Post lens replacement. No acute orbital abnormality. Partial opacification left sphenoid sinus. Other: Mastoid air cells and middle ear cavities are clear. IMPRESSION: 1. Overall no significant change since exam performed earlier today although significant change compared to 05/07/2018. 2. Broad-based bilateral subdural hematomas measuring up to 13 mm on the right and 11 mm on left. Local mass effect without midline shift. 3. Focal blood collection anterior right middle cranial fossa measuring up to 11 mm. This may simply represent clotted blood but is immediately adjacent to the right middle cerebral artery bifurcation and thrombosed aneurysm cannot be entirely excluded. 4. Chronic microvascular changes. Global atrophy. 5. Partial opacification left sphenoid sinus. Electronically Signed   By: Genia Del M.D.   On: 06/15/2018 19:48    Assessment/Plan Principal Problem:   Subdural hematoma (HCC) Active Problems:   HTN (hypertension)   AKI (acute kidney injury) ([place])   Alzheimer's dementia (HCC)   At risk for falls   Depression   HLD (hyperlipidemia)   Type 2 diabetes mellitus (HCC)   Hypothyroidism   Bilateral  subdural hematomas Patient has history of advanced dementia and is currently at her baseline mental status.  She is not complaining of headaches.  No nausea or vomiting.  Appears very comfortable on exam.  Hemoglobin stable at 12.1, repeat 13.3.  INR 0.9. Head CT with evidence of bilateral subdural hematomas, local mass-effect without midline shift, significant change compared to CT done on May 07, 2018.  Also showing focal blood collection in the anterior right middle cranial fossa measuring up to 11 mm, may represent clotted blood versus thrombosed aneurysm.   -ED provider spoke to Dr. Vertell Limber, neurosurgery will  consult on the patient in the morning. -Monitor on telemetry -Frequent neurochecks -Hold home aspirin, no anticoagulation for DVT prophylaxis -Keep n.p.o. after midnight. Pending neurosurgery evaluation and discussion with daughter. -Fall precautions; sitter at bedside  HTN Systolic currently in the 160s. -Continue home amlodipine -Hold home chlorthalidone and benazepril in the setting of elevated creatinine  ?AKI vs progression of CKD BUN 35, creatinine 1.7.  Repeat creatinine 1.6.  No baseline in the chart.  Appears slightly dry on exam. -IV fluid hydration -Repeat BMP in a.m. -Avoid nephrotoxic agents/contrast -Renal ultrasound -Monitor intake and output -Request records from PCPs office.  Alzheimer's dementia -Continue home donezepil, Namenda -Fall precautions; sitter at bedside -Delirium precautions  History of falls -PT evaluation  Depression -Continue home Lexapro  Hyperlipidemia -Continue home Lipitor, cholestyramine  Type 2 diabetes -Sliding scale insulin sensitive -CBG checks  Hypothyroidism -Continue home Synthroid  DVT prophylaxis: SCDs Code Status: Daughter states patient has previously mentioned she would want to be full code.  Daughter wants patient to be full code. Family Communication: Daughter at bedside Disposition Plan: Anticipate  discharge to nursing home in 1 to 2 days. Consults called: Neurosurgery Admission status: Observation   Shela Leff MD [facility] Pager [phone]  If 7PM-7AM, please contact night-coverage [email or web address] Password [email or web address]  06/16/2018, 3:15 AM

## 2018-06-16 NOTE — Plan of Care (Signed)
Patient will be discharged and taken by daughter, [name], back to [facility].

## 2018-06-16 NOTE — Social Work (Addendum)
Clinical Social Worker facilitated patient discharge including contacting patient family and facility to confirm patient discharge plans.  Clinical information faxed to facility and family agreeable with plan.  Pt daughter will transfer via personal car to [facility]. RN to call [phone]  with report prior to discharge.  Clinical Social Worker will sign off for now as social work intervention is no longer needed. Please consult [place] again if new need arises.  Westley Hummer, MSW, [place] Social Worker [phone]

## 2018-06-16 NOTE — Discharge Summary (Signed)
Physician Discharge Summary  Bianca Wilson ZOX:[id number] DOB: 1931-03-19 DOA: 06/15/2018  PCP: Bianca Number, MD  Admit date: 06/15/2018 Discharge date: 06/16/2018  Time spent: 40 minutes  Recommendations for Outpatient Follow-up:  1. Follow up with PCP 2-3 weeks for evaluation of functionality, kidney function, code status 2. Back to facility with PT [facility] 3. Recommend Palliative care consult for goals of care   Discharge Diagnoses:  Principal Problem:   Subdural hematoma ([place]) Active Problems:   HTN (hypertension)   AKI (acute kidney injury) ([place])   Alzheimer's dementia ([place])   At risk for falls   Depression   HLD (hyperlipidemia)   Type 2 diabetes mellitus ([place])   Hypothyroidism   Discharge Condition: stable  Diet recommendation: heart healthy carb modified  Filed Weights   06/15/18 2325  Weight: 73.2 kg    History of present illness:  Bianca Wilson is a 84 y.o. female with medical history significant of Alzheimer's dementia sent to the hospital by her PCPs on 06/15/18 for evaluation of abnormal head CT.  Patient at baseline advanced dementia and is oriented to self only.  She reported feeling well and had no complaints.  Denied having any headaches.  History was provided by daughter at bedside.  Daughter stated patient had a fall on October 12 and injured her head.  She was taken to [facility] at that time and was admitted to the ICU for several days.  She was then discharged to [facility] and fell again in 2 weeks and injured the left side of her forehead.  Stated patient went to see her neurologist today and a CT of her head was obtained.  Daughter was told by the neurologist that the CT showed bleeding in the patient's head and patient should be taken to the hospital for evaluation.  Per daughter, no recent falls reported by nursing home. Patient takes an aspirin daily.  Daughter does not want patient  patient to undergo brain  surgery.  She believes the patient looks comfortable and is currently at her baseline mental status.  Daughter is requesting a sitter at bedside as patient has a history of getting up from the bed and walking away during her prior hospitalizations which puts her at risk for falls.   Hospital Course:  Bilateral subdural hematomas per CT after recent falls with hitting head. Patient has history of advanced dementia and was at her baseline mental status at presentation.  No complaints of nausea or vomiting.  Hemoglobin stable at 12.1, repeat 13.3.  INR 0.9. Head CT with evidence of bilateral subdural hematomas, local mass-effect without midline shift, significant change compared to CT done on May 07, 2018.  Also showing focal blood collection in the anterior right middle cranial fossa measuring up to 11 mm, may represent clotted blood versus thrombosed aneurysm.   I spoke with Dr Donald Pore RN today who confirmed Dr Vertell Limber not consulting unless family decides they want surgery which is what he tried to convey last night. I confirmed with family that no surgery is wanted. Evaluated by PT who recommend 24/7 supervision  HTN: controlled  ?AKI vs progression of CKD BUN 35, creatinine 1.7.  Repeat creatinine 1.6.  No baseline in the chart.  Appeared slightly dry on exam. Provided with IV fluids. Renal US reveals Normal renal size. Complex cyst left kidney 4 x 5 cm. Negative for hydronephrosis. Recommend OP follow up  Alzheimer's dementia. Advanced. Continue fall precautions. Recommend Palliative care consult for goals  of care discussion   Depression -Continue home Lexapro  Hyperlipidemia -Continue home Lipitor, cholestyramine  Type 2 diabetes. Fair control  Hypothyroidism -Continue home Synthroid   Procedures:  Renal US  Consultations:    Discharge Exam: Vitals:   06/16/18 0815 06/16/18 1156  BP: (!) 158/58 (!) 153/55  Pulse: 80 (!) 53  Resp: 14 13  Temp: 98.1 F (36.7 C)  98.5 F (36.9 C)  SpO2: 100% 100%    General: well nourished awake alert in no acute distress Cardiovascular: rrr no mgr no LE edema Respiratory: normal effort BS clear bilaterally no wheeze Abdomen: obese soft +BS no guarding or rebounding Neuro: alert oriented to self. Moves all extremities, able to follow simple commands  Discharge Instructions    Allergies as of 06/16/2018      Reactions   Diphenhydramine-acetaminophen    Other reaction(s): Delusions (intolerance)   Other Other (See Comments)   Muscles Relaxers ,Narcotics       Medication List    TAKE these medications   ACIDOPHILUS PO Take 1 capsule by mouth daily.   alendronate 70 MG tablet Commonly known as:  FOSAMAX Take 70 mg by mouth every Sunday. Take with a full glass of water on an empty stomach.   amLODipine-benazepril 5-40 MG capsule Commonly known as:  LOTREL Take 1 capsule by mouth daily.   aspirin 81 MG chewable tablet Chew 81 mg by mouth daily.   atorvastatin 20 MG tablet Commonly known as:  LIPITOR Take 20 mg by mouth daily.   Biotin 1000 MCG tablet Take 1,000 mcg by mouth every morning.   CALCIUM 500 +D 500-400 MG-UNIT Tabs Generic drug:  Calcium Carb-Cholecalciferol Take 1 tablet by mouth 2 (two) times daily.   chlorthalidone 25 MG tablet Commonly known as:  HYGROTON Take 25 mg by mouth daily.   cholestyramine 4 g packet Commonly known as:  QUESTRAN Take 4 g by mouth daily.   CRANBERRY PO Take 1 capsule by mouth daily.   donepezil 10 MG tablet Commonly known as:  ARICEPT Take 10 mg by mouth at bedtime.   escitalopram 20 MG tablet Commonly known as:  LEXAPRO Take 20 mg by mouth daily.   ferrous sulfate 325 (65 FE) MG tablet Take 325 mg by mouth daily with breakfast.   levothyroxine 25 MCG tablet Commonly known as:  SYNTHROID, LEVOTHROID Take 25 mcg by mouth daily before breakfast.   memantine 10 MG tablet Commonly known as:  NAMENDA Take 10 mg by mouth 2 (two) times  daily.   metFORMIN 500 MG tablet Commonly known as:  GLUCOPHAGE Take 500 mg by mouth 2 (two) times daily with a meal.   trimethoprim 100 MG tablet Commonly known as:  TRIMPEX Take 100 mg by mouth daily.   VITAMIN B12 PO Take 1 tablet by mouth daily.   vitamin C 500 MG tablet Commonly known as:  ASCORBIC ACID Take 500 mg by mouth daily.      Allergies  Allergen Reactions  . Diphenhydramine-Acetaminophen     Other reaction(s): Delusions (intolerance)  . Other Other (See Comments)    Muscles Relaxers ,Narcotics       The results of significant diagnostics from this hospitalization (including imaging, microbiology, ancillary and laboratory) are listed below for reference.    Significant Diagnostic Studies: Ct Head Wo Contrast  Result Date: 06/15/2018 CLINICAL DATA:  84 year old female with intracranial bleed. Alzheimer's. Subsequent encounter. EXAM: CT HEAD WITHOUT CONTRAST TECHNIQUE: Contiguous axial images were obtained from the base of  the skull through the vertex without intravenous contrast. COMPARISON:  06/15/2018 2:02 p.m.  05/07/2018. FINDINGS: Brain: Broad-based bilateral subdural hematomas measuring up to 13 mm on the right and 11 mm on left. Local mass effect without midline shift. Focal blood anterior right middle cranial fossa measuring up to 11 mm. Chronic microvascular changes. Global atrophy. No intracranial mass lesion noted on this unenhanced exam. Vascular: Vascular calcifications Skull: No skull fracture. Sinuses/Orbits: Post lens replacement. No acute orbital abnormality. Partial opacification left sphenoid sinus. Other: Mastoid air cells and middle ear cavities are clear. IMPRESSION: 1. Overall no significant change since exam performed earlier today although significant change compared to 05/07/2018. 2. Broad-based bilateral subdural hematomas measuring up to 13 mm on the right and 11 mm on left. Local mass effect without midline shift. 3. Focal blood collection  anterior right middle cranial fossa measuring up to 11 mm. This may simply represent clotted blood but is immediately adjacent to the right middle cerebral artery bifurcation and thrombosed aneurysm cannot be entirely excluded. 4. Chronic microvascular changes. Global atrophy. 5. Partial opacification left sphenoid sinus. Electronically Signed   By: Genia Del M.D.   On: 06/15/2018 19:48   US Renal  Result Date: 06/16/2018 CLINICAL DATA:  Acute kidney injury EXAM: RENAL / URINARY TRACT ULTRASOUND COMPLETE COMPARISON:  None. FINDINGS: Right Kidney: Renal measurements: 10.2 x 3.9 x 4.5 cm = volume: 91 mL. 2 cm lower pole cyst. Negative for hydronephrosis. Left Kidney: Renal measurements: 10.6 x 6.7 x 5.7 cm = volume: 212 mL. Renal size and volume measurement includes left renal cyst measuring 4 x 5 cm with layering debris. Negative for hydronephrosis. Bladder: Normal appearing bladder.  Bilateral ureteral jets. IMPRESSION: Normal renal size. Complex cyst left kidney 4 x 5 cm. Negative for hydronephrosis Electronically Signed   By: Franchot Gallo M.D.   On: 06/16/2018 10:31    Microbiology: Recent Results (from the past 240 hour(s))  MRSA PCR Screening     Status: None   Collection Time: 06/16/18  5:50 AM  Result Value Ref Range Status   MRSA by PCR NEGATIVE NEGATIVE Final    Comment:        The GeneXpert MRSA Assay (FDA approved for NASAL specimens only), is one component of a comprehensive MRSA colonization surveillance program. It is not intended to diagnose MRSA infection nor to guide or monitor treatment for MRSA infections. Performed at [facility], [place] [address], [place], [place] [address]      Labs: Basic Metabolic Panel: Recent Labs  Lab 06/15/18 1737 06/15/18 1749  NA 141 140  K 4.7 4.7  CL 105 104  CO2 27  --   GLUCOSE 134* 131*  BUN 35* 39*  CREATININE 1.74* 1.60*  CALCIUM 10.0  --    Liver Function Tests: Recent Labs  Lab 06/15/18 1737  AST 17  ALT  11  ALKPHOS 82  BILITOT 0.5  PROT 6.7  ALBUMIN 3.8   No results for input(s): LIPASE, AMYLASE in the last 168 hours. No results for input(s): AMMONIA in the last 168 hours. CBC: Recent Labs  Lab 06/15/18 1737 06/15/18 1749  WBC 6.4  --   NEUTROABS 3.5  --   HGB 12.1 13.3  HCT 39.7 39.0  MCV 97.1  --   PLT 307  --    Cardiac Enzymes: No results for input(s): CKTOTAL, CKMB, CKMBINDEX, TROPONINI in the last 168 hours. BNP: BNP (last 3 results) No results for input(s): BNP in the  last 8760 hours.  ProBNP (last 3 results) No results for input(s): PROBNP in the last 8760 hours.  CBG: Recent Labs  Lab 06/15/18 1812 06/16/18 0812 06/16/18 1201  GLUCAP 108* 118* 120*       Signed:  Radene Gunning NP.  [facility] 06/16/2018, 12:36 PM

## 2018-06-16 NOTE — Progress Notes (Signed)
Physical Therapy Treatment Patient Details Name: Bianca Wilson MRN: [id number] DOB: 1930-07-17 Today's Date: 06/16/2018    History of Present Illness Bianca Wilson is a 84 y.o. female with medical history significant of Alzheimer's dementia been sent to the hospital by her PCPs office for evaluation of abnormal head CT.  Patient has baseline advanced dementia and is oriented to self only at baseline. Dtr reports pt fell on 10/12 and injured her head and then again 2 weeks later and injured her L forehead, large laceration.    PT Comments    Pt admitted with above. Pt baseline cognition and near for functional baseline. Pt cont to require 24/7 assist due to impulsivity, decreased safety awareness, impaired cognition, impaired balance, and history of falls. Pt remains appropriate to return to [facility] when medically appropriate.    Follow Up Recommendations  No PT follow up;Supervision/Assistance - 24 hour     Equipment Recommendations  None recommended by PT    Recommendations for Other Services       Precautions / Restrictions Precautions Precautions: Fall Precaution Comments: severe alzheimers Restrictions Weight Bearing Restrictions: No    Mobility  Bed Mobility Overal bed mobility: Needs Assistance Bed Mobility: Rolling;Sidelying to Sit Rolling: Min guard Sidelying to sit: Min assist       General bed mobility comments: pt impulsive and quick, assist for safety, minA for trunk elevation  Transfers Overall transfer level: Needs assistance Equipment used: Rolling walker (2 wheeled) Transfers: Sit to/from [facility] Sit to Stand: Min assist;Min guard Stand pivot transfers: Min guard;Min assist       General transfer comment: pt impulsive and initiates  transfer, minA for walker management and verbal cues for safety.    Ambulation/Gait Ambulation/Gait assistance: Min assist;+2 safety/equipment Gait Distance (Feet): 200 Feet Assistive device:  Rolling walker (2 wheeled) Gait Pattern/deviations: WFL(Within Functional Limits) Gait velocity: impulsively fast Gait velocity interpretation: 1.31 - 2.62 ft/sec, indicative of limited community ambulator General Gait Details: pt very fast, pt states this is her baseline, max directional verbal cues, pt with onset of fatigue by the end of 200', dtr assist with walker managemnet to prevent increased speed while PT managed patient and lines   Stairs             Wheelchair Mobility    Modified Rankin (Stroke Patients Only)       Balance Overall balance assessment: Needs assistance Sitting-balance support: Feet supported;No upper extremity supported Sitting balance-Leahy Scale: Fair     Standing balance support: No upper extremity supported Standing balance-Leahy Scale: Fair Standing balance comment: pt requires assist for safety due to impulsivity and decreased insight to deficits                            Cognition Arousal/Alertness: Awake/alert Behavior During Therapy: Impulsive Overall Cognitive Status: History of cognitive impairments - at baseline                                 General Comments: pt typically only oriented to self, decreased safety awareness, and insight to deficits      Exercises      General Comments General comments (skin integrity, edema, etc.): pt assisted to [facility], pt dependent for hygiene      Pertinent Vitals/Pain Pain Assessment: No/denies pain    Home Living Family/patient expects to be discharged to:: Assisted living  Home Equipment: Walker - 2 wheels Additional Comments: pt resideds at [facility]    Prior Function Level of Independence: Needs assistance  Gait / Transfers Assistance Needed: daughter reports pt ambulates with RW everywhere, she ambulates to fast/runs ADL's / [place] Needed: Tax inspector supervises pt with bathing/dressing and is starting to with  ambulation due to increased falls Comments: pt poor historian, dtr very helpful and provided PLOF   PT Goals (current goals can now be found in the care plan section) Acute Rehab PT Goals Patient Stated Goal: didn't state PT Goal Formulation: With patient/family Time For Goal Achievement: 06/30/18 Potential to Achieve Goals: Good    Frequency    Min 2X/week      PT Plan      Co-evaluation              AM-PAC PT "6 Clicks" Mobility   Outcome Measure  Help needed turning from your back to your side while in a flat bed without using bedrails?: A Little Help needed moving from lying on your back to sitting on the side of a flat bed without using bedrails?: A Little Help needed moving to and from a bed to a chair (including a wheelchair)?: A Little Help needed standing up from a chair using your arms (e.g., wheelchair or bedside chair)?: A Little Help needed to walk in hospital room?: A Little Help needed climbing 3-5 steps with a railing? : A Lot 6 Click Score: 17    End of Session Equipment Utilized During Treatment: Gait belt Activity Tolerance: Patient tolerated treatment well Patient left: in bed;with call bell/phone within reach;with bed alarm set Nurse Communication: Mobility status PT Visit Diagnosis: Unsteadiness on feet (R26.81)     Time: 6387-5643 PT Time Calculation (min) (ACUTE ONLY): 33 min  Charges:  $Gait Training: 8-22 mins                     Kittie Plater, PT, DPT Acute Rehabilitation Services Pager #: [phone] Office #: [phone]    Berline Lopes 06/16/2018, 10:07 AM

## 2018-06-23 DIAGNOSIS — N183 Chronic kidney disease, stage 3 (moderate): Secondary | ICD-10-CM | POA: Diagnosis not present

## 2018-06-23 DIAGNOSIS — I10 Essential (primary) hypertension: Secondary | ICD-10-CM | POA: Diagnosis not present

## 2018-06-23 DIAGNOSIS — S065X9A Traumatic subdural hemorrhage with loss of consciousness of unspecified duration, initial encounter: Secondary | ICD-10-CM | POA: Diagnosis not present

## 2018-06-23 DIAGNOSIS — F0391 Unspecified dementia with behavioral disturbance: Secondary | ICD-10-CM | POA: Diagnosis not present

## 2018-06-29 DIAGNOSIS — E1149 Type 2 diabetes mellitus with other diabetic neurological complication: Secondary | ICD-10-CM | POA: Diagnosis not present

## 2018-06-29 DIAGNOSIS — F39 Unspecified mood [affective] disorder: Secondary | ICD-10-CM | POA: Diagnosis not present

## 2018-06-29 DIAGNOSIS — F0391 Unspecified dementia with behavioral disturbance: Secondary | ICD-10-CM | POA: Diagnosis not present

## 2018-06-29 DIAGNOSIS — I10 Essential (primary) hypertension: Secondary | ICD-10-CM | POA: Diagnosis not present

## 2018-06-30 DIAGNOSIS — E119 Type 2 diabetes mellitus without complications: Secondary | ICD-10-CM | POA: Diagnosis not present

## 2018-06-30 DIAGNOSIS — D518 Other vitamin B12 deficiency anemias: Secondary | ICD-10-CM | POA: Diagnosis not present

## 2018-06-30 DIAGNOSIS — Z79899 Other long term (current) drug therapy: Secondary | ICD-10-CM | POA: Diagnosis not present

## 2018-06-30 DIAGNOSIS — E7849 Other hyperlipidemia: Secondary | ICD-10-CM | POA: Diagnosis not present

## 2018-07-01 ENCOUNTER — Emergency Department (HOSPITAL_COMMUNITY): Payer: MEDICARE

## 2018-07-01 ENCOUNTER — Inpatient Hospital Stay (HOSPITAL_COMMUNITY)
Admission: EM | Admit: 2018-07-01 | Discharge: 2018-07-09 | DRG: 082 | Disposition: A | Discharge status: Skilled Nursing Facility | Payer: MEDICARE | Attending: Internal Medicine | Admitting: Internal Medicine

## 2018-07-01 ENCOUNTER — Encounter (HOSPITAL_COMMUNITY): Payer: Self-pay

## 2018-07-01 ENCOUNTER — Other Ambulatory Visit: Payer: Self-pay

## 2018-07-01 DIAGNOSIS — Y92129 Unspecified place in nursing home as the place of occurrence of the external cause: Secondary | ICD-10-CM

## 2018-07-01 DIAGNOSIS — R404 Transient alteration of awareness: Secondary | ICD-10-CM | POA: Diagnosis not present

## 2018-07-01 DIAGNOSIS — Z515 Encounter for palliative care: Secondary | ICD-10-CM

## 2018-07-01 DIAGNOSIS — Z794 Long term (current) use of insulin: Secondary | ICD-10-CM

## 2018-07-01 DIAGNOSIS — S065XAA Traumatic subdural hemorrhage with loss of consciousness status unknown, initial encounter: Secondary | ICD-10-CM | POA: Diagnosis present

## 2018-07-01 DIAGNOSIS — Z7984 Long term (current) use of oral hypoglycemic drugs: Secondary | ICD-10-CM | POA: Diagnosis not present

## 2018-07-01 DIAGNOSIS — S2242XA Multiple fractures of ribs, left side, initial encounter for closed fracture: Secondary | ICD-10-CM | POA: Diagnosis present

## 2018-07-01 DIAGNOSIS — Z9181 History of falling: Secondary | ICD-10-CM | POA: Diagnosis not present

## 2018-07-01 DIAGNOSIS — F0280 Dementia in other diseases classified elsewhere without behavioral disturbance: Secondary | ICD-10-CM | POA: Diagnosis present

## 2018-07-01 DIAGNOSIS — Z7951 Long term (current) use of inhaled steroids: Secondary | ICD-10-CM | POA: Diagnosis not present

## 2018-07-01 DIAGNOSIS — Z79899 Other long term (current) drug therapy: Secondary | ICD-10-CM

## 2018-07-01 DIAGNOSIS — S065X9A Traumatic subdural hemorrhage with loss of consciousness of unspecified duration, initial encounter: Principal | ICD-10-CM | POA: Diagnosis present

## 2018-07-01 DIAGNOSIS — S065X0A Traumatic subdural hemorrhage without loss of consciousness, initial encounter: Secondary | ICD-10-CM | POA: Diagnosis not present

## 2018-07-01 DIAGNOSIS — R9401 Abnormal electroencephalogram [EEG]: Secondary | ICD-10-CM | POA: Diagnosis present

## 2018-07-01 DIAGNOSIS — E038 Other specified hypothyroidism: Secondary | ICD-10-CM | POA: Diagnosis not present

## 2018-07-01 DIAGNOSIS — N179 Acute kidney failure, unspecified: Secondary | ICD-10-CM | POA: Diagnosis present

## 2018-07-01 DIAGNOSIS — E119 Type 2 diabetes mellitus without complications: Secondary | ICD-10-CM | POA: Diagnosis present

## 2018-07-01 DIAGNOSIS — Z7189 Other specified counseling: Secondary | ICD-10-CM

## 2018-07-01 DIAGNOSIS — R58 Hemorrhage, not elsewhere classified: Secondary | ICD-10-CM | POA: Diagnosis not present

## 2018-07-01 DIAGNOSIS — S0990XA Unspecified injury of head, initial encounter: Secondary | ICD-10-CM | POA: Diagnosis not present

## 2018-07-01 DIAGNOSIS — I959 Hypotension, unspecified: Secondary | ICD-10-CM | POA: Diagnosis not present

## 2018-07-01 DIAGNOSIS — E039 Hypothyroidism, unspecified: Secondary | ICD-10-CM | POA: Diagnosis present

## 2018-07-01 DIAGNOSIS — Z7983 Long term (current) use of bisphosphonates: Secondary | ICD-10-CM

## 2018-07-01 DIAGNOSIS — R1310 Dysphagia, unspecified: Secondary | ICD-10-CM | POA: Diagnosis present

## 2018-07-01 DIAGNOSIS — G301 Alzheimer's disease with late onset: Secondary | ICD-10-CM | POA: Diagnosis not present

## 2018-07-01 DIAGNOSIS — W19XXXA Unspecified fall, initial encounter: Secondary | ICD-10-CM | POA: Diagnosis present

## 2018-07-01 DIAGNOSIS — R52 Pain, unspecified: Secondary | ICD-10-CM | POA: Diagnosis not present

## 2018-07-01 DIAGNOSIS — E785 Hyperlipidemia, unspecified: Secondary | ICD-10-CM | POA: Diagnosis present

## 2018-07-01 DIAGNOSIS — G309 Alzheimer's disease, unspecified: Secondary | ICD-10-CM | POA: Diagnosis present

## 2018-07-01 DIAGNOSIS — Z7401 Bed confinement status: Secondary | ICD-10-CM | POA: Diagnosis not present

## 2018-07-01 DIAGNOSIS — S065X0D Traumatic subdural hemorrhage without loss of consciousness, subsequent encounter: Secondary | ICD-10-CM | POA: Diagnosis not present

## 2018-07-01 DIAGNOSIS — I6200 Nontraumatic subdural hemorrhage, unspecified: Secondary | ICD-10-CM | POA: Diagnosis not present

## 2018-07-01 DIAGNOSIS — R4702 Dysphasia: Secondary | ICD-10-CM | POA: Diagnosis not present

## 2018-07-01 DIAGNOSIS — F0390 Unspecified dementia without behavioral disturbance: Secondary | ICD-10-CM | POA: Diagnosis not present

## 2018-07-01 DIAGNOSIS — M2550 Pain in unspecified joint: Secondary | ICD-10-CM | POA: Diagnosis not present

## 2018-07-01 DIAGNOSIS — I629 Nontraumatic intracranial hemorrhage, unspecified: Secondary | ICD-10-CM | POA: Diagnosis not present

## 2018-07-01 DIAGNOSIS — Z888 Allergy status to other drugs, medicaments and biological substances status: Secondary | ICD-10-CM | POA: Diagnosis not present

## 2018-07-01 DIAGNOSIS — R531 Weakness: Secondary | ICD-10-CM | POA: Diagnosis not present

## 2018-07-01 DIAGNOSIS — I10 Essential (primary) hypertension: Secondary | ICD-10-CM | POA: Diagnosis present

## 2018-07-01 DIAGNOSIS — G300 Alzheimer's disease with early onset: Secondary | ICD-10-CM | POA: Diagnosis not present

## 2018-07-01 DIAGNOSIS — G9341 Metabolic encephalopathy: Secondary | ICD-10-CM | POA: Diagnosis present

## 2018-07-01 DIAGNOSIS — S065X9D Traumatic subdural hemorrhage with loss of consciousness of unspecified duration, subsequent encounter: Secondary | ICD-10-CM | POA: Diagnosis not present

## 2018-07-01 DIAGNOSIS — F0281 Dementia in other diseases classified elsewhere with behavioral disturbance: Secondary | ICD-10-CM | POA: Diagnosis not present

## 2018-07-01 LAB — DIFFERENTIAL
Abs Immature Granulocytes: 0.03 10*3/uL (ref 0.00–0.07)
Basophils Absolute: 0 10*3/uL (ref 0.0–0.1)
Basophils Relative: 0 %
Eosinophils Absolute: 0.1 10*3/uL (ref 0.0–0.5)
Eosinophils Relative: 2 %
Immature Granulocytes: 0 %
Lymphocytes Relative: 24 %
Lymphs Abs: 1.6 10*3/uL (ref 0.7–4.0)
MONOS PCT: 10 %
Monocytes Absolute: 0.7 10*3/uL (ref 0.1–1.0)
Neutro Abs: 4.2 10*3/uL (ref 1.7–7.7)
Neutrophils Relative %: 64 %

## 2018-07-01 LAB — RAPID URINE DRUG SCREEN, HOSP PERFORMED
Amphetamines: NOT DETECTED
BARBITURATES: NOT DETECTED
Benzodiazepines: NOT DETECTED
Cocaine: NOT DETECTED
Opiates: NOT DETECTED
Tetrahydrocannabinol: NOT DETECTED

## 2018-07-01 LAB — CBC
HCT: 38 % (ref 36.0–46.0)
Hemoglobin: 12.1 g/dL (ref 12.0–15.0)
MCH: 29.7 pg (ref 26.0–34.0)
MCHC: 31.8 g/dL (ref 30.0–36.0)
MCV: 93.4 fL (ref 80.0–100.0)
Platelets: 291 10*3/uL (ref 150–400)
RBC: 4.07 MIL/uL (ref 3.87–5.11)
RDW: 12.5 % (ref 11.5–15.5)
WBC: 6.7 10*3/uL (ref 4.0–10.5)
nRBC: 0 % (ref 0.0–0.2)

## 2018-07-01 LAB — COMPREHENSIVE METABOLIC PANEL
ALT: 12 U/L (ref 0–44)
AST: 18 U/L (ref 15–41)
Albumin: 3.6 g/dL (ref 3.5–5.0)
Alkaline Phosphatase: 77 U/L (ref 38–126)
Anion gap: 13 (ref 5–15)
BUN: 25 mg/dL — ABNORMAL HIGH (ref 8–23)
CO2: 26 mmol/L (ref 22–32)
CREATININE: 1.18 mg/dL — AB (ref 0.44–1.00)
Calcium: 10 mg/dL (ref 8.9–10.3)
Chloride: 98 mmol/L (ref 98–111)
GFR calc Af Amer: 49 mL/min — ABNORMAL LOW (ref >60)
GFR, EST NON AFRICAN AMERICAN: 42 mL/min — AB (ref >60)
Glucose, Bld: 118 mg/dL — ABNORMAL HIGH (ref 70–99)
Potassium: 4.1 mmol/L (ref 3.5–5.1)
Sodium: 137 mmol/L (ref 135–145)
Total Bilirubin: 0.5 mg/dL (ref 0.3–1.2)
Total Protein: 6.5 g/dL (ref 6.5–8.1)

## 2018-07-01 LAB — URINALYSIS, ROUTINE W REFLEX MICROSCOPIC
Bilirubin Urine: NEGATIVE
Glucose, UA: NEGATIVE mg/dL
HGB URINE DIPSTICK: NEGATIVE
Ketones, ur: NEGATIVE mg/dL
Nitrite: NEGATIVE
Protein, ur: NEGATIVE mg/dL
Specific Gravity, Urine: <1.005 — ABNORMAL LOW (ref 1.005–1.030)
pH: 5 (ref 5.0–8.0)

## 2018-07-01 LAB — I-STAT CHEM 8, ED
BUN: 27 mg/dL — ABNORMAL HIGH (ref 8–23)
Calcium, Ion: 1.26 mmol/L (ref 1.15–1.40)
Chloride: 100 mmol/L (ref 98–111)
Creatinine, Ser: 1.2 mg/dL — ABNORMAL HIGH (ref 0.44–1.00)
Glucose, Bld: 114 mg/dL — ABNORMAL HIGH (ref 70–99)
HCT: 36 % (ref 36.0–46.0)
HEMOGLOBIN: 12.2 g/dL (ref 12.0–15.0)
POTASSIUM: 4 mmol/L (ref 3.5–5.1)
Sodium: 137 mmol/L (ref 135–145)
TCO2: 30 mmol/L (ref 22–32)

## 2018-07-01 LAB — URINALYSIS, MICROSCOPIC (REFLEX)
RBC / HPF: NONE SEEN RBC/hpf (ref 0–5)
Squamous Epithelial / HPF: NONE SEEN (ref 0–5)

## 2018-07-01 LAB — PROTIME-INR
INR: 0.92
Prothrombin Time: 12.3 seconds (ref 11.4–15.2)

## 2018-07-01 LAB — I-STAT TROPONIN, ED: Troponin i, poc: 0.01 ng/mL (ref 0.00–0.08)

## 2018-07-01 LAB — ETHANOL: Alcohol, Ethyl (B): <10 mg/dL (ref <10)

## 2018-07-01 LAB — APTT: APTT: 28 s (ref 24–36)

## 2018-07-01 MED ORDER — LEVOTHYROXINE SODIUM 25 MCG PO TABS: 25.0000 ug | ORAL_TABLET | Freq: Every day | ORAL | Status: DC

## 2018-07-01 MED ORDER — SODIUM CHLORIDE 0.9 % IV SOLN
INTRAVENOUS | Status: DC
  Administered 2018-07-01 – 2018-07-08 (×9): via INTRAVENOUS
  Administered 2018-07-09: 1000 mL via INTRAVENOUS

## 2018-07-01 MED ORDER — AMLODIPINE BESYLATE 10 MG PO TABS
10.0000 mg | ORAL_TABLET | Freq: Every day | ORAL | Status: DC
  Administered 2018-07-03 – 2018-07-09 (×7): 10 mg via ORAL
  Filled 2018-07-01 (×8): qty 1

## 2018-07-01 MED ORDER — LEVETIRACETAM IN NACL 500 MG/100ML IV SOLN
500.0000 mg | Freq: Two times a day (BID) | INTRAVENOUS | Status: DC
  Administered 2018-07-01 – 2018-07-05 (×8): 500 mg via INTRAVENOUS
  Filled 2018-07-01 (×8): qty 100

## 2018-07-01 MED ORDER — INSULIN ASPART 100 UNIT/ML ~~LOC~~ SOLN
0.0000 [IU] | Freq: Three times a day (TID) | SUBCUTANEOUS | Status: DC
  Administered 2018-07-02: 1 [IU] via SUBCUTANEOUS
  Administered 2018-07-03 (×2): 2 [IU] via SUBCUTANEOUS
  Administered 2018-07-03: 1 [IU] via SUBCUTANEOUS
  Administered 2018-07-04 (×2): 2 [IU] via SUBCUTANEOUS
  Administered 2018-07-05 – 2018-07-08 (×6): 1 [IU] via SUBCUTANEOUS

## 2018-07-01 NOTE — H&P (Signed)
History and Physical    Bianca Wilson DDU:[id number] DOB: April 14, 1931 DOA: 07/01/2018  PCP: Bianca Number, MD Patient coming from: [facility]  Chief Complaint: Dragging the left leg and left-sided weakness  HPI: Bianca Wilson is a 84 y.o. female with medical history significant of Alzheimer's dementia, recent hospital admission for bilateral subdural hematoma patient was admitted 06/16/2018 and discharged the same day as family did not want surgery so she was discharged back to assisted living facility.  She was asked to resume aspirin 2 weeks after discharge.  She had a fall this morning and the staff noticed that she was dragging her left foot and left leg and she was sent to the ER.  She is not on any blood thinners at this time.  Family reports that she did not have any nausea vomiting diarrhea abdominal pain chest pain shortness of breath urinary complaints fever or chills.  She was at her baseline until she had this fall.  Family reports her weakness is gotten better since this morning.  History is obtained from the patient's daughter and granddaughter.    ED Course: Patient was seen by neurosurgery.  CT of the head done today shows progression of right subdural hematoma compared to 06/15/2018.  This measures up to 27 mm in thickness associated with increased brain mass-effect including 7 mm leftward midline shift.  Contralateral left subdural hematoma has regressed to 5 to 7 mm thickness  Subacute appearing left posterior first through the third rib fracture with periosteal reaction noted  Review of Systems: See HPI  Ambulatory Status: At baseline she ambulates inside the room  Past Medical History:  Diagnosis Date  . Alzheimer disease ([place])     History reviewed. No pertinent surgical history.  Social History   Socioeconomic History  . Marital status: Married    Spouse name: Not on file  . Number of children: Not on file  . Years of education: Not on file  . Highest education  level: Not on file  Occupational History  . Not on file  Social Needs  . Financial resource strain: Not on file  . Food insecurity:    Worry: Not on file    Inability: Not on file  . Transportation needs:    Medical: Not on file    Non-medical: Not on file  Tobacco Use  . Smoking status: Never Smoker  . Smokeless tobacco: Never Used  Substance and Sexual Activity  . Alcohol use: Never    Frequency: Never  . Drug use: Never  . Sexual activity: Not on file  Lifestyle  . Physical activity:    Days per week: Not on file    Minutes per session: Not on file  . Stress: Not on file  Relationships  . Social connections:    Talks on phone: Not on file    Gets together: Not on file    Attends religious service: Not on file    Active member of club or organization: Not on file    Attends meetings of clubs or organizations: Not on file    Relationship status: Not on file  . Intimate partner violence:    Fear of current or ex partner: Not on file    Emotionally abused: Not on file    Physically abused: Not on file    Forced sexual activity: Not on file  Other Topics Concern  . Not on file  Social History Narrative  . Not on file    Allergies  Allergen  Reactions  . Diphenhydramine-Acetaminophen     Other reaction(s): Delusions (intolerance)  . Other Other (See Comments)    Muscles Relaxers ,Narcotics     History reviewed. No pertinent family history.   Prior to Admission medications   Medication Sig Start Date End Date Taking? Authorizing Provider  alendronate (FOSAMAX) 70 MG tablet Take 70 mg by mouth every Sunday. Take with a full glass of water on an empty stomach.   Yes [provider]  amLODipine (NORVASC) 10 MG tablet Take 10 mg by mouth daily. For HTN hold for systolic less than 419   Yes [provider]  atorvastatin (LIPITOR) 20 MG tablet Take 20 mg by mouth daily.   Yes [provider]  Biotin 1000 MCG tablet Take 1,000 mcg by mouth  every morning.   Yes [provider]  Calcium Carb-Cholecalciferol (CALCIUM 500 +D) 500-400 MG-UNIT TABS Take 1 tablet by mouth 2 (two) times daily.   Yes [provider]  chlorthalidone (HYGROTON) 25 MG tablet Take 25 mg by mouth daily.   Yes [provider]  cholestyramine (QUESTRAN) 4 g packet Take 4 g by mouth daily.   Yes [provider]  CRANBERRY PO Take 1 capsule by mouth daily.   Yes [provider]  Cyanocobalamin (VITAMIN B12 PO) Take 1 tablet by mouth daily.   Yes [provider]  donepezil (ARICEPT) 10 MG tablet Take 10 mg by mouth at bedtime.   Yes [provider]  escitalopram (LEXAPRO) 20 MG tablet Take 20 mg by mouth daily.   Yes [provider]  Lactobacillus (ACIDOPHILUS PO) Take 1 capsule by mouth daily.   Yes [provider]  levothyroxine (SYNTHROID, LEVOTHROID) 25 MCG tablet Take 25 mcg by mouth daily before breakfast.   Yes [provider]  loperamide (IMODIUM A-D) 2 MG tablet Take 2 mg by mouth as needed for diarrhea or loose stools.   Yes [provider]  memantine (NAMENDA) 10 MG tablet Take 10 mg by mouth 2 (two) times daily.   Yes [provider]  metFORMIN (GLUCOPHAGE) 500 MG tablet Take 500 mg by mouth 2 (two) times daily with a meal.   Yes [provider]  trimethoprim (TRIMPEX) 100 MG tablet Take 100 mg by mouth daily.   Yes [provider]  vitamin C (ASCORBIC ACID) 500 MG tablet Take 500 mg by mouth daily.   Yes [provider]    Physical Exam: Vitals:   07/01/18 1615 07/01/18 1630 07/01/18 1645 07/01/18 1730  BP: (!) 145/72 (!) 144/107 (!) 147/83 135/79  Pulse: (!) 56 60 (!) 58 61  Resp: 14 13 13 12   Temp:      TempSrc:      SpO2: 98% 100% 98% 99%  Weight:      Height:         . General:  Appears calm and comfortable . Eyes:  PERRL, EOMI, normal lids, iris . ENT:  grossly normal hearing, lips & tongue, mmm . Neck:  no LAD, masses or thyromegaly . Cardiovascular:  RRR, no m/r/g. No LE edema.  Marland Kitchen Respiratory:  CTA bilaterally, no w/r/r. Normal respiratory effort. . Abdomen:  soft, ntnd, NABS . Skin:  no rash or induration seen on limited exam . Musculoskeletal:  grossly normal tone BUE/BLE, good ROM, no bony abnormality . Psychiatric: grossly normal mood and affect, speech fluent and appropriate, AOx3 Neurologic: Awake confused.  Left upper and lower extremity weakness noted Labs on Admission: I  have personally reviewed following labs and imaging studies  CBC: Recent Labs  Lab 07/01/18 1531 07/01/18 1541  WBC 6.7  --   NEUTROABS 4.2  --   HGB 12.1 12.2  HCT 38.0 36.0  MCV 93.4  --   PLT 291  --    Basic Metabolic Panel: Recent Labs  Lab 07/01/18 1531 07/01/18 1541  NA 137 137  K 4.1 4.0  CL 98 100  CO2 26  --   GLUCOSE 118* 114*  BUN 25* 27*  CREATININE 1.18* 1.20*  CALCIUM 10.0  --    GFR: Estimated Creatinine Clearance: 32.1 mL/min (A) (by C-G formula based on SCr of 1.2 mg/dL (H)). Liver Function Tests: Recent Labs  Lab 07/01/18 1531  AST 18  ALT 12  ALKPHOS 77  BILITOT 0.5  PROT 6.5  ALBUMIN 3.6   No results for input(s): LIPASE, AMYLASE in the last 168 hours. No results for input(s): AMMONIA in the last 168 hours. Coagulation Profile: Recent Labs  Lab 07/01/18 1531  INR 0.92   Cardiac Enzymes: No results for input(s): CKTOTAL, CKMB, CKMBINDEX, TROPONINI in the last 168 hours. BNP (last 3 results) No results for input(s): PROBNP in the last 8760 hours. HbA1C: No results for input(s): HGBA1C in the last 72 hours. CBG: No results for input(s): GLUCAP in the last 168 hours. Lipid Profile: No results for input(s): CHOL, HDL, LDLCALC, TRIG, CHOLHDL, LDLDIRECT in the last 72 hours. Thyroid Function Tests: No results for input(s): TSH, T4TOTAL, FREET4, T3FREE, THYROIDAB in the last 72 hours. Anemia Panel: No results for input(s): VITAMINB12, FOLATE, FERRITIN,  TIBC, IRON, RETICCTPCT in the last 72 hours. Urine analysis: No results found for: COLORURINE, APPEARANCEUR, LABSPEC, PHURINE, GLUCOSEU, HGBUR, BILIRUBINUR, KETONESUR, PROTEINUR, UROBILINOGEN, NITRITE, LEUKOCYTESUR  Creatinine Clearance: Estimated Creatinine Clearance: 32.1 mL/min (A) (by C-G formula based on SCr of 1.2 mg/dL (H)).  Sepsis Labs: @LABRCNTIP (procalcitonin:4,lacticidven:4) )No results found for this or any previous visit (from the past 240 hour(s)).   Radiological Exams on Admission: Ct Head Wo Contrast  Addendum Date: 07/01/2018   ADDENDUM REPORT: 07/01/2018 16:28 ADDENDUM: Critical Value/emergent results were called by telephone at the time of interpretation on 07/01/2018 at 1624 hours to Dr. Fredia Sorrow , who verbally acknowledged these results. Electronically Signed   By: Genevie Ann M.D.   On: 07/01/2018 16:28   Result Date: 07/01/2018 CLINICAL DATA:  84 year old female status post polytrauma. Bilateral subdural hematomas since earlier this month. EXAM: CT HEAD WITHOUT CONTRAST CT CERVICAL SPINE WITHOUT CONTRAST TECHNIQUE: Multidetector CT imaging of the head and cervical spine was performed following the standard protocol without intravenous contrast. Multiplanar CT image reconstructions of the cervical spine were also generated. COMPARISON:  06/15/2018 head CT and earlier, including cervical spine CT 05/07/2018. FINDINGS: CT HEAD FINDINGS Brain: Progressed right side mostly hypodense extra-axial collection and associated mass effect on the brain. The collection is now biconvex measuring up to 27 millimeters in thickness, with hyperdensity along the margins. See coronal image 29). There is associated leftward midline shift of 7 millimeters, new since 06/15/2018. At the same time the contralateral left mixed density subdural hematoma has decreased to 5-7 millimeters in thickness along the anterior left convexity. No intra-axial or intraventricular blood. Mass effect on the right  lateral ventricle. Stable ventricle size otherwise. Basilar cisterns remain patent. No cortically based acute infarct identified. Vascular: Calcified atherosclerosis at the skull base. Skull: No skull fracture identified. Sinuses/Orbits: Small low-density fluid level in the left sphenoid sinus. Other Visualized paranasal  sinuses and mastoids are stable and well pneumatized. Other: No acute orbit or scalp soft tissue findings. CT CERVICAL SPINE FINDINGS Alignment: Stable mild straightening. Cervicothoracic junction alignment is within normal limits. Bilateral posterior element alignment is within normal limits. Skull base and vertebrae: Osteopenia. No acute cervical spine fracture. Soft tissues and spinal canal: No prevertebral fluid or swelling. No visible canal hematoma. Bulky left carotid calcified atherosclerosis again noted. Disc levels: Stable. Advanced mid and lower cervical disc and endplate degeneration. Upper chest: Left posterior 1st through 3rd rib fractures with some periosteal reaction. These are relatively nondisplaced. Negative visible lung apices. Negative noncontrast thoracic inlet. Visible upper thoracic vertebral bodies appear to remain intact. IMPRESSION: 1. Progressed right side extra-axial collection since the subdural hematoma on 06/15/2018. This is now biconvex, measuring up to 27 mm in thickness, and associated with increased brain mass effect including 7 mm of leftward midline shift. 2. Contralateral left subdural hematoma has regressed, now 5-7 mm thickness. 3. Subacute appearing left posterior 1st through 3rd rib fractures with periosteal reaction. 4. Osteopenia.  No skull or cervical spine fracture identified. Electronically Signed: By: Genevie Ann M.D. On: 07/01/2018 16:20   Ct Cervical Spine Wo Contrast  Addendum Date: 07/01/2018   ADDENDUM REPORT: 07/01/2018 16:28 ADDENDUM: Critical Value/emergent results were called by telephone at the time of interpretation on 07/01/2018 at 1624  hours to Dr. Fredia Sorrow , who verbally acknowledged these results. Electronically Signed   By: Genevie Ann M.D.   On: 07/01/2018 16:28   Result Date: 07/01/2018 CLINICAL DATA:  84 year old female status post polytrauma. Bilateral subdural hematomas since earlier this month. EXAM: CT HEAD WITHOUT CONTRAST CT CERVICAL SPINE WITHOUT CONTRAST TECHNIQUE: Multidetector CT imaging of the head and cervical spine was performed following the standard protocol without intravenous contrast. Multiplanar CT image reconstructions of the cervical spine were also generated. COMPARISON:  06/15/2018 head CT and earlier, including cervical spine CT 05/07/2018. FINDINGS: CT HEAD FINDINGS Brain: Progressed right side mostly hypodense extra-axial collection and associated mass effect on the brain. The collection is now biconvex measuring up to 27 millimeters in thickness, with hyperdensity along the margins. See coronal image 29). There is associated leftward midline shift of 7 millimeters, new since 06/15/2018. At the same time the contralateral left mixed density subdural hematoma has decreased to 5-7 millimeters in thickness along the anterior left convexity. No intra-axial or intraventricular blood. Mass effect on the right lateral ventricle. Stable ventricle size otherwise. Basilar cisterns remain patent. No cortically based acute infarct identified. Vascular: Calcified atherosclerosis at the skull base. Skull: No skull fracture identified. Sinuses/Orbits: Small low-density fluid level in the left sphenoid sinus. Other Visualized paranasal sinuses and mastoids are stable and well pneumatized. Other: No acute orbit or scalp soft tissue findings. CT CERVICAL SPINE FINDINGS Alignment: Stable mild straightening. Cervicothoracic junction alignment is within normal limits. Bilateral posterior element alignment is within normal limits. Skull base and vertebrae: Osteopenia. No acute cervical spine fracture. Soft tissues and spinal canal:  No prevertebral fluid or swelling. No visible canal hematoma. Bulky left carotid calcified atherosclerosis again noted. Disc levels: Stable. Advanced mid and lower cervical disc and endplate degeneration. Upper chest: Left posterior 1st through 3rd rib fractures with some periosteal reaction. These are relatively nondisplaced. Negative visible lung apices. Negative noncontrast thoracic inlet. Visible upper thoracic vertebral bodies appear to remain intact. IMPRESSION: 1. Progressed right side extra-axial collection since the subdural hematoma on 06/15/2018. This is now biconvex, measuring up to 27 mm in thickness, and  associated with increased brain mass effect including 7 mm of leftward midline shift. 2. Contralateral left subdural hematoma has regressed, now 5-7 mm thickness. 3. Subacute appearing left posterior 1st through 3rd rib fractures with periosteal reaction. 4. Osteopenia.  No skull or cervical spine fracture identified. Electronically Signed: By: Genevie Ann M.D. On: 07/01/2018 16:20     Assessment/Plan Active Problems:   Subdural hematoma ([place])    #1 subdural hematoma patient recently admitted to follow-up for and discharged with bilateral subdural hematoma now readmitted with expansion of her right subdural hematoma and possibly seizures.  Patient was seen by neurosurgery in the ER recommended to admit her to stepdown unit watch her overnight consult and to get an EEG.  Discussed with neurology on the phone will start patient on Keppra. Patient might need a follow-up CT later.  #2 AKI secondary to decreased p.o. intake slow IV hydration and recheck labs in the morning  #3 hypothyroidism continue Synthroid  #4 Alzheimer's dementia family reports that it is progressively getting worse she takes Aricept  #5 hyperlipidemia patient takes Lipitor restart once she is more stable  #6 type 2 diabetes on metformin which I will hold in place her on SSI sensitive scale  #7 hypertension continue  Norvasc.         Estimated body mass index is 29.44 kg/m as calculated from the following:   Height as of this encounter: 5\' 2"  (1.575 m).   Weight as of this encounter: 73 kg.   DVT prophylaxis: scd Code Status: full Family Communication: Discussed with patient's daughter and granddaughter  disposition Plan: Pending clinical improvement Consults called neurosurgery Neurology by phone Admission status: Inpatient   Georgette Shell MD [facility]  If 7PM-7AM, please contact night-coverage [email or web address] Password [email or web address]  07/01/2018, 6:02 PM

## 2018-07-01 NOTE — ED Notes (Signed)
Purewick external urinary catheter applied

## 2018-07-01 NOTE — Consult Note (Signed)
Neurosurgery Consultation  Reason for Consult: subdural hematoma Referring Physician: Rogene Houston  CC: weakness  HPI: This is a 84 y.o. woman that presents with left sided weakness after a fall. She was found down this morning at her SNF, where she resides due to Alzheimer's dementia. She was hospitalized 2 weeks ago for a fall, at which time she had bilateral SDHs. She returned to her SNF. Her daughter is present and states that she noticed the patient dragging her left side and was barely able to move it yesterday and the day before. In comparison with her strength today, her strength has improved. A new CTH was obtained since she was found down, which showed an expansion of the R SDH. She has been off of her ASA, no other anti-PLTs or anti-coagulants.  ROS: A 14 point ROS was performed and is negative except as noted in the HPI.   PMHx:  Past Medical History:  Diagnosis Date  . Alzheimer disease ([place])    FamHx: History reviewed. No pertinent family history. SocHx:  reports that she has never smoked. She has never used smokeless tobacco. She reports that she does not drink alcohol or use drugs.  Exam: Vital signs in last 24 hours: Temp:  [97.7 F (36.5 C)] 97.7 F (36.5 C) (12/19 1522) Pulse Rate:  [56-60] 58 (12/19 1645) Resp:  [13-16] 13 (12/19 1645) BP: (144-164)/(61-107) 147/83 (12/19 1645) SpO2:  [96 %-100 %] 98 % (12/19 1645) Weight:  [73 kg] 73 kg (12/19 1535) General: Awake, alert, cooperative, lying in bed in NAD, occasionally pulling at lines or monitoring wires Head: normocephalic, left orbital rim contusion does not appear acute HEENT: neck supple Pulmonary: breathing O2 via [place] comfortably, no evidence of increased work of breathing Cardiac: RRR Abdomen: S NT ND Extremities: warm and well perfused x4 Neuro: AOx2, daughter says she never knows the year Strength 5/5 on R, 4+/5 on L, SILTx4   Assessment and Plan: 84 y.o. woman with fall and SDH 2 weeks ago, now  returning with paroxysmal weakness of the left upper extremity. CT head personally reviewed, which shows expansion of the R SDH with hypodense products, creating 67mm of midline shift at the level of the septum.  -clinical history is most consistent with seizures 2/2 the subdural hematoma rather than mass effect causing weakness. Her weakness was almost hemiplegia 48h ago and now she is almost back to baseline. Given patient's age, dementia, and clinical evidence of frailty, my recommendation is to maximize non-surgical treatment of her weakness, given that it is likely electrical and not structural. -recommend admission to hospitalist service to obtain EEG to correlate any electrographic findings with her paroxysmal weakness and treatment as indicated -please call with any concerns or questions  Judith Part, MD 07/01/18 5:22 PM [facility]

## 2018-07-01 NOTE — Progress Notes (Signed)
Patient admit from the ED to room 13 with subdural hematoma. Patient is on room air alert but confused. Daughter at bedside. RN Put patient on tele, vital signs within normal range, patient is resting comfortably in her bed. Report received from [place], [place], will continue to monitor patient.

## 2018-07-01 NOTE — ED Triage Notes (Signed)
Per [facility], pt from [facility] w/ a c/o a possible stroke. LSN at 0700 this morning. It was reported that the pt fell from bed at 0400 this morning w/o injury. Unknown LOC. She was assisted back into bed by staff who reported that she was able to stand on her own with assistance. Weakness to her left side was noted at ~ 0700 this AM. She was dragging her left foot. EMS reported weakness with left hand grip but no facial droop or arm drift. No blood thinners. Complaints of a mild headache earlier. Family reports that the pt has had weakness since Tuesday of this past week, and that her left sided weakness has been improving since noted onset. Recent hx of a subdural head bleed in Oct 2019. AO per her norm.   140/70s HR 58 96% RA CBG 122 T 98.9

## 2018-07-01 NOTE — ED Provider Notes (Signed)
[place] EMERGENCY DEPARTMENT Provider Note   CSN: [id number] Arrival date & time: 07/01/18  1513     History   Chief Complaint Chief Complaint  Patient presents with  . Cerebrovascular Accident  . Weakness    HPI Bianca Wilson is a 84 y.o. female.  Patient has a history of dementia.  Patient brought in by rocking him [place] from [place] with possible stroke.  [name] daughter tells me that she was last seen normal around 7 PM last night.  This morning she had a fall they noted by 7 in the morning she was kind of dragging her left leg.  Patient was recently admitted for subdural hematoma on December 3.  Upon arrival here patient may be still has some left-sided weakness but does have movement of left arm and left leg.  Is alert.  Old wound and bruising to left forehead area.     Past Medical History:  Diagnosis Date  . Alzheimer disease [facility])     Patient Active Problem List   Diagnosis Date Noted  . HTN (hypertension) 06/16/2018  . AKI (acute kidney injury) ([place]) 06/16/2018  . Alzheimer's dementia ([place]) 06/16/2018  . At risk for falls 06/16/2018  . Depression 06/16/2018  . HLD (hyperlipidemia) 06/16/2018  . Type 2 diabetes mellitus ([place]) 06/16/2018  . Hypothyroidism 06/16/2018  . Subdural hematoma ([place]) 06/15/2018    History reviewed. No pertinent surgical history.   OB History   No obstetric history on file.      Home Medications    Prior to Admission medications   Medication Sig Start Date End Date Taking? Authorizing Provider  alendronate (FOSAMAX) 70 MG tablet Take 70 mg by mouth every Sunday. Take with a full glass of water on an empty stomach.   Yes [provider]  amLODipine (NORVASC) 10 MG tablet Take 10 mg by mouth daily. For HTN hold for systolic less than 962   Yes [provider]  atorvastatin (LIPITOR) 20 MG tablet Take 20 mg by mouth daily.   Yes [provider]  Biotin 1000  MCG tablet Take 1,000 mcg by mouth every morning.   Yes [provider]  Calcium Carb-Cholecalciferol (CALCIUM 500 +D) 500-400 MG-UNIT TABS Take 1 tablet by mouth 2 (two) times daily.   Yes [provider]  chlorthalidone (HYGROTON) 25 MG tablet Take 25 mg by mouth daily.   Yes [provider]  cholestyramine (QUESTRAN) 4 g packet Take 4 g by mouth daily.   Yes [provider]  CRANBERRY PO Take 1 capsule by mouth daily.   Yes [provider]  Cyanocobalamin (VITAMIN B12 PO) Take 1 tablet by mouth daily.   Yes [provider]  donepezil (ARICEPT) 10 MG tablet Take 10 mg by mouth at bedtime.   Yes [provider]  escitalopram (LEXAPRO) 20 MG tablet Take 20 mg by mouth daily.   Yes [provider]  Lactobacillus (ACIDOPHILUS PO) Take 1 capsule by mouth daily.   Yes [provider]  levothyroxine (SYNTHROID, LEVOTHROID) 25 MCG tablet Take 25 mcg by mouth daily before breakfast.   Yes [provider]  loperamide (IMODIUM A-D) 2 MG tablet Take 2 mg by mouth as needed for diarrhea or loose stools.   Yes [provider]  memantine (NAMENDA) 10 MG tablet Take 10 mg by mouth 2 (two) times daily.   Yes [provider]  metFORMIN (GLUCOPHAGE) 500 MG tablet Take 500 mg by mouth  2 (two) times daily with a meal.   Yes [provider]  trimethoprim (TRIMPEX) 100 MG tablet Take 100 mg by mouth daily.   Yes [provider]  vitamin C (ASCORBIC ACID) 500 MG tablet Take 500 mg by mouth daily.   Yes [provider]    Family History History reviewed. No pertinent family history.  Social History Social History   Tobacco Use  . Smoking status: Never Smoker  . Smokeless tobacco: Never Used  Substance Use Topics  . Alcohol use: Never    Frequency: Never  . Drug use: Never     Allergies   Diphenhydramine-acetaminophen and Other   Review of Systems Review of Systems    Unable to perform ROS: Dementia     Physical Exam Updated Vital Signs BP 135/79   Pulse 61   Temp 97.7 F (36.5 C) (Oral)   Resp 12   Ht 1.575 m (5\' 2" )   Wt 73 kg   SpO2 99%   BMI 29.44 kg/m   Physical Exam Vitals signs and nursing note reviewed.  Constitutional:      General: She is not in acute distress.    Appearance: She is not toxic-appearing.  HENT:     Head: Normocephalic.     Mouth/Throat:     Mouth: Mucous membranes are moist.  Eyes:     Extraocular Movements: Extraocular movements intact.     Conjunctiva/sclera: Conjunctivae normal.     Pupils: Pupils are equal, round, and reactive to light.  Neck:     Musculoskeletal: Neck supple.  Cardiovascular:     Rate and Rhythm: Normal rate and regular rhythm.  Pulmonary:     Effort: Pulmonary effort is normal.     Breath sounds: Normal breath sounds.  Abdominal:     General: Bowel sounds are normal.     Palpations: Abdomen is soft.     Tenderness: There is no abdominal tenderness.  Skin:    General: Skin is warm.  Neurological:     Mental Status: She is alert. Mental status is at baseline.     Motor: Weakness present.     Comments: Slight weakness to the left side.      ED Treatments / Results  Labs (all labs ordered are listed, but only abnormal results are displayed) Labs Reviewed  COMPREHENSIVE METABOLIC PANEL - Abnormal; Notable for the following components:      Result Value   Glucose, Bld 118 (*)    BUN 25 (*)    Creatinine, Ser 1.18 (*)    GFR calc non Af Amer 42 (*)    GFR calc Af Amer 49 (*)    All other components within normal limits  I-STAT CHEM 8, ED - Abnormal; Notable for the following components:   BUN 27 (*)    Creatinine, Ser 1.20 (*)    Glucose, Bld 114 (*)    All other components within normal limits  ETHANOL  PROTIME-INR  APTT  CBC  DIFFERENTIAL  RAPID URINE DRUG SCREEN, HOSP PERFORMED  URINALYSIS, ROUTINE W REFLEX MICROSCOPIC  I-STAT TROPONIN, ED    EKG EKG  Interpretation  Date/Time:  Thursday July 01 2018 15:18:54 EST Ventricular Rate:  59 PR Interval:    QRS Duration: 91 QT Interval:  443 QTC Calculation: 439 R Axis:   73 Text Interpretation:  Sinus rhythm Ventricular premature complex Low voltage, precordial leads Nonspecific T abnormalities, anterior leads No previous ECGs available Confirmed by Fredia Sorrow [phone]) on 07/01/2018  4:03:16 PM   Radiology Ct Head Wo Contrast  Addendum Date: 07/01/2018   ADDENDUM REPORT: 07/01/2018 16:28 ADDENDUM: Critical Value/emergent results were called by telephone at the time of interpretation on 07/01/2018 at 1624 hours to Dr. Fredia Sorrow , who verbally acknowledged these results. Electronically Signed   By: Genevie Ann M.D.   On: 07/01/2018 16:28   Result Date: 07/01/2018 CLINICAL DATA:  84 year old female status post polytrauma. Bilateral subdural hematomas since earlier this month. EXAM: CT HEAD WITHOUT CONTRAST CT CERVICAL SPINE WITHOUT CONTRAST TECHNIQUE: Multidetector CT imaging of the head and cervical spine was performed following the standard protocol without intravenous contrast. Multiplanar CT image reconstructions of the cervical spine were also generated. COMPARISON:  06/15/2018 head CT and earlier, including cervical spine CT 05/07/2018. FINDINGS: CT HEAD FINDINGS Brain: Progressed right side mostly hypodense extra-axial collection and associated mass effect on the brain. The collection is now biconvex measuring up to 27 millimeters in thickness, with hyperdensity along the margins. See coronal image 29). There is associated leftward midline shift of 7 millimeters, new since 06/15/2018. At the same time the contralateral left mixed density subdural hematoma has decreased to 5-7 millimeters in thickness along the anterior left convexity. No intra-axial or intraventricular blood. Mass effect on the right lateral ventricle. Stable ventricle size otherwise. Basilar cisterns remain patent. No  cortically based acute infarct identified. Vascular: Calcified atherosclerosis at the skull base. Skull: No skull fracture identified. Sinuses/Orbits: Small low-density fluid level in the left sphenoid sinus. Other Visualized paranasal sinuses and mastoids are stable and well pneumatized. Other: No acute orbit or scalp soft tissue findings. CT CERVICAL SPINE FINDINGS Alignment: Stable mild straightening. Cervicothoracic junction alignment is within normal limits. Bilateral posterior element alignment is within normal limits. Skull base and vertebrae: Osteopenia. No acute cervical spine fracture. Soft tissues and spinal canal: No prevertebral fluid or swelling. No visible canal hematoma. Bulky left carotid calcified atherosclerosis again noted. Disc levels: Stable. Advanced mid and lower cervical disc and endplate degeneration. Upper chest: Left posterior 1st through 3rd rib fractures with some periosteal reaction. These are relatively nondisplaced. Negative visible lung apices. Negative noncontrast thoracic inlet. Visible upper thoracic vertebral bodies appear to remain intact. IMPRESSION: 1. Progressed right side extra-axial collection since the subdural hematoma on 06/15/2018. This is now biconvex, measuring up to 27 mm in thickness, and associated with increased brain mass effect including 7 mm of leftward midline shift. 2. Contralateral left subdural hematoma has regressed, now 5-7 mm thickness. 3. Subacute appearing left posterior 1st through 3rd rib fractures with periosteal reaction. 4. Osteopenia.  No skull or cervical spine fracture identified. Electronically Signed: By: Genevie Ann M.D. On: 07/01/2018 16:20   Ct Cervical Spine Wo Contrast  Addendum Date: 07/01/2018   ADDENDUM REPORT: 07/01/2018 16:28 ADDENDUM: Critical Value/emergent results were called by telephone at the time of interpretation on 07/01/2018 at 1624 hours to Dr. Fredia Sorrow , who verbally acknowledged these results. Electronically  Signed   By: Genevie Ann M.D.   On: 07/01/2018 16:28   Result Date: 07/01/2018 CLINICAL DATA:  84 year old female status post polytrauma. Bilateral subdural hematomas since earlier this month. EXAM: CT HEAD WITHOUT CONTRAST CT CERVICAL SPINE WITHOUT CONTRAST TECHNIQUE: Multidetector CT imaging of the head and cervical spine was performed following the standard protocol without intravenous contrast. Multiplanar CT image reconstructions of the cervical spine were also generated. COMPARISON:  06/15/2018 head CT and earlier, including cervical spine CT 05/07/2018. FINDINGS: CT HEAD FINDINGS Brain: Progressed right side mostly  hypodense extra-axial collection and associated mass effect on the brain. The collection is now biconvex measuring up to 27 millimeters in thickness, with hyperdensity along the margins. See coronal image 29). There is associated leftward midline shift of 7 millimeters, new since 06/15/2018. At the same time the contralateral left mixed density subdural hematoma has decreased to 5-7 millimeters in thickness along the anterior left convexity. No intra-axial or intraventricular blood. Mass effect on the right lateral ventricle. Stable ventricle size otherwise. Basilar cisterns remain patent. No cortically based acute infarct identified. Vascular: Calcified atherosclerosis at the skull base. Skull: No skull fracture identified. Sinuses/Orbits: Small low-density fluid level in the left sphenoid sinus. Other Visualized paranasal sinuses and mastoids are stable and well pneumatized. Other: No acute orbit or scalp soft tissue findings. CT CERVICAL SPINE FINDINGS Alignment: Stable mild straightening. Cervicothoracic junction alignment is within normal limits. Bilateral posterior element alignment is within normal limits. Skull base and vertebrae: Osteopenia. No acute cervical spine fracture. Soft tissues and spinal canal: No prevertebral fluid or swelling. No visible canal hematoma. Bulky left carotid  calcified atherosclerosis again noted. Disc levels: Stable. Advanced mid and lower cervical disc and endplate degeneration. Upper chest: Left posterior 1st through 3rd rib fractures with some periosteal reaction. These are relatively nondisplaced. Negative visible lung apices. Negative noncontrast thoracic inlet. Visible upper thoracic vertebral bodies appear to remain intact. IMPRESSION: 1. Progressed right side extra-axial collection since the subdural hematoma on 06/15/2018. This is now biconvex, measuring up to 27 mm in thickness, and associated with increased brain mass effect including 7 mm of leftward midline shift. 2. Contralateral left subdural hematoma has regressed, now 5-7 mm thickness. 3. Subacute appearing left posterior 1st through 3rd rib fractures with periosteal reaction. 4. Osteopenia.  No skull or cervical spine fracture identified. Electronically Signed: By: Genevie Ann M.D. On: 07/01/2018 16:20    Procedures Procedures (including critical care time)  CRITICAL CARE Performed by: Fredia Sorrow Total critical care time: 30 minutes Critical care time was exclusive of separately billable procedures and treating other patients. Critical care was necessary to treat or prevent imminent or life-threatening deterioration. Critical care was time spent personally by me on the following activities: development of treatment plan with patient and/or surrogate as well as nursing, discussions with consultants, evaluation of patient's response to treatment, examination of patient, obtaining history from patient or surrogate, ordering and performing treatments and interventions, ordering and review of laboratory studies, ordering and review of radiographic studies, pulse oximetry and re-evaluation of patient's condition.   Medications Ordered in ED Medications  amLODipine (NORVASC) tablet 10 mg (has no administration in time range)  levothyroxine (SYNTHROID, LEVOTHROID) tablet 25 mcg (has no  administration in time range)  insulin aspart (novoLOG) injection 0-9 Units (has no administration in time range)     Initial Impression / Assessment and Plan / ED Course  I have reviewed the triage vital signs and the nursing notes.  Pertinent labs & imaging results that were available during my care of the patient were reviewed by me and considered in my medical decision making (see chart for details).     Patient presenting as possible stroke.  Initially EMS reports she was last seen normal at 7 in the morning.  It appears according to family that was 7 in the evening.  Patient without any band symptoms.  Patient seems to have weakness on the left side but does have movement of both arm and leg.  CT scan shows significant worsening of the  right-sided subdural hematoma that was present on December 3.  There is a left shift. patient alert.  Consulted neurosurgery they saw the patient.  They feel due to her history of dementia and her age that they going to observe her.  They wanted hospitalist to admit her to stepdown unit.  Hospitalist contacted and they will admit.  Final Clinical Impressions(s) / ED Diagnoses   Final diagnoses:  Subdural hematoma [facility])    ED Discharge Orders    None       Fredia Sorrow, MD 07/01/18 [phone]

## 2018-07-01 NOTE — ED Notes (Signed)
I-Stat from mini lab sent to Main Lab for ETOH.

## 2018-07-01 NOTE — ED Notes (Signed)
Patient transported to CT 

## 2018-07-02 ENCOUNTER — Inpatient Hospital Stay (HOSPITAL_COMMUNITY): Payer: MEDICARE

## 2018-07-02 DIAGNOSIS — Z9181 History of falling: Secondary | ICD-10-CM

## 2018-07-02 DIAGNOSIS — N179 Acute kidney failure, unspecified: Secondary | ICD-10-CM

## 2018-07-02 DIAGNOSIS — Z7189 Other specified counseling: Secondary | ICD-10-CM

## 2018-07-02 DIAGNOSIS — Z515 Encounter for palliative care: Secondary | ICD-10-CM

## 2018-07-02 DIAGNOSIS — S065X9A Traumatic subdural hemorrhage with loss of consciousness of unspecified duration, initial encounter: Principal | ICD-10-CM

## 2018-07-02 LAB — BASIC METABOLIC PANEL
Anion gap: 12 (ref 5–15)
BUN: 20 mg/dL (ref 8–23)
CO2: 27 mmol/L (ref 22–32)
Calcium: 9.6 mg/dL (ref 8.9–10.3)
Chloride: 101 mmol/L (ref 98–111)
Creatinine, Ser: 1.22 mg/dL — ABNORMAL HIGH (ref 0.44–1.00)
GFR calc Af Amer: 47 mL/min — ABNORMAL LOW (ref >60)
GFR calc non Af Amer: 40 mL/min — ABNORMAL LOW (ref >60)
Glucose, Bld: 114 mg/dL — ABNORMAL HIGH (ref 70–99)
Potassium: 4 mmol/L (ref 3.5–5.1)
SODIUM: 140 mmol/L (ref 135–145)

## 2018-07-02 LAB — GLUCOSE, CAPILLARY
GLUCOSE-CAPILLARY: 129 mg/dL — AB (ref 70–99)
Glucose-Capillary: 127 mg/dL — ABNORMAL HIGH (ref 70–99)
Glucose-Capillary: 93 mg/dL (ref 70–99)
Glucose-Capillary: 94 mg/dL (ref 70–99)
Glucose-Capillary: 123 mg/dL — ABNORMAL HIGH (ref 70–99)

## 2018-07-02 MED ORDER — DEXAMETHASONE SODIUM PHOSPHATE 10 MG/ML IJ SOLN
4.0000 mg | INTRAMUSCULAR | Status: AC
  Administered 2018-07-02 – 2018-07-03 (×3): 4 mg via INTRAVENOUS
  Filled 2018-07-02 (×3): qty 1

## 2018-07-02 MED ORDER — PNEUMOCOCCAL VAC POLYVALENT 25 MCG/0.5ML IJ INJ
0.5000 mL | INJECTION | INTRAMUSCULAR | Status: DC
  Filled 2018-07-02 (×2): qty 0.5

## 2018-07-02 MED ORDER — LEVOTHYROXINE SODIUM 100 MCG/5ML IV SOLN
12.5000 ug | Freq: Every day | INTRAVENOUS | Status: AC
  Administered 2018-07-02 – 2018-07-03 (×2): 12.5 ug via INTRAVENOUS
  Administered 2018-07-04: 100 ug via INTRAVENOUS
  Filled 2018-07-02 (×3): qty 5

## 2018-07-02 NOTE — Evaluation (Addendum)
Physical Therapy Evaluation Patient Details Name: Bianca Wilson MRN: [id number] DOB: June 11, 1931 Today's Date: 07/02/2018   History of Present Illness  Pt is an 84 y/o female admitted from SNF secondary to sustaining a fall. Of note, pt was hospitalized 2 weeks ago for a fall, at which time she had bilateral SDHs.  A new CTH was obtained since she was found down, which showed an expansion of the R SDH. PMH including but not limited to Alzheimer's.    Clinical Impression  Pt presented supine in bed with HOB elevated, asleep and remained lethargic throughout. Pt's daughter present throughout and provided all history information. Prior to admission, pt ambulated with RW and standby assist and required assistance with ADLs. Pt currently very limited secondary to lethargy and weakness. Pt requires max A x2 for bed mobility and progressed from requiring max A to close min guard to sit EOB. Pt would continue to benefit from skilled physical therapy services at this time while admitted and after d/c to address the below listed limitations in order to improve overall safety and independence with functional mobility.     Follow Up Recommendations SNF    Equipment Recommendations  None recommended by PT    Recommendations for Other Services       Precautions / Restrictions Precautions Precautions: Fall Restrictions Weight Bearing Restrictions: No      Mobility  Bed Mobility Overal bed mobility: Needs Assistance Bed Mobility: Supine to Sit;Sit to Supine     Supine to sit: Max assist;+2 for physical assistance Sit to supine: Max assist;+2 for physical assistance   General bed mobility comments: increased time and effort, multimodal cueing required, use of bed rails with R UE, heavy assist for trunk elevation and for bilateral LE management  Transfers                 General transfer comment: deferred secondary to lethargy and fatigue  Ambulation/Gait                 Stairs            Wheelchair Mobility    Modified Rankin (Stroke Patients Only)       Balance Overall balance assessment: Needs assistance Sitting-balance support: Feet supported;No upper extremity supported;Single extremity supported;Bilateral upper extremity supported Sitting balance-Leahy Scale: Poor Sitting balance - Comments: progressing from needing max A to close min guard Postural control: Posterior lean                                   Pertinent Vitals/Pain Pain Assessment: No/denies pain    Home Living Family/patient expects to be discharged to:: Skilled nursing facility                      Prior Function Level of Independence: Needs assistance   Gait / Transfers Assistance Needed: ambulates with RW with assistance  ADL's / Homemaking Assistance Needed: requires assistance with bathing/dressing        Hand Dominance        Extremity/Trunk Assessment   Upper Extremity Assessment Upper Extremity Assessment: Generalized weakness    Lower Extremity Assessment Lower Extremity Assessment: Generalized weakness    Cervical / Trunk Assessment Cervical / Trunk Assessment: Kyphotic  Communication   Communication: No difficulties  Cognition Arousal/Alertness: Lethargic Behavior During Therapy: Flat affect Overall Cognitive Status: History of cognitive impairments - at baseline  General Comments      Exercises     Assessment/Plan    PT Assessment Patient needs continued PT services  PT Problem List Decreased strength;Decreased range of motion;Decreased activity tolerance;Decreased balance;Decreased mobility;Decreased coordination;Decreased cognition;Decreased knowledge of use of DME;Decreased safety awareness;Decreased knowledge of precautions       PT Treatment Interventions DME instruction;Gait training;Stair training;Functional mobility training;Therapeutic  activities;Therapeutic exercise;Balance training;Neuromuscular re-education;Cognitive remediation;Patient/family education    PT Goals (Current goals can be found in the Care Plan section)  Acute Rehab PT Goals Patient Stated Goal: pt unable to state secondary to lethargy PT Goal Formulation: With family Time For Goal Achievement: 07/16/18 Potential to Achieve Goals: Good    Frequency Min 2X/week   Barriers to discharge        Co-evaluation               AM-PAC PT "6 Clicks" Mobility  Outcome Measure Help needed turning from your back to your side while in a flat bed without using bedrails?: A Lot Help needed moving from lying on your back to sitting on the side of a flat bed without using bedrails?: A Lot Help needed moving to and from a bed to a chair (including a wheelchair)?: Total Help needed standing up from a chair using your arms (e.g., wheelchair or bedside chair)?: Total Help needed to walk in hospital room?: Total Help needed climbing 3-5 steps with a railing? : Total 6 Click Score: 8    End of Session   Activity Tolerance: Patient limited by fatigue;Patient limited by lethargy Patient left: in bed;with call bell/phone within reach;with bed alarm set;with family/visitor present Nurse Communication: Mobility status PT Visit Diagnosis: Other abnormalities of gait and mobility (R26.89);Other symptoms and signs involving the nervous system (R29.898)    Time: 1415-1436 PT Time Calculation (min) (ACUTE ONLY): 21 min   Charges:   PT Evaluation $PT Eval Moderate Complexity: 1 Mod PT Treatments $Therapeutic Activity: 8-22 mins        Sherie Don, PT, DPT  Acute Rehabilitation Services Pager [phone] Office [place] 07/02/2018, 3:37 PM

## 2018-07-02 NOTE — Consult Note (Signed)
Consultation Note Date: 07/02/2018   Patient Name: Bianca Wilson  DOB: 1930/10/25  MRN: [id number]  Age / Sex: 84 y.o., female  PCP: Raelyn Number, MD Referring Physician: Mendel Corning, MD  Reason for Consultation: Establishing goals of care  HPI/Patient Profile: 84 y.o. female  with past medical history of hypothyroidism, HLD, T2DM, and dementia admitted on 07/01/2018 with left sided weakness after a fall. Of note, pt was hospitalized two weeks ago with a fall and bilateral subdural hematomas. Apparently, family declined surgery at that time. Patient has been living at [facility]. Head CT revealed progression of R subdural hematoma compared to CT on 12/3. Per neurosurgery note, recommendation to maximize non-surgical treatment d/t her age, dementia, and frailty. PMT consulted for [place].   Clinical Assessment and Goals of Care: I have reviewed medical records including EPIC notes, labs and imaging, received report from RN, assessed the patient and then met with patient's daughter, [name], and patient's great grandson  to discuss diagnosis prognosis, GOC, EOL wishes, disposition and options.  I introduced Palliative Medicine as specialized medical care for people living with serious illness. It focuses on providing relief from the symptoms and stress of a serious illness. The goal is to improve quality of life for both the patient and the family.  As far as functional and nutritional status, patient walked with walker and needed assistance with all ADLs. Daughter tells me she talked "some". Tells me that patient always recognized her, but was often confused. Good appetite.    We discussed her current illness and what it means in the larger context of her on-going co-morbidities.  Daughter tells me she is frustrated - tells me she feels like she has heard too many different medical opinions and is not sure what to do. We discussed that patient's  chart reflects a recommendation for non-surgical treatment. She tells me she heard that if patient received surgical intervention for hematoma that it is a minor procedure and she would be "fine". She tells me "if someone could tell me with 409% certainty that she would be fine then I would let them do the procedure". We discussed that no one can be 735% certain about outcome of procedures. We discussed considering her quality of life while making these decisions.   I attempted to elicit values and goals of care important to the patient.  I asked her what her mother would say if she were sitting with [place] talking about the situation. She tells me she would say "talk to neurosurgery again".  The difference between aggressive medical intervention and comfort care was considered in light of the patient's goals of care. We discussed what an aggressive medical path could potentially include and then we discussed what a comfort care path would look like. She then brings up hospice on her own and tells me "I should just take her home with hospice".  Advance directives, concepts specific to code status, artifical feeding and hydration, and rehospitalization were considered and discussed. When I bring up code status she starts crying and tells me "I can't talk about that". I let her know that the patient would remain a full code and we discussed what that means. She asks me about mechanical ventilation and before I can respond she tells me "I'm done, I can't talk about it".   We ended the meeting by encouraging her to think about the options and thinking about honoring her mother's wishes as her surrogate decision maker. She is hopeful to  talk with her daughter - patient's granddaughter - to receive guidance in decision making. I let her know someone with PMT would follow up with her tomorrow.  She tells me patient's son is coming from prison this evening to visit patient. She expresses grief about the patient's  relationship with him. Provided emotional support.  At the end of the visit, [name] expresses gratitude for our discussion and is agreeable to follow up tomorrow.  Questions and concerns were addressed. The family was encouraged to call with questions or concerns.   Primary Decision Maker NEXT OF KIN - daughter- [name]  SUMMARY OF RECOMMENDATIONS   - Do not think we can sort out goals of care until patient's daughter hears opinion from neurosurgery again - Discussed with Dr. Tana Coast who plans to reconsult neurosurgery - [name] daughter unable to discuss code status, she is aware patient remains full code for now - PMT will follow up tomorrow after neurosurgery provides recommendations  Code Status/Advance Care Planning:  Full code   Symptom Management:   Per primary  Prognosis:   Unable to determine  Discharge Planning: To Be Determined      Primary Diagnoses: Present on Admission: . Subdural hematoma ([place]) . HTN (hypertension) . Hypothyroidism . Alzheimer's dementia ([place]) . AKI (acute kidney injury) ([place])   I have reviewed the medical record, interviewed the patient and family, and examined the patient. The following aspects are pertinent.  Past Medical History:  Diagnosis Date  . Alzheimer disease [facility])    Social History   Socioeconomic History  . Marital status: Married    Spouse name: Not on file  . Number of children: Not on file  . Years of education: Not on file  . Highest education level: Not on file  Occupational History  . Not on file  Social Needs  . Financial resource strain: Not on file  . Food insecurity:    Worry: Not on file    Inability: Not on file  . Transportation needs:    Medical: Not on file    Non-medical: Not on file  Tobacco Use  . Smoking status: Never Smoker  . Smokeless tobacco: Never Used  Substance and Sexual Activity  . Alcohol use: Never    Frequency: Never  . Drug use: Never  . Sexual activity: Not on  file  Lifestyle  . Physical activity:    Days per week: Not on file    Minutes per session: Not on file  . Stress: Not on file  Relationships  . Social connections:    Talks on phone: Not on file    Gets together: Not on file    Attends religious service: Not on file    Active member of club or organization: Not on file    Attends meetings of clubs or organizations: Not on file    Relationship status: Not on file  Other Topics Concern  . Not on file  Social History Narrative  . Not on file   History reviewed. No pertinent family history. Scheduled Meds: . amLODipine  10 mg Oral Daily  . insulin aspart  0-9 Units Subcutaneous TID WC  . levothyroxine  12.5 mcg Intravenous Daily  . [START ON 07/03/2018] pneumococcal 23 valent vaccine  0.5 mL Intramuscular Tomorrow-1000   Continuous Infusions: . sodium chloride 50 mL/hr at 07/02/18 1355  . levETIRAcetam 500 mg (07/02/18 0907)   PRN Meds:. Allergies  Allergen Reactions  . Diphenhydramine-Acetaminophen     Other reaction(s): Delusions (intolerance)  .  Other Other (See Comments)    Muscles Relaxers ,Narcotics    Review of Systems  Unable to perform ROS: Mental status change    Physical Exam Constitutional:      General: She is not in acute distress.    Appearance: She is normal weight.  Cardiovascular:     Rate and Rhythm: Normal rate and regular rhythm.  Pulmonary:     Effort: Pulmonary effort is normal.     Breath sounds: Normal breath sounds.  Abdominal:     Palpations: Abdomen is soft.  Musculoskeletal:     Right lower leg: No edema.     Left lower leg: No edema.  Skin:    General: Skin is warm and dry.  Neurological:     Mental Status: She is unresponsive.     Vital Signs: BP (!) 154/52 (BP Location: Left Arm)   Pulse (!) 57   Temp 98.7 F (37.1 C) (Oral)   Resp 18   Ht 5' 2" (1.575 m)   Wt 73 kg   SpO2 97%   BMI 29.44 kg/m  Pain Scale: Faces   Pain Score: 0-No pain   SpO2: SpO2: 97 % O2  Device:SpO2: 97 % O2 Flow Rate: .   IO: Intake/output summary:   Intake/Output Summary (Last 24 hours) at 07/02/2018 1613 Last data filed at 07/02/2018 1300 Gross per 24 hour  Intake 714.97 ml  Output 1200 ml  Net -485.03 ml    LBM: Last BM Date: (PTA) Baseline Weight: Weight: 73 kg Most recent weight: Weight: 73 kg     Palliative Assessment/Data: PPS 10% -  No PO intake     Time Total: 70 minutes Greater than 50%  of this time was spent counseling and coordinating care related to the above assessment and plan.  Juel Burrow, DNP, AGNP-C Palliative Medicine Team [phone] Pager: [phone]

## 2018-07-02 NOTE — Procedures (Signed)
EEG Report  Clinical History:  84 yo with history of Alzheimer's dementia and bilateral SDH a month ago presented with fall and left leg weakness.    Technical Summary:  A 19 channel digital EEG recording was performed using the 10-20 international system of electrode placement.  Bipolar and Referential montages were used.  The total recording time was approx 20 minutes.  Findings:  There is no posterior dominant rhythm.  Background frequencies are about 5-6 Hz and symmetrical.   No focal slowing is present.  There are no epileptiform discharges or electrographic seizures present.  Sleep was not recorded.  Impression:  This is an abnormal EEG.  There is moderate generalized slowing of brain activity without any evidence of non-convulsive status epilepticus.  This is non-specific, but may related to underlying dementia or toxic, metabolic, infectious etiologies.  Clinical correlation is recommended.    Rogue Jury, MS, MD

## 2018-07-02 NOTE — Progress Notes (Signed)
PT Cancellation Note  Patient Details Name: Bianca Wilson MRN: [id number] DOB: 1930-10-11   Cancelled Treatment:    Reason Eval/Treat Not Completed: Patient at procedure or test/unavailable. Pt off of the floor for CT. PT will continue to follow-up with pt acutely.   [place] 07/02/2018, 1:39 PM

## 2018-07-02 NOTE — Progress Notes (Signed)
SLP Cancellation Note  Patient Details Name: Safaa Stingley MRN: [id number] DOB: [birth year]   Cancelled treatment:       Reason Eval/Treat Not Completed: Fatigue/lethargy limiting ability to participate; pt not arousable to stimulation.  Asked RN to text page SLP this afternoon if MS improves.  Otherwise, will continue efforts for swallow assessment next date.   [name] 07/02/2018, 9:47 AM

## 2018-07-02 NOTE — Consult Note (Addendum)
Requesting Physician: Dr. Tana Coast    Chief Complaint: Lethargy  History obtained from: Patient and Chart     HPI:                                                                                                                                       Bianca Wilson is an 84 y.o. female with past medical history of Alzheimer's dementia living in a nursing home with history of subdural hemorrhage 2 weeks ago presents to the emergency room yesterday after noted to be in her left side for 2 days.  CT head was performed which showed expansion of the right subdural hemorrhage with midline shift.  Patient is not on any antiplatelets.  Was started on Keppra 500 mg twice a day.  Today the patient was very lethargic on assessment and neurology was consulted for further evaluation for possible seizures.  She underwent a EEG did not show any significant epileptiform discharges.  [name] daughter and neurosurgery to make decision regarding operative approach for subdural hemorrhage      Past Medical History:  Diagnosis Date  . Alzheimer disease ([place])    FH: No family history of seizures SH: does not smoke,drink alcohol excessively    Allergies:  Allergies  Allergen Reactions  . Diphenhydramine-Acetaminophen     Other reaction(s): Delusions (intolerance)  . Other Other (See Comments)    Muscles Relaxers ,Narcotics     Medications:                                                                                                                        I reviewed home medications   ROS:  14 systems reviewed and negative except above    Examination:                                                                                                      General: Appears well-developed and well-nourished.  Psych: Affect appropriate to situation Eyes: No scleral  injection HENT: No OP obstrucion Head: Normocephalic.  Cardiovascular: Normal rate and regular rhythm.  Respiratory: Effort normal and breath sounds normal to anterior ascultation GI: Soft.  No distension. There is no tenderness.  Skin: WDI    Neurological Examination Mental Status: Drowsy, not oriented, does not answer any.  Speaks in sentences. able to follow simple  commands without difficulty. Cranial Nerves: II: Visual fields: blinks to threat bilaterally III,IV, VI: ptosis not present, extra-ocular motions intact bilaterally, pupils equal, round, reactive to light and accommodation V,VII: smile symmetric, VIII: hearing normal bilaterally IX,X: uvula rises symmetrically XI: bilateral shoulder shrug XII: midline tongue extension Motor: Right : Upper extremity   5/5    Left:     Upper extremity   4/5  Lower extremity   5/5     Lower extremity   4/5 Tone and bulk:normal tone throughout; no atrophy noted Sensory: Pinprick and light touch intact throughout, bilaterally Deep Tendon Reflexes: 1+ and symmetric throughout Plantars: Right: downgoing   Left: downgoing Cerebellar: No gross ataxa on exam  Gait: not assessed     Lab Results: Basic Metabolic Panel: Recent Labs  Lab 07/01/18 1531 07/01/18 1541 07/02/18 0412  NA 137 137 140  K 4.1 4.0 4.0  CL 98 100 101  CO2 26  --  27  GLUCOSE 118* 114* 114*  BUN 25* 27* 20  CREATININE 1.18* 1.20* 1.22*  CALCIUM 10.0  --  9.6    CBC: Recent Labs  Lab 07/01/18 1531 07/01/18 1541  WBC 6.7  --   NEUTROABS 4.2  --   HGB 12.1 12.2  HCT 38.0 36.0  MCV 93.4  --   PLT 291  --     Coagulation Studies: Recent Labs    07/01/18 1531  LABPROT 12.3  INR 0.92    Imaging: Ct Head Wo Contrast  Result Date: 07/02/2018 CLINICAL DATA:  Subdural hematomas. Increased lethargy. EXAM: CT HEAD WITHOUT CONTRAST TECHNIQUE: Contiguous axial images were obtained from the base of the skull through the vertex without intravenous  contrast. COMPARISON:  07/01/2018 FINDINGS: Brain: Large subdural hematoma over the right cerebral convexity of mixed though predominantly low density is unchanged in size measuring up to 30 mm in thickness with unchanged mass effect including 7 mm of leftward midline shift. Mixed density subdural hematoma over the left cerebral convexity is also unchanged in size measuring 7 mm in thickness. No new intracranial hemorrhage or acute large territory infarct is identified. The ventricles are unchanged in size. Patchy cerebral white matter hypodensities are unchanged and nonspecific but compatible with mild chronic small vessel ischemic disease. The basilar cisterns are patent. Vascular: Calcified atherosclerosis at the skull base. No hyperdense vessel. Skull: No fracture or focal osseous lesion. Sinuses/Orbits: Unchanged small volume  left sphenoid sinus fluid. Clear mastoid air cells. Bilateral cataract extraction. Other: None. IMPRESSION: 1. Unchanged large right and small left left subdural hematomas. Unchanged 7 mm leftward midline shift. 2. No new intracranial abnormality. Electronically Signed   By: Logan Bores M.D.   On: 07/02/2018 13:40   Ct Head Wo Contrast  Addendum Date: 07/01/2018   ADDENDUM REPORT: 07/01/2018 16:28 ADDENDUM: Critical Value/emergent results were called by telephone at the time of interpretation on 07/01/2018 at 1624 hours to Dr. Fredia Sorrow , who verbally acknowledged these results. Electronically Signed   By: Genevie Ann M.D.   On: 07/01/2018 16:28   Result Date: 07/01/2018 CLINICAL DATA:  83 year old female status post polytrauma. Bilateral subdural hematomas since earlier this month. EXAM: CT HEAD WITHOUT CONTRAST CT CERVICAL SPINE WITHOUT CONTRAST TECHNIQUE: Multidetector CT imaging of the head and cervical spine was performed following the standard protocol without intravenous contrast. Multiplanar CT image reconstructions of the cervical spine were also generated. COMPARISON:   06/15/2018 head CT and earlier, including cervical spine CT 05/07/2018. FINDINGS: CT HEAD FINDINGS Brain: Progressed right side mostly hypodense extra-axial collection and associated mass effect on the brain. The collection is now biconvex measuring up to 27 millimeters in thickness, with hyperdensity along the margins. See coronal image 29). There is associated leftward midline shift of 7 millimeters, new since 06/15/2018. At the same time the contralateral left mixed density subdural hematoma has decreased to 5-7 millimeters in thickness along the anterior left convexity. No intra-axial or intraventricular blood. Mass effect on the right lateral ventricle. Stable ventricle size otherwise. Basilar cisterns remain patent. No cortically based acute infarct identified. Vascular: Calcified atherosclerosis at the skull base. Skull: No skull fracture identified. Sinuses/Orbits: Small low-density fluid level in the left sphenoid sinus. Other Visualized paranasal sinuses and mastoids are stable and well pneumatized. Other: No acute orbit or scalp soft tissue findings. CT CERVICAL SPINE FINDINGS Alignment: Stable mild straightening. Cervicothoracic junction alignment is within normal limits. Bilateral posterior element alignment is within normal limits. Skull base and vertebrae: Osteopenia. No acute cervical spine fracture. Soft tissues and spinal canal: No prevertebral fluid or swelling. No visible canal hematoma. Bulky left carotid calcified atherosclerosis again noted. Disc levels: Stable. Advanced mid and lower cervical disc and endplate degeneration. Upper chest: Left posterior 1st through 3rd rib fractures with some periosteal reaction. These are relatively nondisplaced. Negative visible lung apices. Negative noncontrast thoracic inlet. Visible upper thoracic vertebral bodies appear to remain intact. IMPRESSION: 1. Progressed right side extra-axial collection since the subdural hematoma on 06/15/2018. This is now  biconvex, measuring up to 27 mm in thickness, and associated with increased brain mass effect including 7 mm of leftward midline shift. 2. Contralateral left subdural hematoma has regressed, now 5-7 mm thickness. 3. Subacute appearing left posterior 1st through 3rd rib fractures with periosteal reaction. 4. Osteopenia.  No skull or cervical spine fracture identified. Electronically Signed: By: Genevie Ann M.D. On: 07/01/2018 16:20   Ct Cervical Spine Wo Contrast  Addendum Date: 07/01/2018   ADDENDUM REPORT: 07/01/2018 16:28 ADDENDUM: Critical Value/emergent results were called by telephone at the time of interpretation on 07/01/2018 at 1624 hours to Dr. Fredia Sorrow , who verbally acknowledged these results. Electronically Signed   By: Genevie Ann M.D.   On: 07/01/2018 16:28   Result Date: 07/01/2018 CLINICAL DATA:  84 year old female status post polytrauma. Bilateral subdural hematomas since earlier this month. EXAM: CT HEAD WITHOUT CONTRAST CT CERVICAL SPINE WITHOUT CONTRAST TECHNIQUE: Multidetector CT imaging of  the head and cervical spine was performed following the standard protocol without intravenous contrast. Multiplanar CT image reconstructions of the cervical spine were also generated. COMPARISON:  06/15/2018 head CT and earlier, including cervical spine CT 05/07/2018. FINDINGS: CT HEAD FINDINGS Brain: Progressed right side mostly hypodense extra-axial collection and associated mass effect on the brain. The collection is now biconvex measuring up to 27 millimeters in thickness, with hyperdensity along the margins. See coronal image 29). There is associated leftward midline shift of 7 millimeters, new since 06/15/2018. At the same time the contralateral left mixed density subdural hematoma has decreased to 5-7 millimeters in thickness along the anterior left convexity. No intra-axial or intraventricular blood. Mass effect on the right lateral ventricle. Stable ventricle size otherwise. Basilar cisterns  remain patent. No cortically based acute infarct identified. Vascular: Calcified atherosclerosis at the skull base. Skull: No skull fracture identified. Sinuses/Orbits: Small low-density fluid level in the left sphenoid sinus. Other Visualized paranasal sinuses and mastoids are stable and well pneumatized. Other: No acute orbit or scalp soft tissue findings. CT CERVICAL SPINE FINDINGS Alignment: Stable mild straightening. Cervicothoracic junction alignment is within normal limits. Bilateral posterior element alignment is within normal limits. Skull base and vertebrae: Osteopenia. No acute cervical spine fracture. Soft tissues and spinal canal: No prevertebral fluid or swelling. No visible canal hematoma. Bulky left carotid calcified atherosclerosis again noted. Disc levels: Stable. Advanced mid and lower cervical disc and endplate degeneration. Upper chest: Left posterior 1st through 3rd rib fractures with some periosteal reaction. These are relatively nondisplaced. Negative visible lung apices. Negative noncontrast thoracic inlet. Visible upper thoracic vertebral bodies appear to remain intact. IMPRESSION: 1. Progressed right side extra-axial collection since the subdural hematoma on 06/15/2018. This is now biconvex, measuring up to 27 mm in thickness, and associated with increased brain mass effect including 7 mm of leftward midline shift. 2. Contralateral left subdural hematoma has regressed, now 5-7 mm thickness. 3. Subacute appearing left posterior 1st through 3rd rib fractures with periosteal reaction. 4. Osteopenia.  No skull or cervical spine fracture identified. Electronically Signed: By: Genevie Ann M.D. On: 07/01/2018 16:20     I have reviewed the above imaging ; CT Head   ASSESSMENT AND PLAN  84 year old female with history of Alzheimer's dementia and bilateral subdural hemorrhage with mention of right subdural hemorrhage with significant midline shift.  She was started on Keppra yesterday for  seizure prevention.  No seizure activity noted in patient's lethargic likely combination of side effect of the medication and increased ICP due to the subdural hemorrhage.    Metabolic encephalopathy Large right subdural hemorrhage with midline shift  Recommendations Continue Keppra 500 mg BID Management regarding SDH per NS  Sushanth Aroor [facility] Pager Number [phone]      Recommendations EEG _ negative Continue Keppra 500mg  BID Management of SDh per NS

## 2018-07-02 NOTE — Progress Notes (Signed)
EEG completed; results pending.    

## 2018-07-02 NOTE — Progress Notes (Signed)
Patient ID: Bianca Wilson, female   DOB: Oct 08, 1930, 84 y.o.   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 I spent over 40 minutes talking with the daughter and grandson as well as examining the patient. She currently arouses very easily to voice vocalizes follows commands has good left upper Sherry strength at 4-4+ out of 5 needs a little bit more coaxing with the left leg but is antigravity at 4-4 minus out of 5 in the left leg. And this reportedly is a significant improvement from 2 days ago. I agree with Dr. Barbra Sarks interpretation that most of the clinical decline over the first couple days was related to seizure activity as he seems to have rebounded significant well. EEG was reportedly negative so she apparently wasn't having any epileptiform discharges of the time of the EEG. She is currently on Keppra.  CT scan does show a fairly large subdural fluid collection the vast majority of it is chronic with some subacute blood layering and possibly what appears to be a more dense membrane at the base along the peel surface. Surgical treatment of this would potentially some accommodative bur holes might be effective certainly removing the subacute to chronic component however the brain may not reexpand if tethered by that membrane although it did not appear to be present on the CT scan from 2 weeks ago which did show more of a subacute subdural. However patient is in a nursing facility with a somewhat slowly progressive Alzheimer's dementia. The patient does require some assistance with ADLs although had been ambulatory up to the beginning of this week and able to take care of herself to some degree.  I went over all the risks of nonoperative management burr hole craniectomy as well as full craniotomy and I do not recommend full craniotomy for a patient with her underlying medical condition and  status. I did explain that bur holes is fairly well tolerated procedure however does have risk of bleeding and reaccumulation of the brain doesn't expand. The daughter seems very understanding of the risks of surgery and she is not convinced that she wants to proceed forward with surgical intervention yet. Patient is showing signs of clinical improvement so I do think it's okay to ride this out through the weekend continue to observe the patient put her on some steroid to control swelling and potentially facilitate reabsorption of fluid while the daughter and family get together and discuss goals of care and how aggressive to be.  I explained to them that was in the office weekend of my partners Dr. Vertell Limber and Dr. Ellene Route would be rounding on Saturday and Sunday respectively I would be back on Monday and I will be willing to do the burr hole procedure on Monday if needed and if we decided that was in the patient's best interest.

## 2018-07-02 NOTE — Progress Notes (Addendum)
[facility]                                                                              Patient Demographics  Bianca Wilson, is a 84 y.o. female, DOB - 31-Mar-1931, WGN:[id number]  Admit date - 07/01/2018   Admitting Physician Georgette Shell, MD  Outpatient Primary MD for the patient is Raelyn Number, MD  Outpatient specialists:   LOS - 1  days   Medical records reviewed and are as summarized below:    Chief Complaint  Patient presents with  . Cerebrovascular Accident  . Weakness       Brief summary  Per Dr. Wilber Oliphant Wilson is a 84 y.o. female with medical history significant of Alzheimer's dementia, recent hospital admission for bilateral subdural hematoma patient was admitted 06/16/2018 and discharged the same day as family did not want surgery so she was discharged back to assisted living facility.  She was asked to resume aspirin 2 weeks after discharge.  She had a fall on the morning of admission and the staff noticed that she was dragging her left foot and left leg and she was sent to the ER.  She is not on any blood thinners at this time.  Head showed progression of right subdural hematoma compared to the CT on 12/3.  Neurosurgery was consulted    Assessment & Plan       Subdural hematoma ([place]) bilateral, right worse than left, with the left-sided weakness -Patient was recently admitted from 12/3 to 12/4 with fall and bilateral subdural hematomas.  She also has a history of advanced dementia.  Neurosurgery was consulted, however patient's family did not want any surgery, patient was discharged to skilled nursing facility. -Neurosurgery was consulted, recommended observation, EEG and antiseizure medication. - patient was started on Keppra, now obtunded this morning, will have neurology formally see the patient -Given worsening SDH, dementia, dysphagia, overall poor prognosis, placed palliative consult for goals of care. -Discussed with  neurology, Dr. Doug Sou, recommended repeat EEG and CT head, will evaluate patient.  Acute encephalopathy superimposed on dementia, worsened due to subdural hematoma -Likely due to #1, management per #1  Acute kidney injury -Creatinine improving, on 12/3 was 1.6, on admission 1.1, continue gentle hydration  Alzheimer's dementia Per family, she is in currently dementia unit and has been progressively getting worse, takes Aricept  Hyperlipidemia Restart Lipitor once taking p.o.  Diabetes mellitus type 2 Continue sliding scale insulin  Hypertension -Currently stable  Hypothyroidism Continue Synthroid  Dysphagia Likely due to mental status changes, will attempt SLP evaluation once alert and awake  Goals of care Palliative care consulted  Code Status: Full CODE STATUS DVT Prophylaxis:  SCD's Family Communication: Discussed in detail with the patient, all imaging results, lab results explained to the patient's daughter at the bedside  Disposition Plan:   Time Spent in minutes 35 minutes  Procedures:  CT head  Consultants:   Neurosurgery Neurology  Antimicrobials:      Medications  Scheduled Meds: . amLODipine  10 mg Oral Daily  . insulin aspart  0-9 Units Subcutaneous TID WC  . levothyroxine  12.5 mcg  Intravenous Daily  . [START ON 07/03/2018] pneumococcal 23 valent vaccine  0.5 mL Intramuscular Tomorrow-1000   Continuous Infusions: . sodium chloride 50 mL/hr at 07/02/18 0900  . levETIRAcetam 500 mg (07/02/18 0907)   PRN Meds:.   Antibiotics   Anti-infectives (From admission, onward)   None        Subjective:   Bianca Wilson was seen and examined today.  Currently obtunded, unable to obtain any review of system from the patient, opens eyes but not able to follow commands. Still has left-sided weakness.    Objective:   Vitals:   07/01/18 1535 07/02/18 0122 07/02/18 0322 07/02/18 0828  BP:  (!) 142/47 (!) 153/60 (!) 142/65  Pulse:  (!) 59 67 61   Resp:  18 18 18   Temp:  98.5 F (36.9 C) 98.3 F (36.8 C) 98.5 F (36.9 C)  TempSrc:  Oral Oral Axillary  SpO2:  96% 96% 99%  Weight: 73 kg     Height: 5\' 2"  (1.575 m)       Intake/Output Summary (Last 24 hours) at 07/02/2018 1110 Last data filed at 07/02/2018 0900 Gross per 24 hour  Intake 714.97 ml  Output 400 ml  Net 314.97 ml     Wt Readings from Last 3 Encounters:  07/01/18 73 kg  06/15/18 73.2 kg     Exam  General: Somnolent, arousable but does not follow commands  Eyes:   HEENT:  Atraumatic, normocephalic  Cardiovascular: S1 S2 auscultated, Regular rate and rhythm.  Respiratory: CTA  Gastrointestinal: Soft, nontender, nondistended, + bowel sounds  Ext: no pedal edema bilaterally  Neuro: left sided weakness  Musculoskeletal: No digital cyanosis, clubbing  Skin: No rashes  Psych: Somnolent, lethargic   Data Reviewed:  I have personally reviewed following labs and imaging studies  Micro Results No results found for this or any previous visit (from the past 240 hour(s)).  Radiology Reports Ct Head Wo Contrast  Addendum Date: 07/01/2018   ADDENDUM REPORT: 07/01/2018 16:28 ADDENDUM: Critical Value/emergent results were called by telephone at the time of interpretation on 07/01/2018 at 1624 hours to Dr. Fredia Sorrow , who verbally acknowledged these results. Electronically Signed   By: Genevie Ann M.D.   On: 07/01/2018 16:28   Result Date: 07/01/2018 CLINICAL DATA:  84 year old female status post polytrauma. Bilateral subdural hematomas since earlier this month. EXAM: CT HEAD WITHOUT CONTRAST CT CERVICAL SPINE WITHOUT CONTRAST TECHNIQUE: Multidetector CT imaging of the head and cervical spine was performed following the standard protocol without intravenous contrast. Multiplanar CT image reconstructions of the cervical spine were also generated. COMPARISON:  06/15/2018 head CT and earlier, including cervical spine CT 05/07/2018. FINDINGS: CT HEAD  FINDINGS Brain: Progressed right side mostly hypodense extra-axial collection and associated mass effect on the brain. The collection is now biconvex measuring up to 27 millimeters in thickness, with hyperdensity along the margins. See coronal image 29). There is associated leftward midline shift of 7 millimeters, new since 06/15/2018. At the same time the contralateral left mixed density subdural hematoma has decreased to 5-7 millimeters in thickness along the anterior left convexity. No intra-axial or intraventricular blood. Mass effect on the right lateral ventricle. Stable ventricle size otherwise. Basilar cisterns remain patent. No cortically based acute infarct identified. Vascular: Calcified atherosclerosis at the skull base. Skull: No skull fracture identified. Sinuses/Orbits: Small low-density fluid level in the left sphenoid sinus. Other Visualized paranasal sinuses and mastoids are stable and well pneumatized. Other: No acute orbit or scalp soft tissue findings.  CT CERVICAL SPINE FINDINGS Alignment: Stable mild straightening. Cervicothoracic junction alignment is within normal limits. Bilateral posterior element alignment is within normal limits. Skull base and vertebrae: Osteopenia. No acute cervical spine fracture. Soft tissues and spinal canal: No prevertebral fluid or swelling. No visible canal hematoma. Bulky left carotid calcified atherosclerosis again noted. Disc levels: Stable. Advanced mid and lower cervical disc and endplate degeneration. Upper chest: Left posterior 1st through 3rd rib fractures with some periosteal reaction. These are relatively nondisplaced. Negative visible lung apices. Negative noncontrast thoracic inlet. Visible upper thoracic vertebral bodies appear to remain intact. IMPRESSION: 1. Progressed right side extra-axial collection since the subdural hematoma on 06/15/2018. This is now biconvex, measuring up to 27 mm in thickness, and associated with increased brain mass effect  including 7 mm of leftward midline shift. 2. Contralateral left subdural hematoma has regressed, now 5-7 mm thickness. 3. Subacute appearing left posterior 1st through 3rd rib fractures with periosteal reaction. 4. Osteopenia.  No skull or cervical spine fracture identified. Electronically Signed: By: Genevie Ann M.D. On: 07/01/2018 16:20   Ct Head Wo Contrast  Result Date: 06/15/2018 CLINICAL DATA:  84 year old female with intracranial bleed. Alzheimer's. Subsequent encounter. EXAM: CT HEAD WITHOUT CONTRAST TECHNIQUE: Contiguous axial images were obtained from the base of the skull through the vertex without intravenous contrast. COMPARISON:  06/15/2018 2:02 p.m.  05/07/2018. FINDINGS: Brain: Broad-based bilateral subdural hematomas measuring up to 13 mm on the right and 11 mm on left. Local mass effect without midline shift. Focal blood anterior right middle cranial fossa measuring up to 11 mm. Chronic microvascular changes. Global atrophy. No intracranial mass lesion noted on this unenhanced exam. Vascular: Vascular calcifications Skull: No skull fracture. Sinuses/Orbits: Post lens replacement. No acute orbital abnormality. Partial opacification left sphenoid sinus. Other: Mastoid air cells and middle ear cavities are clear. IMPRESSION: 1. Overall no significant change since exam performed earlier today although significant change compared to 05/07/2018. 2. Broad-based bilateral subdural hematomas measuring up to 13 mm on the right and 11 mm on left. Local mass effect without midline shift. 3. Focal blood collection anterior right middle cranial fossa measuring up to 11 mm. This may simply represent clotted blood but is immediately adjacent to the right middle cerebral artery bifurcation and thrombosed aneurysm cannot be entirely excluded. 4. Chronic microvascular changes. Global atrophy. 5. Partial opacification left sphenoid sinus. Electronically Signed   By: Genia Del M.D.   On: 06/15/2018 19:48   Ct  Cervical Spine Wo Contrast  Addendum Date: 07/01/2018   ADDENDUM REPORT: 07/01/2018 16:28 ADDENDUM: Critical Value/emergent results were called by telephone at the time of interpretation on 07/01/2018 at 1624 hours to Dr. Fredia Sorrow , who verbally acknowledged these results. Electronically Signed   By: Genevie Ann M.D.   On: 07/01/2018 16:28   Result Date: 07/01/2018 CLINICAL DATA:  84 year old female status post polytrauma. Bilateral subdural hematomas since earlier this month. EXAM: CT HEAD WITHOUT CONTRAST CT CERVICAL SPINE WITHOUT CONTRAST TECHNIQUE: Multidetector CT imaging of the head and cervical spine was performed following the standard protocol without intravenous contrast. Multiplanar CT image reconstructions of the cervical spine were also generated. COMPARISON:  06/15/2018 head CT and earlier, including cervical spine CT 05/07/2018. FINDINGS: CT HEAD FINDINGS Brain: Progressed right side mostly hypodense extra-axial collection and associated mass effect on the brain. The collection is now biconvex measuring up to 27 millimeters in thickness, with hyperdensity along the margins. See coronal image 29). There is associated leftward midline shift of 7  millimeters, new since 06/15/2018. At the same time the contralateral left mixed density subdural hematoma has decreased to 5-7 millimeters in thickness along the anterior left convexity. No intra-axial or intraventricular blood. Mass effect on the right lateral ventricle. Stable ventricle size otherwise. Basilar cisterns remain patent. No cortically based acute infarct identified. Vascular: Calcified atherosclerosis at the skull base. Skull: No skull fracture identified. Sinuses/Orbits: Small low-density fluid level in the left sphenoid sinus. Other Visualized paranasal sinuses and mastoids are stable and well pneumatized. Other: No acute orbit or scalp soft tissue findings. CT CERVICAL SPINE FINDINGS Alignment: Stable mild straightening.  Cervicothoracic junction alignment is within normal limits. Bilateral posterior element alignment is within normal limits. Skull base and vertebrae: Osteopenia. No acute cervical spine fracture. Soft tissues and spinal canal: No prevertebral fluid or swelling. No visible canal hematoma. Bulky left carotid calcified atherosclerosis again noted. Disc levels: Stable. Advanced mid and lower cervical disc and endplate degeneration. Upper chest: Left posterior 1st through 3rd rib fractures with some periosteal reaction. These are relatively nondisplaced. Negative visible lung apices. Negative noncontrast thoracic inlet. Visible upper thoracic vertebral bodies appear to remain intact. IMPRESSION: 1. Progressed right side extra-axial collection since the subdural hematoma on 06/15/2018. This is now biconvex, measuring up to 27 mm in thickness, and associated with increased brain mass effect including 7 mm of leftward midline shift. 2. Contralateral left subdural hematoma has regressed, now 5-7 mm thickness. 3. Subacute appearing left posterior 1st through 3rd rib fractures with periosteal reaction. 4. Osteopenia.  No skull or cervical spine fracture identified. Electronically Signed: By: Genevie Ann M.D. On: 07/01/2018 16:20   US Renal  Result Date: 06/16/2018 CLINICAL DATA:  Acute kidney injury EXAM: RENAL / URINARY TRACT ULTRASOUND COMPLETE COMPARISON:  None. FINDINGS: Right Kidney: Renal measurements: 10.2 x 3.9 x 4.5 cm = volume: 91 mL. 2 cm lower pole cyst. Negative for hydronephrosis. Left Kidney: Renal measurements: 10.6 x 6.7 x 5.7 cm = volume: 212 mL. Renal size and volume measurement includes left renal cyst measuring 4 x 5 cm with layering debris. Negative for hydronephrosis. Bladder: Normal appearing bladder.  Bilateral ureteral jets. IMPRESSION: Normal renal size. Complex cyst left kidney 4 x 5 cm. Negative for hydronephrosis Electronically Signed   By: Franchot Gallo M.D.   On: 06/16/2018 10:31    Lab  Data:  CBC: Recent Labs  Lab 07/01/18 1531 07/01/18 1541  WBC 6.7  --   NEUTROABS 4.2  --   HGB 12.1 12.2  HCT 38.0 36.0  MCV 93.4  --   PLT 291  --    Basic Metabolic Panel: Recent Labs  Lab 07/01/18 1531 07/01/18 1541 07/02/18 0412  NA 137 137 140  K 4.1 4.0 4.0  CL 98 100 101  CO2 26  --  27  GLUCOSE 118* 114* 114*  BUN 25* 27* 20  CREATININE 1.18* 1.20* 1.22*  CALCIUM 10.0  --  9.6   GFR: Estimated Creatinine Clearance: 31.6 mL/min (A) (by C-G formula based on SCr of 1.22 mg/dL (H)). Liver Function Tests: Recent Labs  Lab 07/01/18 1531  AST 18  ALT 12  ALKPHOS 77  BILITOT 0.5  PROT 6.5  ALBUMIN 3.6   No results for input(s): LIPASE, AMYLASE in the last 168 hours. No results for input(s): AMMONIA in the last 168 hours. Coagulation Profile: Recent Labs  Lab 07/01/18 1531  INR 0.92   Cardiac Enzymes: No results for input(s): CKTOTAL, CKMB, CKMBINDEX, TROPONINI in the last 168 hours.  BNP (last 3 results) No results for input(s): PROBNP in the last 8760 hours. HbA1C: No results for input(s): HGBA1C in the last 72 hours. CBG: Recent Labs  Lab 07/01/18 2131 07/02/18 0618  GLUCAP 127* 129*   Lipid Profile: No results for input(s): CHOL, HDL, LDLCALC, TRIG, CHOLHDL, LDLDIRECT in the last 72 hours. Thyroid Function Tests: No results for input(s): TSH, T4TOTAL, FREET4, T3FREE, THYROIDAB in the last 72 hours. Anemia Panel: No results for input(s): VITAMINB12, FOLATE, FERRITIN, TIBC, IRON, RETICCTPCT in the last 72 hours. Urine analysis:    Component Value Date/Time   COLORURINE YELLOW 07/01/2018 1834   APPEARANCEUR CLEAR 07/01/2018 1834   LABSPEC <1.005 (L) 07/01/2018 1834   PHURINE 5.0 07/01/2018 1834   GLUCOSEU NEGATIVE 07/01/2018 1834   HGBUR NEGATIVE 07/01/2018 1834   BILIRUBINUR NEGATIVE 07/01/2018 1834   KETONESUR NEGATIVE 07/01/2018 1834   PROTEINUR NEGATIVE 07/01/2018 1834   NITRITE NEGATIVE 07/01/2018 1834   LEUKOCYTESUR TRACE (A)  07/01/2018 1834     Doxie Augenstein M.D. [facility] 07/02/2018, 11:10 AM  Pager: [phone] Between 7am to 7pm - call Pager - [phone]  After 7pm go to [email or web address] - password [email or web address]  Call night coverage person covering after 7pm

## 2018-07-03 DIAGNOSIS — F0280 Dementia in other diseases classified elsewhere without behavioral disturbance: Secondary | ICD-10-CM

## 2018-07-03 DIAGNOSIS — G301 Alzheimer's disease with late onset: Secondary | ICD-10-CM

## 2018-07-03 LAB — BASIC METABOLIC PANEL
Anion gap: 15 (ref 5–15)
BUN: 25 mg/dL — ABNORMAL HIGH (ref 8–23)
CO2: 21 mmol/L — ABNORMAL LOW (ref 22–32)
Calcium: 9.2 mg/dL (ref 8.9–10.3)
Chloride: 105 mmol/L (ref 98–111)
Creatinine, Ser: 1.11 mg/dL — ABNORMAL HIGH (ref 0.44–1.00)
GFR calc Af Amer: 52 mL/min — ABNORMAL LOW (ref >60)
GFR calc non Af Amer: 45 mL/min — ABNORMAL LOW (ref >60)
Glucose, Bld: 182 mg/dL — ABNORMAL HIGH (ref 70–99)
POTASSIUM: 4.2 mmol/L (ref 3.5–5.1)
Sodium: 141 mmol/L (ref 135–145)

## 2018-07-03 LAB — GLUCOSE, CAPILLARY
GLUCOSE-CAPILLARY: 156 mg/dL — AB (ref 70–99)
Glucose-Capillary: 161 mg/dL — ABNORMAL HIGH (ref 70–99)
Glucose-Capillary: 156 mg/dL — ABNORMAL HIGH (ref 70–99)
Glucose-Capillary: 145 mg/dL — ABNORMAL HIGH (ref 70–99)

## 2018-07-03 LAB — CBC
HCT: 38.8 % (ref 36.0–46.0)
Hemoglobin: 12.4 g/dL (ref 12.0–15.0)
MCH: 29.7 pg (ref 26.0–34.0)
MCHC: 32 g/dL (ref 30.0–36.0)
MCV: 92.8 fL (ref 80.0–100.0)
PLATELETS: 264 10*3/uL (ref 150–400)
RBC: 4.18 MIL/uL (ref 3.87–5.11)
RDW: 12.3 % (ref 11.5–15.5)
WBC: 5 10*3/uL (ref 4.0–10.5)
nRBC: 0 % (ref 0.0–0.2)

## 2018-07-03 NOTE — Evaluation (Signed)
Clinical/Bedside Swallow Evaluation Patient Details  Name: Bianca Wilson MRN: [id number] Date of Birth: [birth year]  Today's Date: 07/03/2018 Time: SLP Start Time (ACUTE ONLY): 0940 SLP Stop Time (ACUTE ONLY): 1005 SLP Time Calculation (min) (ACUTE ONLY): 25 min  Past Medical History:  Past Medical History:  Diagnosis Date  . Alzheimer disease [facility])    Past Surgical History: History reviewed. No pertinent surgical history. HPI:  Pt is an 84 y/o female admitted from SNF secondary to sustaining a fall. Of note, pt was hospitalized 2 weeks ago for a fall, at which time she had bilateral SDHs.  A new CTH was obtained since she was found down, which showed an expansion of the R SDH. PMH including but not limited to Alzheimer's. Palliative care following; family considering surgical intervention for SDH.   Assessment / Plan / Recommendation Clinical Impression   Patient presents with what appears to be mild, cognitively-based oral dysphagia. No overt signs of aspiration with thin liquids or purees. Pt more alert today, though requires cues to maintain sustained attention to POs, total assist for feeding. There is occasional mild oral holding with liquids, purees, for which pt requires occasional mod verbal and tactile cues. Daughter reports pt is normally able to feed herself at baseline and eats a regular diet. Daughter does occasionally assist when pt puts too much food in her mouth, and states she sometimes cues pt to drink liquids when she holds food in her mouth. Recommend initiating dys 1 (puree), thin liquids, meds crushed in puree. Will follow for tolerance and advancement of solids.   SLP Visit Diagnosis: Dysphagia, unspecified (R13.10)    Aspiration Risk  Mild aspiration risk    Diet Recommendation Dysphagia 1 (Puree);Thin liquid   Liquid Administration via: Cup;Straw Medication Administration: Crushed with puree Supervision: Full supervision/cueing for compensatory  strategies Compensations: Minimize environmental distractions;Slow rate;Small sips/bites(dry spoon/straw to cue swallow if holding)    Other  Recommendations Oral Care Recommendations: Oral care BID   Follow up Recommendations Skilled Nursing facility      Frequency and Duration min 1 x/week  1 week       Prognosis Prognosis for Safe Diet Advancement: Good Barriers to Reach Goals: Cognitive deficits      Swallow Study   General Date of Onset: 07/01/18 HPI: Pt is an 84 y/o female admitted from SNF secondary to sustaining a fall. Of note, pt was hospitalized 2 weeks ago for a fall, at which time she had bilateral SDHs.  A new CTH was obtained since she was found down, which showed an expansion of the R SDH. PMH including but not limited to Alzheimer's. Palliative care following; family considering surgical intervention for SDH. Type of Study: Bedside Swallow Evaluation Previous Swallow Assessment: no Diet Prior to this Study: NPO Temperature Spikes Noted: No Respiratory Status: Room air History of Recent Intubation: No Behavior/Cognition: Alert;Distractible;Requires cueing Oral Cavity Assessment: Within Functional Limits Oral Care Completed by SLP: Yes Oral Cavity - Dentition: Dentures, bottom;Dentures, top Vision: Functional for self-feeding Self-Feeding Abilities: Total assist Patient Positioning: Upright in bed Baseline Vocal Quality: Normal Volitional Cough: Cognitively unable to elicit Volitional Swallow: Unable to elicit    Oral/Motor/Sensory Function Overall Oral Motor/Sensory Function: Within functional limits   Ice Chips Ice chips: Within functional limits Presentation: Spoon   Thin Liquid Thin Liquid: Within functional limits Presentation: Cup;Spoon;Straw Other Comments: occasional min oral holding    Nectar Thick Nectar Thick Liquid: Not tested   Honey Thick Honey Thick Liquid: Not tested  Puree Puree: Impaired Oral Phase Functional Implications: Oral holding    Solid     Solid: Not tested     Deneise Lever, MS, CCC-SLP Speech-Language Pathologist Acute Rehabilitation Services Pager: [phone] Office: [phone]  Aliene Altes 07/03/2018,11:44 AM

## 2018-07-03 NOTE — Progress Notes (Signed)
Palliative: Mrs. Prestage is resting quietly in bed.  She will open her left eye when requested and look in my general direction but not make eye contact.  She quickly closes her eye and rests.  She is surrounded by her family.   [name] and family and I talked about neurosurgery consult yesterday.  [name] states that she feels her mother has had some improvements, and her desires to continue to wait for improvement.  She shares her concern over invasive procedures, but states that she is willing to do bur hole if necessary.  [name] is encouraged by the improvements over the last few days.  Images, labs, and treatment plan discussed in detail with [name] and family.  All questions answered.  PMT will follow-up on Monday.  27 minutes Quinn Axe, NP Palliative Medicine Team [phone] Greater than 50% of this time was spent counseling and coordinating care related to the above assessment and plan

## 2018-07-03 NOTE — Progress Notes (Signed)
[facility]                                                                              Patient Demographics  Bianca Wilson, is a 84 y.o. female, DOB - [birth year], FMB:[id number]  Admit date - 07/01/2018   Admitting Physician Georgette Shell, MD  Outpatient Primary MD for the patient is Raelyn Number, MD  Outpatient specialists:   LOS - 2  days   Medical records reviewed and are as summarized below:    Chief Complaint  Patient presents with  . Cerebrovascular Accident  . Weakness       Brief summary  Per Dr. Wilber Oliphant Bianca Wilson is a 84 y.o. female with medical history significant of Alzheimer's dementia, recent hospital admission for bilateral subdural hematoma patient was admitted 06/16/2018 and discharged the same day as family did not want surgery so she was discharged back to assisted living facility.  She was asked to resume aspirin 2 weeks after discharge.  She had a fall on the morning of admission and the staff noticed that she was dragging her left foot and left leg and she was sent to the ER.  She is not on any blood thinners at this time.  Head showed progression of right subdural hematoma compared to the CT on 12/3.  Neurosurgery was consulted    Assessment & Plan       Subdural hematoma (Bianca Wilson) bilateral, right worse than left, with the left-sided weakness -Patient was recently admitted from 12/3 to 12/4 with fall and bilateral subdural hematomas.  She also has a history of advanced dementia.  Neurosurgery was consulted, however patient's family did not want any surgery, patient was discharged to skilled nursing facility. -Neurosurgery was consulted, recommended observation, EEG and antiseizure medication.  Patient was started on Keppra. -Given somnolence, repeat CT head was done which showed stable SDH, neuro recommended continue Keppra.  EEG showed moderate generalized slowing but no seizures at the time. -Appreciate Dr. Saintclair Halsted,  neurosurgery for the discussion with the family and clearly outlining the management.  Will observe over the weekend and decision regarding the bur hole surgery on Monday. -Patient much more alert and oriented today  Acute metabolic encephalopathy superimposed on dementia, worsened due to subdural hematoma -Likely due to #1, continue Keppra, much more alert and oriented today -Patient was placed on IV Decadron last night.  Acute kidney injury -Creatinine 1.2 now improving to 1.1 with gentle hydration.    Alzheimer's dementia -Per family, she is in currently dementia unit and has been progressively getting worse, takes Aricept - Palliative was consulted for goals of care however family did not make any decisions until management discussion with neurosurgery  Hyperlipidemia Restart Lipitor once taking p.o.  Diabetes mellitus type 2 Continue sliding scale insulin  Hypertension -Currently stable  Hypothyroidism Continue Synthroid  Dysphagia SLP evaluation repeated, started on dysphagia 1 diet  Code Status: Full CODE STATUS DVT Prophylaxis:  SCD's Family Communication: Discussed in detail with the patient, all imaging results, lab results explained to the patient's daughter, granddaughter and other family members in the room  Disposition Plan:   Time Spent  in minutes 25 minutes  Procedures:  CT head  Consultants:   Neurosurgery Neurology Palliative medicine  Antimicrobials:      Medications  Scheduled Meds: . amLODipine  10 mg Oral Daily  . insulin aspart  0-9 Units Subcutaneous TID WC  . levothyroxine  12.5 mcg Intravenous Daily  . pneumococcal 23 valent vaccine  0.5 mL Intramuscular Tomorrow-1000   Continuous Infusions: . sodium chloride 50 mL/hr at 07/03/18 0947  . levETIRAcetam 500 mg (07/03/18 0833)   PRN Meds:.   Antibiotics   Anti-infectives (From admission, onward)   None        Subjective:   Bianca Wilson was seen and examined today.   Much more alert and oriented today, multiple family members at the bedside.  Still has significant left-sided weakness, able to follow commands today.  Denies any nausea vomiting abdominal pain Objective:   Vitals:   07/02/18 1948 07/03/18 0013 07/03/18 0419 07/03/18 0804  BP: (!) 158/67 (!) 149/54 (!) 145/53 (!) 156/61  Pulse: (!) 54 (!) 57 (!) 53 72  Resp: 18 18 18 15   Temp: 97.6 F (36.4 C) 97.7 F (36.5 C) 97.7 F (36.5 C) (!) 97.5 F (36.4 C)  TempSrc: Oral  Oral Axillary  SpO2: 97% 97% 97% 98%  Weight:      Height:        Intake/Output Summary (Last 24 hours) at 07/03/2018 1235 Last data filed at 07/02/2018 1833 Gross per 24 hour  Intake 436.54 ml  Output 840 ml  Net -403.46 ml     Wt Readings from Last 3 Encounters:  07/01/18 73 kg  06/15/18 73.2 kg   Physical Exam  General: Alert and oriented x 3, NAD  Eyes:   HEENT:   Cardiovascular: S1 S2 auscultated, RRR no pedal edema b/l  Respiratory: Decreased breath sound at the bases  Gastrointestinal: Soft, nontender, nondistended, + bowel sounds  Ext: no pedal edema bilaterally  Neuro: Left-sided weakness persisting  Musculoskeletal: No digital cyanosis, clubbing  Skin: No rashes  Psych: Much more alert and oriented today   Data Reviewed:  I have personally reviewed following labs and imaging studies  Micro Results No results found for this or any previous visit (from the past 240 hour(s)).  Radiology Reports Ct Head Wo Contrast  Result Date: 07/02/2018 CLINICAL DATA:  Subdural hematomas. Increased lethargy. EXAM: CT HEAD WITHOUT CONTRAST TECHNIQUE: Contiguous axial images were obtained from the base of the skull through the vertex without intravenous contrast. COMPARISON:  07/01/2018 FINDINGS: Brain: Large subdural hematoma over the right cerebral convexity of mixed though predominantly low density is unchanged in size measuring up to 30 mm in thickness with unchanged mass effect including 7 mm of  leftward midline shift. Mixed density subdural hematoma over the left cerebral convexity is also unchanged in size measuring 7 mm in thickness. No new intracranial hemorrhage or acute large territory infarct is identified. The ventricles are unchanged in size. Patchy cerebral white matter hypodensities are unchanged and nonspecific but compatible with mild chronic small vessel ischemic disease. The basilar cisterns are patent. Vascular: Calcified atherosclerosis at the skull base. No hyperdense vessel. Skull: No fracture or focal osseous lesion. Sinuses/Orbits: Unchanged small volume left sphenoid sinus fluid. Clear mastoid air cells. Bilateral cataract extraction. Other: None. IMPRESSION: 1. Unchanged large right and small left left subdural hematomas. Unchanged 7 mm leftward midline shift. 2. No new intracranial abnormality. Electronically Signed   By: Logan Bores M.D.   On: 07/02/2018 13:40   Ct  Head Wo Contrast  Addendum Date: 07/01/2018   ADDENDUM REPORT: 07/01/2018 16:28 ADDENDUM: Critical Value/emergent results were called by telephone at the time of interpretation on 07/01/2018 at 1624 hours to Dr. Fredia Sorrow , who verbally acknowledged these results. Electronically Signed   By: Genevie Ann M.D.   On: 07/01/2018 16:28   Result Date: 07/01/2018 CLINICAL DATA:  84 year old female status post polytrauma. Bilateral subdural hematomas since earlier this month. EXAM: CT HEAD WITHOUT CONTRAST CT CERVICAL SPINE WITHOUT CONTRAST TECHNIQUE: Multidetector CT imaging of the head and cervical spine was performed following the standard protocol without intravenous contrast. Multiplanar CT image reconstructions of the cervical spine were also generated. COMPARISON:  06/15/2018 head CT and earlier, including cervical spine CT 05/07/2018. FINDINGS: CT HEAD FINDINGS Brain: Progressed right side mostly hypodense extra-axial collection and associated mass effect on the brain. The collection is now biconvex measuring  up to 27 millimeters in thickness, with hyperdensity along the margins. See coronal image 29). There is associated leftward midline shift of 7 millimeters, new since 06/15/2018. At the same time the contralateral left mixed density subdural hematoma has decreased to 5-7 millimeters in thickness along the anterior left convexity. No intra-axial or intraventricular blood. Mass effect on the right lateral ventricle. Stable ventricle size otherwise. Basilar cisterns remain patent. No cortically based acute infarct identified. Vascular: Calcified atherosclerosis at the skull base. Skull: No skull fracture identified. Sinuses/Orbits: Small low-density fluid level in the left sphenoid sinus. Other Visualized paranasal sinuses and mastoids are stable and well pneumatized. Other: No acute orbit or scalp soft tissue findings. CT CERVICAL SPINE FINDINGS Alignment: Stable mild straightening. Cervicothoracic junction alignment is within normal limits. Bilateral posterior element alignment is within normal limits. Skull base and vertebrae: Osteopenia. No acute cervical spine fracture. Soft tissues and spinal canal: No prevertebral fluid or swelling. No visible canal hematoma. Bulky left carotid calcified atherosclerosis again noted. Disc levels: Stable. Advanced mid and lower cervical disc and endplate degeneration. Upper chest: Left posterior 1st through 3rd rib fractures with some periosteal reaction. These are relatively nondisplaced. Negative visible lung apices. Negative noncontrast thoracic inlet. Visible upper thoracic vertebral bodies appear to remain intact. IMPRESSION: 1. Progressed right side extra-axial collection since the subdural hematoma on 06/15/2018. This is now biconvex, measuring up to 27 mm in thickness, and associated with increased brain mass effect including 7 mm of leftward midline shift. 2. Contralateral left subdural hematoma has regressed, now 5-7 mm thickness. 3. Subacute appearing left posterior 1st  through 3rd rib fractures with periosteal reaction. 4. Osteopenia.  No skull or cervical spine fracture identified. Electronically Signed: By: Genevie Ann M.D. On: 07/01/2018 16:20   Ct Head Wo Contrast  Result Date: 06/15/2018 CLINICAL DATA:  84 year old female with intracranial bleed. Alzheimer's. Subsequent encounter. EXAM: CT HEAD WITHOUT CONTRAST TECHNIQUE: Contiguous axial images were obtained from the base of the skull through the vertex without intravenous contrast. COMPARISON:  06/15/2018 2:02 p.m.  05/07/2018. FINDINGS: Brain: Broad-based bilateral subdural hematomas measuring up to 13 mm on the right and 11 mm on left. Local mass effect without midline shift. Focal blood anterior right middle cranial fossa measuring up to 11 mm. Chronic microvascular changes. Global atrophy. No intracranial mass lesion noted on this unenhanced exam. Vascular: Vascular calcifications Skull: No skull fracture. Sinuses/Orbits: Post lens replacement. No acute orbital abnormality. Partial opacification left sphenoid sinus. Other: Mastoid air cells and middle ear cavities are clear. IMPRESSION: 1. Overall no significant change since exam performed earlier today although significant change  compared to 05/07/2018. 2. Broad-based bilateral subdural hematomas measuring up to 13 mm on the right and 11 mm on left. Local mass effect without midline shift. 3. Focal blood collection anterior right middle cranial fossa measuring up to 11 mm. This may simply represent clotted blood but is immediately adjacent to the right middle cerebral artery bifurcation and thrombosed aneurysm cannot be entirely excluded. 4. Chronic microvascular changes. Global atrophy. 5. Partial opacification left sphenoid sinus. Electronically Signed   By: Genia Del M.D.   On: 06/15/2018 19:48   Ct Cervical Spine Wo Contrast  Addendum Date: 07/01/2018   ADDENDUM REPORT: 07/01/2018 16:28 ADDENDUM: Critical Value/emergent results were called by telephone at  the time of interpretation on 07/01/2018 at 1624 hours to Dr. Fredia Sorrow , who verbally acknowledged these results. Electronically Signed   By: Genevie Ann M.D.   On: 07/01/2018 16:28   Result Date: 07/01/2018 CLINICAL DATA:  84 year old female status post polytrauma. Bilateral subdural hematomas since earlier this month. EXAM: CT HEAD WITHOUT CONTRAST CT CERVICAL SPINE WITHOUT CONTRAST TECHNIQUE: Multidetector CT imaging of the head and cervical spine was performed following the standard protocol without intravenous contrast. Multiplanar CT image reconstructions of the cervical spine were also generated. COMPARISON:  06/15/2018 head CT and earlier, including cervical spine CT 05/07/2018. FINDINGS: CT HEAD FINDINGS Brain: Progressed right side mostly hypodense extra-axial collection and associated mass effect on the brain. The collection is now biconvex measuring up to 27 millimeters in thickness, with hyperdensity along the margins. See coronal image 29). There is associated leftward midline shift of 7 millimeters, new since 06/15/2018. At the same time the contralateral left mixed density subdural hematoma has decreased to 5-7 millimeters in thickness along the anterior left convexity. No intra-axial or intraventricular blood. Mass effect on the right lateral ventricle. Stable ventricle size otherwise. Basilar cisterns remain patent. No cortically based acute infarct identified. Vascular: Calcified atherosclerosis at the skull base. Skull: No skull fracture identified. Sinuses/Orbits: Small low-density fluid level in the left sphenoid sinus. Other Visualized paranasal sinuses and mastoids are stable and well pneumatized. Other: No acute orbit or scalp soft tissue findings. CT CERVICAL SPINE FINDINGS Alignment: Stable mild straightening. Cervicothoracic junction alignment is within normal limits. Bilateral posterior element alignment is within normal limits. Skull base and vertebrae: Osteopenia. No acute  cervical spine fracture. Soft tissues and spinal canal: No prevertebral fluid or swelling. No visible canal hematoma. Bulky left carotid calcified atherosclerosis again noted. Disc levels: Stable. Advanced mid and lower cervical disc and endplate degeneration. Upper chest: Left posterior 1st through 3rd rib fractures with some periosteal reaction. These are relatively nondisplaced. Negative visible lung apices. Negative noncontrast thoracic inlet. Visible upper thoracic vertebral bodies appear to remain intact. IMPRESSION: 1. Progressed right side extra-axial collection since the subdural hematoma on 06/15/2018. This is now biconvex, measuring up to 27 mm in thickness, and associated with increased brain mass effect including 7 mm of leftward midline shift. 2. Contralateral left subdural hematoma has regressed, now 5-7 mm thickness. 3. Subacute appearing left posterior 1st through 3rd rib fractures with periosteal reaction. 4. Osteopenia.  No skull or cervical spine fracture identified. Electronically Signed: By: Genevie Ann M.D. On: 07/01/2018 16:20   US Renal  Result Date: 06/16/2018 CLINICAL DATA:  Acute kidney injury EXAM: RENAL / URINARY TRACT ULTRASOUND COMPLETE COMPARISON:  None. FINDINGS: Right Kidney: Renal measurements: 10.2 x 3.9 x 4.5 cm = volume: 91 mL. 2 cm lower pole cyst. Negative for hydronephrosis. Left Kidney: Renal measurements:  10.6 x 6.7 x 5.7 cm = volume: 212 mL. Renal size and volume measurement includes left renal cyst measuring 4 x 5 cm with layering debris. Negative for hydronephrosis. Bladder: Normal appearing bladder.  Bilateral ureteral jets. IMPRESSION: Normal renal size. Complex cyst left kidney 4 x 5 cm. Negative for hydronephrosis Electronically Signed   By: Franchot Gallo M.D.   On: 06/16/2018 10:31    Lab Data:  CBC: Recent Labs  Lab 07/01/18 1531 07/01/18 1541 07/03/18 0517  WBC 6.7  --  5.0  NEUTROABS 4.2  --   --   HGB 12.1 12.2 12.4  HCT 38.0 36.0 38.8  MCV 93.4   --  92.8  PLT 291  --  638   Basic Metabolic Panel: Recent Labs  Lab 07/01/18 1531 07/01/18 1541 07/02/18 0412 07/03/18 0517  NA 137 137 140 141  K 4.1 4.0 4.0 4.2  CL 98 100 101 105  CO2 26  --  27 21*  GLUCOSE 118* 114* 114* 182*  BUN 25* 27* 20 25*  CREATININE 1.18* 1.20* 1.22* 1.11*  CALCIUM 10.0  --  9.6 9.2   GFR: Estimated Creatinine Clearance: 34.7 mL/min (A) (by C-G formula based on SCr of 1.11 mg/dL (H)). Liver Function Tests: Recent Labs  Lab 07/01/18 1531  AST 18  ALT 12  ALKPHOS 77  BILITOT 0.5  PROT 6.5  ALBUMIN 3.6   No results for input(s): LIPASE, AMYLASE in the last 168 hours. No results for input(s): AMMONIA in the last 168 hours. Coagulation Profile: Recent Labs  Lab 07/01/18 1531  INR 0.92   Cardiac Enzymes: No results for input(s): CKTOTAL, CKMB, CKMBINDEX, TROPONINI in the last 168 hours. BNP (last 3 results) No results for input(s): PROBNP in the last 8760 hours. HbA1C: No results for input(s): HGBA1C in the last 72 hours. CBG: Recent Labs  Lab 07/02/18 1130 07/02/18 1558 07/02/18 2226 07/03/18 0624 07/03/18 1143  GLUCAP 93 94 123* 161* 156*   Lipid Profile: No results for input(s): CHOL, HDL, LDLCALC, TRIG, CHOLHDL, LDLDIRECT in the last 72 hours. Thyroid Function Tests: No results for input(s): TSH, T4TOTAL, FREET4, T3FREE, THYROIDAB in the last 72 hours. Anemia Panel: No results for input(s): VITAMINB12, FOLATE, FERRITIN, TIBC, IRON, RETICCTPCT in the last 72 hours. Urine analysis:    Component Value Date/Time   COLORURINE YELLOW 07/01/2018 1834   APPEARANCEUR CLEAR 07/01/2018 1834   LABSPEC <1.005 (L) 07/01/2018 1834   PHURINE 5.0 07/01/2018 1834   GLUCOSEU NEGATIVE 07/01/2018 1834   HGBUR NEGATIVE 07/01/2018 1834   BILIRUBINUR NEGATIVE 07/01/2018 1834   KETONESUR NEGATIVE 07/01/2018 1834   PROTEINUR NEGATIVE 07/01/2018 1834   NITRITE NEGATIVE 07/01/2018 1834   LEUKOCYTESUR TRACE (A) 07/01/2018 1834      Sharone Picchi M.D. [facility] 07/03/2018, 12:35 PM  Pager: [phone] Between 7am to 7pm - call Pager - [phone]  After 7pm go to [email or web address] - password [email or web address]  Call night coverage person covering after 7pm

## 2018-07-03 NOTE — Progress Notes (Signed)
Subjective: Patient reports "I'm feeling good"  Objective: Vital signs in last 24 hours: Temp:  [97.5 F (36.4 C)-98.7 F (37.1 C)] 97.8 F (36.6 C) (12/21 1238) Pulse Rate:  [53-72] 67 (12/21 1238) Resp:  [15-18] 18 (12/21 1238) BP: (143-158)/(48-67) 143/48 (12/21 1238) SpO2:  [96 %-98 %] 96 % (12/21 1238)  Intake/Output from previous day: 12/20 0701 - 12/21 0700 In: 751.5 [I.V.:651.5; IV Piggyback:100] Out: 1240 [Urine:1240] Intake/Output this shift: No intake/output data recorded.  Physical Exam: Able to lift left arm in the air.  Good grip.  Speech clear and fluent.  Disoriented.    Lab Results: Recent Labs    07/01/18 1531 07/01/18 1541 07/03/18 0517  WBC 6.7  --  5.0  HGB 12.1 12.2 12.4  HCT 38.0 36.0 38.8  PLT 291  --  264   BMET Recent Labs    07/02/18 0412 07/03/18 0517  NA 140 141  K 4.0 4.2  CL 101 105  CO2 27 21*  GLUCOSE 114* 182*  BUN 20 25*  CREATININE 1.22* 1.11*  CALCIUM 9.6 9.2    Studies/Results: Ct Head Wo Contrast  Result Date: 07/02/2018 CLINICAL DATA:  Subdural hematomas. Increased lethargy. EXAM: CT HEAD WITHOUT CONTRAST TECHNIQUE: Contiguous axial images were obtained from the base of the skull through the vertex without intravenous contrast. COMPARISON:  07/01/2018 FINDINGS: Brain: Large subdural hematoma over the right cerebral convexity of mixed though predominantly low density is unchanged in size measuring up to 30 mm in thickness with unchanged mass effect including 7 mm of leftward midline shift. Mixed density subdural hematoma over the left cerebral convexity is also unchanged in size measuring 7 mm in thickness. No new intracranial hemorrhage or acute large territory infarct is identified. The ventricles are unchanged in size. Patchy cerebral white matter hypodensities are unchanged and nonspecific but compatible with mild chronic small vessel ischemic disease. The basilar cisterns are patent. Vascular: Calcified atherosclerosis  at the skull base. No hyperdense vessel. Skull: No fracture or focal osseous lesion. Sinuses/Orbits: Unchanged small volume left sphenoid sinus fluid. Clear mastoid air cells. Bilateral cataract extraction. Other: None. IMPRESSION: 1. Unchanged large right and small left left subdural hematomas. Unchanged 7 mm leftward midline shift. 2. No new intracranial abnormality. Electronically Signed   By: Logan Bores M.D.   On: 07/02/2018 13:40   Ct Head Wo Contrast  Addendum Date: 07/01/2018   ADDENDUM REPORT: 07/01/2018 16:28 ADDENDUM: Critical Value/emergent results were called by telephone at the time of interpretation on 07/01/2018 at 1624 hours to Dr. Fredia Sorrow , who verbally acknowledged these results. Electronically Signed   By: Genevie Ann M.D.   On: 07/01/2018 16:28   Result Date: 07/01/2018 CLINICAL DATA:  84 year old female status post polytrauma. Bilateral subdural hematomas since earlier this month. EXAM: CT HEAD WITHOUT CONTRAST CT CERVICAL SPINE WITHOUT CONTRAST TECHNIQUE: Multidetector CT imaging of the head and cervical spine was performed following the standard protocol without intravenous contrast. Multiplanar CT image reconstructions of the cervical spine were also generated. COMPARISON:  06/15/2018 head CT and earlier, including cervical spine CT 05/07/2018. FINDINGS: CT HEAD FINDINGS Brain: Progressed right side mostly hypodense extra-axial collection and associated mass effect on the brain. The collection is now biconvex measuring up to 27 millimeters in thickness, with hyperdensity along the margins. See coronal image 29). There is associated leftward midline shift of 7 millimeters, new since 06/15/2018. At the same time the contralateral left mixed density subdural hematoma has decreased to 5-7 millimeters in thickness along  the anterior left convexity. No intra-axial or intraventricular blood. Mass effect on the right lateral ventricle. Stable ventricle size otherwise. Basilar cisterns  remain patent. No cortically based acute infarct identified. Vascular: Calcified atherosclerosis at the skull base. Skull: No skull fracture identified. Sinuses/Orbits: Small low-density fluid level in the left sphenoid sinus. Other Visualized paranasal sinuses and mastoids are stable and well pneumatized. Other: No acute orbit or scalp soft tissue findings. CT CERVICAL SPINE FINDINGS Alignment: Stable mild straightening. Cervicothoracic junction alignment is within normal limits. Bilateral posterior element alignment is within normal limits. Skull base and vertebrae: Osteopenia. No acute cervical spine fracture. Soft tissues and spinal canal: No prevertebral fluid or swelling. No visible canal hematoma. Bulky left carotid calcified atherosclerosis again noted. Disc levels: Stable. Advanced mid and lower cervical disc and endplate degeneration. Upper chest: Left posterior 1st through 3rd rib fractures with some periosteal reaction. These are relatively nondisplaced. Negative visible lung apices. Negative noncontrast thoracic inlet. Visible upper thoracic vertebral bodies appear to remain intact. IMPRESSION: 1. Progressed right side extra-axial collection since the subdural hematoma on 06/15/2018. This is now biconvex, measuring up to 27 mm in thickness, and associated with increased brain mass effect including 7 mm of leftward midline shift. 2. Contralateral left subdural hematoma has regressed, now 5-7 mm thickness. 3. Subacute appearing left posterior 1st through 3rd rib fractures with periosteal reaction. 4. Osteopenia.  No skull or cervical spine fracture identified. Electronically Signed: By: Genevie Ann M.D. On: 07/01/2018 16:20   Ct Cervical Spine Wo Contrast  Addendum Date: 07/01/2018   ADDENDUM REPORT: 07/01/2018 16:28 ADDENDUM: Critical Value/emergent results were called by telephone at the time of interpretation on 07/01/2018 at 1624 hours to Dr. Fredia Sorrow , who verbally acknowledged these results.  Electronically Signed   By: Genevie Ann M.D.   On: 07/01/2018 16:28   Result Date: 07/01/2018 CLINICAL DATA:  84 year old female status post polytrauma. Bilateral subdural hematomas since earlier this month. EXAM: CT HEAD WITHOUT CONTRAST CT CERVICAL SPINE WITHOUT CONTRAST TECHNIQUE: Multidetector CT imaging of the head and cervical spine was performed following the standard protocol without intravenous contrast. Multiplanar CT image reconstructions of the cervical spine were also generated. COMPARISON:  06/15/2018 head CT and earlier, including cervical spine CT 05/07/2018. FINDINGS: CT HEAD FINDINGS Brain: Progressed right side mostly hypodense extra-axial collection and associated mass effect on the brain. The collection is now biconvex measuring up to 27 millimeters in thickness, with hyperdensity along the margins. See coronal image 29). There is associated leftward midline shift of 7 millimeters, new since 06/15/2018. At the same time the contralateral left mixed density subdural hematoma has decreased to 5-7 millimeters in thickness along the anterior left convexity. No intra-axial or intraventricular blood. Mass effect on the right lateral ventricle. Stable ventricle size otherwise. Basilar cisterns remain patent. No cortically based acute infarct identified. Vascular: Calcified atherosclerosis at the skull base. Skull: No skull fracture identified. Sinuses/Orbits: Small low-density fluid level in the left sphenoid sinus. Other Visualized paranasal sinuses and mastoids are stable and well pneumatized. Other: No acute orbit or scalp soft tissue findings. CT CERVICAL SPINE FINDINGS Alignment: Stable mild straightening. Cervicothoracic junction alignment is within normal limits. Bilateral posterior element alignment is within normal limits. Skull base and vertebrae: Osteopenia. No acute cervical spine fracture. Soft tissues and spinal canal: No prevertebral fluid or swelling. No visible canal hematoma. Bulky  left carotid calcified atherosclerosis again noted. Disc levels: Stable. Advanced mid and lower cervical disc and endplate degeneration. Upper chest: Left  posterior 1st through 3rd rib fractures with some periosteal reaction. These are relatively nondisplaced. Negative visible lung apices. Negative noncontrast thoracic inlet. Visible upper thoracic vertebral bodies appear to remain intact. IMPRESSION: 1. Progressed right side extra-axial collection since the subdural hematoma on 06/15/2018. This is now biconvex, measuring up to 27 mm in thickness, and associated with increased brain mass effect including 7 mm of leftward midline shift. 2. Contralateral left subdural hematoma has regressed, now 5-7 mm thickness. 3. Subacute appearing left posterior 1st through 3rd rib fractures with periosteal reaction. 4. Osteopenia.  No skull or cervical spine fracture identified. Electronically Signed: By: Genevie Ann M.D. On: 07/01/2018 16:20    Assessment/Plan: I discussed situation with patient's daughter as well as pathophysiology of subdural hematomas.  She is comfortable with plan.  Will observe.  Patient is getting better.      LOS: 2 days    Peggyann Shoals, MD 07/03/2018, 3:00 PM

## 2018-07-04 DIAGNOSIS — G300 Alzheimer's disease with early onset: Secondary | ICD-10-CM

## 2018-07-04 DIAGNOSIS — I10 Essential (primary) hypertension: Secondary | ICD-10-CM

## 2018-07-04 LAB — CBC
HCT: 34.3 % — ABNORMAL LOW (ref 36.0–46.0)
Hemoglobin: 11.4 g/dL — ABNORMAL LOW (ref 12.0–15.0)
MCH: 30.7 pg (ref 26.0–34.0)
MCHC: 33.2 g/dL (ref 30.0–36.0)
MCV: 92.5 fL (ref 80.0–100.0)
Platelets: 290 10*3/uL (ref 150–400)
RBC: 3.71 MIL/uL — AB (ref 3.87–5.11)
RDW: 12.8 % (ref 11.5–15.5)
WBC: 8 10*3/uL (ref 4.0–10.5)
nRBC: 0 % (ref 0.0–0.2)

## 2018-07-04 LAB — BASIC METABOLIC PANEL
Anion gap: 11 (ref 5–15)
BUN: 27 mg/dL — ABNORMAL HIGH (ref 8–23)
CO2: 22 mmol/L (ref 22–32)
Calcium: 9 mg/dL (ref 8.9–10.3)
Chloride: 109 mmol/L (ref 98–111)
Creatinine, Ser: 1.05 mg/dL — ABNORMAL HIGH (ref 0.44–1.00)
GFR calc non Af Amer: 48 mL/min — ABNORMAL LOW (ref >60)
GFR, EST AFRICAN AMERICAN: 56 mL/min — AB (ref >60)
Glucose, Bld: 157 mg/dL — ABNORMAL HIGH (ref 70–99)
POTASSIUM: 3.9 mmol/L (ref 3.5–5.1)
Sodium: 142 mmol/L (ref 135–145)

## 2018-07-04 LAB — GLUCOSE, CAPILLARY
GLUCOSE-CAPILLARY: 105 mg/dL — AB (ref 70–99)
Glucose-Capillary: 153 mg/dL — ABNORMAL HIGH (ref 70–99)
Glucose-Capillary: 165 mg/dL — ABNORMAL HIGH (ref 70–99)
Glucose-Capillary: 144 mg/dL — ABNORMAL HIGH (ref 70–99)

## 2018-07-04 MED ORDER — LEVOTHYROXINE SODIUM 25 MCG PO TABS
25.0000 ug | ORAL_TABLET | Freq: Every day | ORAL | Status: DC
  Administered 2018-07-05 – 2018-07-09 (×5): 25 ug via ORAL
  Filled 2018-07-04 (×5): qty 1

## 2018-07-04 MED ORDER — WHITE PETROLATUM EX OINT
TOPICAL_OINTMENT | CUTANEOUS | Status: DC | PRN
  Administered 2018-07-04: 0.2 via TOPICAL
  Filled 2018-07-04: qty 28.35

## 2018-07-04 NOTE — NC FL2 (Signed)
[place] MEDICAID FL2 LEVEL OF CARE SCREENING TOOL     IDENTIFICATION  Patient Name: Bianca Wilson Birthdate: 1931/06/08 Sex: female Admission Date (Current Location): 07/01/2018  [facility] and [place] Number:  Herbalist and Address:  The [place]. [facility], [place] [address], [place], [place] [address]      Provider Number: [id number]  Attending Physician Name and Address:  Mendel Corning, MD  Relative Name and Phone Number:  [name], Daughter, [phone]    Current Level of Care: SNF Recommended Level of Care: [place] Prior Approval Number:    Date Approved/Denied:   PASRR Number:    Discharge Plan: SNF    Current Diagnoses: Patient Active Problem List   Diagnosis Date Noted  . Goals of care, counseling/discussion   . Palliative care by specialist   . HTN (hypertension) 06/16/2018  . AKI (acute kidney injury) ([place]) 06/16/2018  . Alzheimer's dementia ([place]) 06/16/2018  . At risk for falls 06/16/2018  . Depression 06/16/2018  . HLD (hyperlipidemia) 06/16/2018  . Type 2 diabetes mellitus ([place]) 06/16/2018  . Hypothyroidism 06/16/2018  . Subdural hematoma (H[facility]) 06/15/2018    Orientation RESPIRATION BLADDER Height & Weight     Self  Normal Incontinent Weight: 160 lb 15 oz (73 kg) Height:  5\' 2"  (157.5 cm)  BEHAVIORAL SYMPTOMS/MOOD NEUROLOGICAL BOWEL NUTRITION STATUS      Incontinent Diet(NPO)  AMBULATORY STATUS COMMUNICATION OF NEEDS Skin   Limited Assist Verbally Normal(Skin)                       Personal Care Assistance Level of Assistance  Bathing, Feeding, Dressing, Total care Bathing Assistance: Limited assistance Feeding assistance: Independent Dressing Assistance: Limited assistance Total Care Assistance: Limited assistance   Functional Limitations Info  Sight, Hearing, Speech Sight Info: Adequate Hearing Info: Adequate Speech Info: Adequate    SPECIAL CARE FACTORS FREQUENCY  PT (By  licensed PT), OT (By licensed OT), Speech therapy     PT Frequency: 5x/wk OT Frequency: 5x/wk     Speech Therapy Frequency: 5x/wk      Contractures Contractures Info: Not present    Additional Factors Info  Code Status, Allergies Code Status Info: Full Code  Allergies Info: Diphenhydramine-acetaminophen, Other           Current Medications (07/04/2018):  This is the current hospital active medication list Current Facility-Administered Medications  Medication Dose Route Frequency Provider Last Rate Last Dose  . 0.9 %  sodium chloride infusion   Intravenous Continuous Georgette Shell, MD 50 mL/hr at 07/04/18 0827    . amLODipine (NORVASC) tablet 10 mg  10 mg Oral Daily Georgette Shell, MD   10 mg at 07/04/18 0841  . insulin aspart (novoLOG) injection 0-9 Units  0-9 Units Subcutaneous TID WC Georgette Shell, MD   2 Units at 07/04/18 1200  . levETIRAcetam (KEPPRA) IVPB 500 mg/100 mL premix  500 mg Intravenous Q12H Georgette Shell, MD 400 mL/hr at 07/04/18 0829 500 mg at 07/04/18 0829  . [START ON 07/05/2018] levothyroxine (SYNTHROID, LEVOTHROID) tablet 25 mcg  25 mcg Oral Q0600 Einar Grad, [facility]      . pneumococcal 23 valent vaccine (PNU-IMMUNE) injection 0.5 mL  0.5 mL Intramuscular Tomorrow-1000 Rai, Vernelle Emerald, MD   Stopped at 07/03/18 1239     Discharge Medications: Please see discharge summary for a list of discharge medications.  Relevant Imaging Results:  Relevant Lab Results:   Additional  Information SSN: [id number]  Philippa Chester Tanee Henery, LCSWA

## 2018-07-04 NOTE — NC FL2 (Deleted)
[place] MEDICAID FL2 LEVEL OF CARE SCREENING TOOL     IDENTIFICATION  Patient Name: Bianca Wilson Birthdate: 01-19-1931 Sex: female Admission Date (Current Location): 07/01/2018  [facility] and [place] Number:  Herbalist and Address:  The [place]. [facility], [place] [address] [place], [place] [address]      Provider Number: [id number]  Attending Physician Name and Address:  Mendel Corning, MD  Relative Name and Phone Number:  [name], Daughter, [phone]    Current Level of Care: SNF Recommended Level of Care: [place] Prior Approval Number:    Date Approved/Denied:   PASRR Number:    Discharge Plan: SNF    Current Diagnoses: Patient Active Problem List   Diagnosis Date Noted  . Goals of care, counseling/discussion   . Palliative care by specialist   . HTN (hypertension) 06/16/2018  . AKI (acute kidney injury) ([place]) 06/16/2018  . Alzheimer's dementia ([place]) 06/16/2018  . At risk for falls 06/16/2018  . Depression 06/16/2018  . HLD (hyperlipidemia) 06/16/2018  . Type 2 diabetes mellitus ([place]) 06/16/2018  . Hypothyroidism 06/16/2018  . Subdural hematoma (HCC) 06/15/2018    Orientation RESPIRATION BLADDER Height & Weight     Self  Normal Incontinent Weight: 160 lb 15 oz (73 kg) Height:  5\' 2"  (157.5 cm)  BEHAVIORAL SYMPTOMS/MOOD NEUROLOGICAL BOWEL NUTRITION STATUS      Incontinent Diet(NPO)  AMBULATORY STATUS COMMUNICATION OF NEEDS Skin   Limited Assist Verbally Normal(Skin)                       Personal Care Assistance Level of Assistance  Bathing, Feeding, Dressing, Total care Bathing Assistance: Limited assistance Feeding assistance: Independent Dressing Assistance: Limited assistance Total Care Assistance: Limited assistance   Functional Limitations Info  Sight, Hearing, Speech Sight Info: Adequate Hearing Info: Adequate Speech Info: Adequate    SPECIAL CARE FACTORS FREQUENCY  PT (By  licensed PT), OT (By licensed OT), Speech therapy     PT Frequency: 5x/wk OT Frequency: 5x/wk     Speech Therapy Frequency: 5x/wk      Contractures Contractures Info: Not present    Additional Factors Info  Code Status, Allergies Code Status Info: Full Code  Allergies Info: Diphenhydramine-acetaminophen, Other           Current Medications (07/04/2018):  This is the current hospital active medication list Current Facility-Administered Medications  Medication Dose Route Frequency Provider Last Rate Last Dose  . 0.9 %  sodium chloride infusion   Intravenous Continuous Georgette Shell, MD 50 mL/hr at 07/04/18 0827    . amLODipine (NORVASC) tablet 10 mg  10 mg Oral Daily Georgette Shell, MD   10 mg at 07/04/18 0841  . insulin aspart (novoLOG) injection 0-9 Units  0-9 Units Subcutaneous TID WC Georgette Shell, MD   2 Units at 07/04/18 [phone]  . levETIRAcetam (KEPPRA) IVPB 500 mg/100 mL premix  500 mg Intravenous Q12H Georgette Shell, MD 400 mL/hr at 07/04/18 0829 500 mg at 07/04/18 0829  . [START ON 07/05/2018] levothyroxine (SYNTHROID, LEVOTHROID) tablet 25 mcg  25 mcg Oral Q0600 Einar Grad, [facility]      . pneumococcal 23 valent vaccine (PNU-IMMUNE) injection 0.5 mL  0.5 mL Intramuscular Tomorrow-1000 Rai, Vernelle Emerald, MD   Stopped at 07/03/18 1239     Discharge Medications: Please see discharge summary for a list of discharge medications.  Relevant Imaging Results:  Relevant Lab Results:   Additional  Information SSN: [id number]  Philippa Chester Kiernan Atkerson, LCSWA

## 2018-07-04 NOTE — Clinical Social Work Note (Signed)
Clinical Social Work Assessment  Patient Details  Name: Bianca Wilson MRN: [id number] Date of Birth: 1931/02/27  Date of referral:  07/04/18               Reason for consult:  Care Management Concerns, Discharge Planning                Permission sought to share information with:  Case Manager Permission granted to share information::  Yes, Verbal Permission Granted  Name::     [name]  Agency::  [facility]   Relationship::  Daughter  Contact Information:  [phone]  Housing/Transportation Living arrangements for the past 2 months:  Independence of Information:  Adult Children Patient Interpreter Needed:  None Criminal Activity/Legal Involvement Pertinent to Current Situation/Hospitalization:  No - Comment as needed Significant Relationships:  Adult Children Lives with:  Facility Resident Do you feel safe going back to the place where you live?  Yes Need for family participation in patient care:  Yes (Comment)  Care giving concerns:    [name] daughter is concerned about her mothers health. CSW spoke with the patient's daughter about her mother. [name] daughter had questions about her mothers health and current condition. CSW explained that the physical therapist and speech therapist has recommended SNF. [name] daughter was going back and forth about hospice and picking a skilled nursing facility.    Social Worker assessment / plan:    CSW spoke with the daughter about her mother's current condition. CSW spoke with the doctor about the patient's daughter wanting hospice care. Physicians will be meeting tomorrow to discuss a possible surgery. CSW will complete FL2 and send it out.   Employment status:  Retired Nurse, adult PT Recommendations:  [place] / Referral to community resources:  [place]  Patient/Family's Response to care:  [name] daughter is  understanding of her mothers current medical condition and is wanting to lean more towards hospice and comfort care.   Patient/Family's Understanding of and Emotional Response to Diagnosis, Current Treatment, and Prognosis:  [name] daughter is aware and would like to make her mother as comfortable as possible.   Emotional Assessment Appearance:  Appears stated age Attitude/Demeanor/Rapport:  Unable to Assess Affect (typically observed):  Unable to Assess Orientation:  Oriented to Self Alcohol / Substance use:  Not Applicable Psych involvement (Current and /or in the community):  No (Comment)  Discharge Needs  Concerns to be addressed:  Discharge Planning Concerns Readmission within the last 30 days:  Yes Current discharge risk:  Cognitively Impaired, Dependent with Mobility Barriers to Discharge:  Continued Medical Work up   [employer], [place] 07/04/2018, 10:58 AM

## 2018-07-04 NOTE — Progress Notes (Signed)
[facility]                                                                              Patient Demographics  Bianca Wilson, is a 84 y.o. female, DOB - [birth year], KDT:[id number]  Admit date - 07/01/2018   Admitting Physician Georgette Shell, MD  Outpatient Primary MD for the patient is Raelyn Number, MD  Outpatient specialists:   LOS - 3  days   Medical records reviewed and are as summarized below:    Chief Complaint  Patient presents with  . Cerebrovascular Accident  . Weakness       Brief summary  Per Dr. Wilber Oliphant Mandella is a 84 y.o. female with medical history significant of Alzheimer's dementia, recent hospital admission for bilateral subdural hematoma patient was admitted 06/16/2018 and discharged the same day as family did not want surgery so she was discharged back to assisted living facility.  She was asked to resume aspirin 2 weeks after discharge.  She had a fall on the morning of admission and the staff noticed that she was dragging her left foot and left leg and she was sent to the ER.  She is not on any blood thinners at this time.  Head showed progression of right subdural hematoma compared to the CT on 12/3.  Neurosurgery was consulted    Assessment & Plan       Subdural hematoma ([facility]) bilateral, right worse than left, with the left-sided weakness -Patient was recently admitted from 12/3 to 12/4 with fall and bilateral subdural hematomas.  She also has a history of advanced dementia.  Neurosurgery was consulted, however patient's family did not want any surgery, patient was discharged to skilled nursing facility. -Neurosurgery was consulted, recommended observation, EEG and antiseizure medication.  Patient was started on Keppra. -Repeat CT head showed stable SDH, neuro recommended continue Keppra.  EEG showed moderate generalized slowing but no seizures at the time. -Appreciate Dr. Saintclair Halsted, neurosurgery for the discussion with the  family and clearly outlining the management.  Will observe over the weekend and decision regarding the bur hole surgery on Monday. -Patient is much more alert and oriented however left-sided weakness still persists 1/5 -N.p.o. after midnight for possible surgery tomorrow if warranted, will await neurosurgery assessment  Acute metabolic encephalopathy superimposed on dementia, worsened due to subdural hematoma -Likely due to #1, continue Keppra, mental status is improving -Status post IV Decadron x 4 doses  Acute kidney injury -Creatinine improved to 1.0 with gentle hydration  Alzheimer's dementia -Per family, she is in currently dementia unit and has been progressively getting worse, takes Aricept - Palliative medicine consulted for goals of care.   Hyperlipidemia Restart Lipitor once taking p.o.  Diabetes mellitus type 2 Continue sliding scale insulin  Hypertension -Currently stable  Hypothyroidism Continue Synthroid  Dysphagia SLP evaluation repeated, started on dysphagia 1 diet  Code Status: Full CODE STATUS DVT Prophylaxis:  SCD's Family Communication: Discussed in detail with the patient, all imaging results, lab results explained to the patient's daughter and grandson in the room  Disposition Plan:   Time Spent in minutes 25 minutes  Procedures:  CT head  Consultants:   Neurosurgery Neurology Palliative medicine  Antimicrobials:      Medications  Scheduled Meds: . amLODipine  10 mg Oral Daily  . insulin aspart  0-9 Units Subcutaneous TID WC  . [START ON 07/05/2018] levothyroxine  25 mcg Oral Q0600  . pneumococcal 23 valent vaccine  0.5 mL Intramuscular Tomorrow-1000   Continuous Infusions: . sodium chloride 50 mL/hr at 07/04/18 0827  . levETIRAcetam 500 mg (07/04/18 0829)   PRN Meds:.   Antibiotics   Anti-infectives (From admission, onward)   None        Subjective:   Bianca Wilson was seen and examined today.  Alert and awake however  left side weakness still persists.   Denies any nausea vomiting abdominal pain.  No fevers   Objective:   Vitals:   07/04/18 0026 07/04/18 0328 07/04/18 0736 07/04/18 1054  BP: (!) 140/45 (!) 143/50 138/65 (!) 139/46  Pulse: (!) 58 (!) 52 (!) 53 (!) 50  Resp: 18 18 15 17   Temp: 98.5 F (36.9 C) 99.1 F (37.3 C) 98 F (36.7 C) 98.4 F (36.9 C)  TempSrc: Oral Oral Axillary Oral  SpO2: 96% 96% 98% 100%  Weight:      Height:        Intake/Output Summary (Last 24 hours) at 07/04/2018 1259 Last data filed at 07/04/2018 0500 Gross per 24 hour  Intake -  Output 1200 ml  Net -1200 ml     Wt Readings from Last 3 Encounters:  07/01/18 73 kg  06/15/18 73.2 kg    Physical Exam  General: Alert and oriented x 2, NAD  Eyes:   HEENT:    Cardiovascular: S1 S2 auscultated, RRR, No pedal edema b/l  Respiratory: Clear to auscultation bilaterally, no wheezing, rales or rhonchi  Gastrointestinal: Soft, nontender, nondistended, + bowel sounds  Ext: no pedal edema bilaterally  Neuro: Left-sided weakness 1/5, right side 5/5 upper and lower extremities  Musculoskeletal: No digital cyanosis, clubbing  Skin: No rashes  Psych: Flat affect     Data Reviewed:  I have personally reviewed following labs and imaging studies  Micro Results No results found for this or any previous visit (from the past 240 hour(s)).  Radiology Reports Ct Head Wo Contrast  Result Date: 07/02/2018 CLINICAL DATA:  Subdural hematomas. Increased lethargy. EXAM: CT HEAD WITHOUT CONTRAST TECHNIQUE: Contiguous axial images were obtained from the base of the skull through the vertex without intravenous contrast. COMPARISON:  07/01/2018 FINDINGS: Brain: Large subdural hematoma over the right cerebral convexity of mixed though predominantly low density is unchanged in size measuring up to 30 mm in thickness with unchanged mass effect including 7 mm of leftward midline shift. Mixed density subdural hematoma  over the left cerebral convexity is also unchanged in size measuring 7 mm in thickness. No new intracranial hemorrhage or acute large territory infarct is identified. The ventricles are unchanged in size. Patchy cerebral white matter hypodensities are unchanged and nonspecific but compatible with mild chronic small vessel ischemic disease. The basilar cisterns are patent. Vascular: Calcified atherosclerosis at the skull base. No hyperdense vessel. Skull: No fracture or focal osseous lesion. Sinuses/Orbits: Unchanged small volume left sphenoid sinus fluid. Clear mastoid air cells. Bilateral cataract extraction. Other: None. IMPRESSION: 1. Unchanged large right and small left left subdural hematomas. Unchanged 7 mm leftward midline shift. 2. No new intracranial abnormality. Electronically Signed   By: Logan Bores M.D.   On: 07/02/2018 13:40   Ct Head Wo Contrast  Addendum Date: 07/01/2018   ADDENDUM REPORT: 07/01/2018 16:28 ADDENDUM: Critical Value/emergent results were called by telephone at the time of interpretation on 07/01/2018 at 1624 hours to Dr. Fredia Sorrow , who verbally acknowledged these results. Electronically Signed   By: Genevie Ann M.D.   On: 07/01/2018 16:28   Result Date: 07/01/2018 CLINICAL DATA:  84 year old female status post polytrauma. Bilateral subdural hematomas since earlier this month. EXAM: CT HEAD WITHOUT CONTRAST CT CERVICAL SPINE WITHOUT CONTRAST TECHNIQUE: Multidetector CT imaging of the head and cervical spine was performed following the standard protocol without intravenous contrast. Multiplanar CT image reconstructions of the cervical spine were also generated. COMPARISON:  06/15/2018 head CT and earlier, including cervical spine CT 05/07/2018. FINDINGS: CT HEAD FINDINGS Brain: Progressed right side mostly hypodense extra-axial collection and associated mass effect on the brain. The collection is now biconvex measuring up to 27 millimeters in thickness, with hyperdensity  along the margins. See coronal image 29). There is associated leftward midline shift of 7 millimeters, new since 06/15/2018. At the same time the contralateral left mixed density subdural hematoma has decreased to 5-7 millimeters in thickness along the anterior left convexity. No intra-axial or intraventricular blood. Mass effect on the right lateral ventricle. Stable ventricle size otherwise. Basilar cisterns remain patent. No cortically based acute infarct identified. Vascular: Calcified atherosclerosis at the skull base. Skull: No skull fracture identified. Sinuses/Orbits: Small low-density fluid level in the left sphenoid sinus. Other Visualized paranasal sinuses and mastoids are stable and well pneumatized. Other: No acute orbit or scalp soft tissue findings. CT CERVICAL SPINE FINDINGS Alignment: Stable mild straightening. Cervicothoracic junction alignment is within normal limits. Bilateral posterior element alignment is within normal limits. Skull base and vertebrae: Osteopenia. No acute cervical spine fracture. Soft tissues and spinal canal: No prevertebral fluid or swelling. No visible canal hematoma. Bulky left carotid calcified atherosclerosis again noted. Disc levels: Stable. Advanced mid and lower cervical disc and endplate degeneration. Upper chest: Left posterior 1st through 3rd rib fractures with some periosteal reaction. These are relatively nondisplaced. Negative visible lung apices. Negative noncontrast thoracic inlet. Visible upper thoracic vertebral bodies appear to remain intact. IMPRESSION: 1. Progressed right side extra-axial collection since the subdural hematoma on 06/15/2018. This is now biconvex, measuring up to 27 mm in thickness, and associated with increased brain mass effect including 7 mm of leftward midline shift. 2. Contralateral left subdural hematoma has regressed, now 5-7 mm thickness. 3. Subacute appearing left posterior 1st through 3rd rib fractures with periosteal reaction.  4. Osteopenia.  No skull or cervical spine fracture identified. Electronically Signed: By: Genevie Ann M.D. On: 07/01/2018 16:20   Ct Head Wo Contrast  Result Date: 06/15/2018 CLINICAL DATA:  84 year old female with intracranial bleed. Alzheimer's. Subsequent encounter. EXAM: CT HEAD WITHOUT CONTRAST TECHNIQUE: Contiguous axial images were obtained from the base of the skull through the vertex without intravenous contrast. COMPARISON:  06/15/2018 2:02 p.m.  05/07/2018. FINDINGS: Brain: Broad-based bilateral subdural hematomas measuring up to 13 mm on the right and 11 mm on left. Local mass effect without midline shift. Focal blood anterior right middle cranial fossa measuring up to 11 mm. Chronic microvascular changes. Global atrophy. No intracranial mass lesion noted on this unenhanced exam. Vascular: Vascular calcifications Skull: No skull fracture. Sinuses/Orbits: Post lens replacement. No acute orbital abnormality. Partial opacification left sphenoid sinus. Other: Mastoid air cells and middle ear cavities are clear. IMPRESSION: 1. Overall no significant change since exam performed earlier today although significant change compared to 05/07/2018. 2.  Broad-based bilateral subdural hematomas measuring up to 13 mm on the right and 11 mm on left. Local mass effect without midline shift. 3. Focal blood collection anterior right middle cranial fossa measuring up to 11 mm. This may simply represent clotted blood but is immediately adjacent to the right middle cerebral artery bifurcation and thrombosed aneurysm cannot be entirely excluded. 4. Chronic microvascular changes. Global atrophy. 5. Partial opacification left sphenoid sinus. Electronically Signed   By: Genia Del M.D.   On: 06/15/2018 19:48   Ct Cervical Spine Wo Contrast  Addendum Date: 07/01/2018   ADDENDUM REPORT: 07/01/2018 16:28 ADDENDUM: Critical Value/emergent results were called by telephone at the time of interpretation on 07/01/2018 at 1624  hours to Dr. Fredia Sorrow , who verbally acknowledged these results. Electronically Signed   By: Genevie Ann M.D.   On: 07/01/2018 16:28   Result Date: 07/01/2018 CLINICAL DATA:  84 year old female status post polytrauma. Bilateral subdural hematomas since earlier this month. EXAM: CT HEAD WITHOUT CONTRAST CT CERVICAL SPINE WITHOUT CONTRAST TECHNIQUE: Multidetector CT imaging of the head and cervical spine was performed following the standard protocol without intravenous contrast. Multiplanar CT image reconstructions of the cervical spine were also generated. COMPARISON:  06/15/2018 head CT and earlier, including cervical spine CT 05/07/2018. FINDINGS: CT HEAD FINDINGS Brain: Progressed right side mostly hypodense extra-axial collection and associated mass effect on the brain. The collection is now biconvex measuring up to 27 millimeters in thickness, with hyperdensity along the margins. See coronal image 29). There is associated leftward midline shift of 7 millimeters, new since 06/15/2018. At the same time the contralateral left mixed density subdural hematoma has decreased to 5-7 millimeters in thickness along the anterior left convexity. No intra-axial or intraventricular blood. Mass effect on the right lateral ventricle. Stable ventricle size otherwise. Basilar cisterns remain patent. No cortically based acute infarct identified. Vascular: Calcified atherosclerosis at the skull base. Skull: No skull fracture identified. Sinuses/Orbits: Small low-density fluid level in the left sphenoid sinus. Other Visualized paranasal sinuses and mastoids are stable and well pneumatized. Other: No acute orbit or scalp soft tissue findings. CT CERVICAL SPINE FINDINGS Alignment: Stable mild straightening. Cervicothoracic junction alignment is within normal limits. Bilateral posterior element alignment is within normal limits. Skull base and vertebrae: Osteopenia. No acute cervical spine fracture. Soft tissues and spinal canal:  No prevertebral fluid or swelling. No visible canal hematoma. Bulky left carotid calcified atherosclerosis again noted. Disc levels: Stable. Advanced mid and lower cervical disc and endplate degeneration. Upper chest: Left posterior 1st through 3rd rib fractures with some periosteal reaction. These are relatively nondisplaced. Negative visible lung apices. Negative noncontrast thoracic inlet. Visible upper thoracic vertebral bodies appear to remain intact. IMPRESSION: 1. Progressed right side extra-axial collection since the subdural hematoma on 06/15/2018. This is now biconvex, measuring up to 27 mm in thickness, and associated with increased brain mass effect including 7 mm of leftward midline shift. 2. Contralateral left subdural hematoma has regressed, now 5-7 mm thickness. 3. Subacute appearing left posterior 1st through 3rd rib fractures with periosteal reaction. 4. Osteopenia.  No skull or cervical spine fracture identified. Electronically Signed: By: Genevie Ann M.D. On: 07/01/2018 16:20   US Renal  Result Date: 06/16/2018 CLINICAL DATA:  Acute kidney injury EXAM: RENAL / URINARY TRACT ULTRASOUND COMPLETE COMPARISON:  None. FINDINGS: Right Kidney: Renal measurements: 10.2 x 3.9 x 4.5 cm = volume: 91 mL. 2 cm lower pole cyst. Negative for hydronephrosis. Left Kidney: Renal measurements: 10.6 x 6.7 x  5.7 cm = volume: 212 mL. Renal size and volume measurement includes left renal cyst measuring 4 x 5 cm with layering debris. Negative for hydronephrosis. Bladder: Normal appearing bladder.  Bilateral ureteral jets. IMPRESSION: Normal renal size. Complex cyst left kidney 4 x 5 cm. Negative for hydronephrosis Electronically Signed   By: Franchot Gallo M.D.   On: 06/16/2018 10:31    Lab Data:  CBC: Recent Labs  Lab 07/01/18 1531 07/01/18 1541 07/03/18 0517 07/04/18 0525  WBC 6.7  --  5.0 8.0  NEUTROABS 4.2  --   --   --   HGB 12.1 12.2 12.4 11.4*  HCT 38.0 36.0 38.8 34.3*  MCV 93.4  --  92.8 92.5    PLT 291  --  264 462   Basic Metabolic Panel: Recent Labs  Lab 07/01/18 1531 07/01/18 1541 07/02/18 0412 07/03/18 0517 07/04/18 0525  NA 137 137 140 141 142  K 4.1 4.0 4.0 4.2 3.9  CL 98 100 101 105 109  CO2 26  --  27 21* 22  GLUCOSE 118* 114* 114* 182* 157*  BUN 25* 27* 20 25* 27*  CREATININE 1.18* 1.20* 1.22* 1.11* 1.05*  CALCIUM 10.0  --  9.6 9.2 9.0   GFR: Estimated Creatinine Clearance: 36.7 mL/min (A) (by C-G formula based on SCr of 1.05 mg/dL (H)). Liver Function Tests: Recent Labs  Lab 07/01/18 1531  AST 18  ALT 12  ALKPHOS 77  BILITOT 0.5  PROT 6.5  ALBUMIN 3.6   No results for input(s): LIPASE, AMYLASE in the last 168 hours. No results for input(s): AMMONIA in the last 168 hours. Coagulation Profile: Recent Labs  Lab 07/01/18 1531  INR 0.92   Cardiac Enzymes: No results for input(s): CKTOTAL, CKMB, CKMBINDEX, TROPONINI in the last 168 hours. BNP (last 3 results) No results for input(s): PROBNP in the last 8760 hours. HbA1C: No results for input(s): HGBA1C in the last 72 hours. CBG: Recent Labs  Lab 07/03/18 1143 07/03/18 1615 07/03/18 2130 07/04/18 0615 07/04/18 1135  GLUCAP 156* 145* 156* 153* 165*   Lipid Profile: No results for input(s): CHOL, HDL, LDLCALC, TRIG, CHOLHDL, LDLDIRECT in the last 72 hours. Thyroid Function Tests: No results for input(s): TSH, T4TOTAL, FREET4, T3FREE, THYROIDAB in the last 72 hours. Anemia Panel: No results for input(s): VITAMINB12, FOLATE, FERRITIN, TIBC, IRON, RETICCTPCT in the last 72 hours. Urine analysis:    Component Value Date/Time   COLORURINE YELLOW 07/01/2018 1834   APPEARANCEUR CLEAR 07/01/2018 1834   LABSPEC <1.005 (L) 07/01/2018 1834   PHURINE 5.0 07/01/2018 1834   GLUCOSEU NEGATIVE 07/01/2018 1834   HGBUR NEGATIVE 07/01/2018 1834   BILIRUBINUR NEGATIVE 07/01/2018 1834   KETONESUR NEGATIVE 07/01/2018 1834   PROTEINUR NEGATIVE 07/01/2018 1834   NITRITE NEGATIVE 07/01/2018 1834    LEUKOCYTESUR TRACE (A) 07/01/2018 1834     Ripudeep Rai M.D. [facility] 07/04/2018, 12:59 PM  Pager: [phone] Between 7am to 7pm - call Pager - [phone]  After 7pm go to [email or web address] - password [email or web address]  Call night coverage person covering after 7pm

## 2018-07-04 NOTE — Progress Notes (Addendum)
Assessment has been completed. fl2 is pending due to [place] [facility] being down. Will need to get PASSAR.   Daughter wants hospice. I informed the doctor. We are awaiting discussion about possible surgery.   CSW will continue to follow.   NEEDS: PASSAR and Fl2 needs to be finished, is currently in pending status.   Domenic Schwab, MSW, [facility]

## 2018-07-04 NOTE — Progress Notes (Signed)
New orders given EKG completed

## 2018-07-04 NOTE — Progress Notes (Signed)
Heart rate has been 45 or below sine 1441, no other symptoms noted, doctor paged

## 2018-07-04 NOTE — Progress Notes (Signed)
Patient ID: Bianca Wilson, female   DOB: 11/23/1930, 84 y.o.   MRN: [id number] Vital signs are stable Arouses to voice and moves all extremities but left much less than right Awaiting decision regarding bur holes tomorrow We will order CT of head for the a.m.

## 2018-07-05 ENCOUNTER — Inpatient Hospital Stay (HOSPITAL_COMMUNITY): Payer: MEDICARE

## 2018-07-05 DIAGNOSIS — F0281 Dementia in other diseases classified elsewhere with behavioral disturbance: Secondary | ICD-10-CM

## 2018-07-05 LAB — GLUCOSE, CAPILLARY
Glucose-Capillary: 145 mg/dL — ABNORMAL HIGH (ref 70–99)
Glucose-Capillary: 116 mg/dL — ABNORMAL HIGH (ref 70–99)
Glucose-Capillary: 134 mg/dL — ABNORMAL HIGH (ref 70–99)
Glucose-Capillary: 87 mg/dL (ref 70–99)

## 2018-07-05 LAB — BASIC METABOLIC PANEL
Anion gap: 6 (ref 5–15)
BUN: 23 mg/dL (ref 8–23)
CO2: 27 mmol/L (ref 22–32)
Calcium: 8.7 mg/dL — ABNORMAL LOW (ref 8.9–10.3)
Chloride: 108 mmol/L (ref 98–111)
Creatinine, Ser: 1 mg/dL (ref 0.44–1.00)
GFR calc Af Amer: 59 mL/min — ABNORMAL LOW (ref >60)
GFR calc non Af Amer: 51 mL/min — ABNORMAL LOW (ref >60)
Glucose, Bld: 148 mg/dL — ABNORMAL HIGH (ref 70–99)
Potassium: 3.9 mmol/L (ref 3.5–5.1)
Sodium: 141 mmol/L (ref 135–145)

## 2018-07-05 LAB — CBC
HEMATOCRIT: 34.9 % — AB (ref 36.0–46.0)
Hemoglobin: 11.5 g/dL — ABNORMAL LOW (ref 12.0–15.0)
MCH: 30.8 pg (ref 26.0–34.0)
MCHC: 33 g/dL (ref 30.0–36.0)
MCV: 93.6 fL (ref 80.0–100.0)
Platelets: 280 10*3/uL (ref 150–400)
RBC: 3.73 MIL/uL — ABNORMAL LOW (ref 3.87–5.11)
RDW: 12.8 % (ref 11.5–15.5)
WBC: 6.4 10*3/uL (ref 4.0–10.5)
nRBC: 0 % (ref 0.0–0.2)

## 2018-07-05 MED ORDER — MORPHINE SULFATE (PF) 2 MG/ML IV SOLN: 0.5000 mg | INTRAVENOUS | Status: DC | PRN

## 2018-07-05 MED ORDER — ACETAMINOPHEN 325 MG PO TABS
650.0000 mg | ORAL_TABLET | ORAL | Status: DC | PRN
  Administered 2018-07-05: 650 mg via ORAL
  Filled 2018-07-05: qty 2

## 2018-07-05 MED ORDER — LEVETIRACETAM IN NACL 1000 MG/100ML IV SOLN
1000.0000 mg | Freq: Two times a day (BID) | INTRAVENOUS | Status: DC
  Administered 2018-07-05 – 2018-07-08 (×6): 1000 mg via INTRAVENOUS
  Filled 2018-07-05 (×6): qty 100

## 2018-07-05 NOTE — Care Management Important Message (Signed)
Important Message  Patient Details  Name: Bianca Wilson MRN: [id number] Date of Birth: 09-Dec-1930   Medicare Important Message Given:  Yes    Orbie Pyo 07/05/2018, 4:13 PM

## 2018-07-05 NOTE — Progress Notes (Signed)
Pt went down for repeat CT scan and back to the unit

## 2018-07-05 NOTE — Progress Notes (Signed)
[facility]                                                                              Patient Demographics  Bianca Wilson, is a 84 y.o. female, DOB - [birth year], VWU:[id number]  Admit date - 07/01/2018   Admitting Physician Bianca Shell, MD  Outpatient Primary MD for the patient is Bianca Number, MD  Outpatient specialists:   LOS - 4  days   Medical records reviewed and are as summarized below:    Chief Complaint  Patient presents with  . Cerebrovascular Accident  . Weakness       Brief summary  Per Dr. Wilber Oliphant Wilson is a 84 y.o. female with medical history significant of Alzheimer's dementia, recent hospital admission for bilateral subdural hematoma patient was admitted 06/16/2018 and discharged the same day as family did not want surgery so she was discharged back to assisted living facility.  She was asked to resume aspirin 2 weeks after discharge.  She had a fall on the morning of admission and the staff noticed that she was dragging her left foot and left leg and she was sent to the ER.  She is not on any blood thinners at this time.  Head showed progression of right subdural hematoma compared to the CT on 12/3.  Neurosurgery was consulted    Assessment & Plan       Subdural hematoma ([place]) bilateral, right worse than left, with the left-sided weakness -Patient was recently admitted from 12/3 to 12/4 with fall and bilateral subdural hematomas.  She also has a history of advanced dementia.  Neurosurgery was consulted, however patient's family did not want any surgery, patient was discharged to skilled nursing facility. -Neurosurgery was consulted, recommended observation, EEG and antiseizure medication.  Patient was started on Keppra. -Repeat CT head showed stable SDH, neuro recommended continue Keppra.  EEG showed moderate generalized slowing but no seizures at the time. -Alert and awake, left-sided weakness still persists -Family  waiting to discuss with neurosurgery CT head shows large right-sided subdural collection with midline shift, left-sided subdural collection with maximal thickness of 7 mm  Acute metabolic encephalopathy superimposed on dementia, worsened due to subdural hematoma -Likely due to #1, continue Keppra, received IV Decadron 4 doses    Acute kidney injury -Resolved, creatinine 1.0  Alzheimer's dementia -Per family, she is in currently dementia unit and has been progressively getting worse, takes Aricept -Palliative medicine has been consulted for goals of care, patient's daughter wants to discuss with neurosurgery before any decisions  Hyperlipidemia Restart Lipitor once taking p.o.  Diabetes mellitus type 2 Continue sliding scale insulin  Hypertension -Currently stable  Hypothyroidism Continue Synthroid  Dysphagia SLP evaluation repeated, started on dysphagia 1 diet, currently n.p.o. for possible surgery  Code Status: Full CODE STATUS DVT Prophylaxis:  SCD's Family Communication: Discussed in detail with the patient, all imaging results, lab results explained to the patient's daughter and granddaughter in the room.   Disposition Plan: Awaiting neurosurgery recommendations  Time Spent in minutes 25 minutes  Procedures:  CT head  Consultants:   Neurosurgery Neurology Palliative medicine  Antimicrobials:  Medications  Scheduled Meds: . amLODipine  10 mg Oral Daily  . insulin aspart  0-9 Units Subcutaneous TID WC  . levothyroxine  25 mcg Oral Q0600  . pneumococcal 23 valent vaccine  0.5 mL Intramuscular Tomorrow-1000   Continuous Infusions: . sodium chloride 50 mL/hr at 07/04/18 1827  . levETIRAcetam 500 mg (07/05/18 0837)   PRN Meds:.   Antibiotics   Anti-infectives (From admission, onward)   None        Subjective:   Bianca Wilson was seen and examined today.  Alert and awake, following most commands, left-sided weakness persisting.  No fevers or  chills, no nausea vomiting or pain.     Objective:   Vitals:   07/04/18 2109 07/05/18 0037 07/05/18 0415 07/05/18 0756  BP: (!) 139/49 (!) 151/51 (!) 149/60 (!) 144/48  Pulse: (!) 52 (!) 55 60 (!) 53  Resp:   16 18  Temp: (!) 97.5 F (36.4 C) 98.3 F (36.8 C) 98 F (36.7 C) 98 F (36.7 C)  TempSrc: Oral Oral Axillary Axillary  SpO2: 97% 99% 97% 99%  Weight:      Height:        Intake/Output Summary (Last 24 hours) at 07/05/2018 1103 Last data filed at 07/05/2018 7564 Gross per 24 hour  Intake 500 ml  Output -  Net 500 ml     Wt Readings from Last 3 Encounters:  07/01/18 73 kg  06/15/18 73.2 kg   Physical Exam  General: Alert and oriented x self and person, NAD  Eyes:  HEENT:    Cardiovascular: S1 S2 auscultated, RRR No pedal edema b/l  Respiratory: CTA B  Gastrointestinal: Soft, NT, ND, NBS  Ext: no pedal edema bilaterally  Neuro: Left-sided weakness 1/5  Musculoskeletal: No digital cyanosis, clubbing  Skin: No rashes  Psych: Flat affect     Data Reviewed:  I have personally reviewed following labs and imaging studies  Micro Results No results found for this or any previous visit (from the past 240 hour(s)).  Radiology Reports Ct Head Wo Contrast  Result Date: 07/05/2018 CLINICAL DATA:  84 year old female with intracranial hemorrhage. Follow-up. Alzheimer's. Subsequent encounter. EXAM: CT HEAD WITHOUT CONTRAST TECHNIQUE: Contiguous axial images were obtained from the base of the skull through the vertex without intravenous contrast. COMPARISON:  Several prior exams most recent 07/02/2018. FINDINGS: Brain: Large right-sided subdural collection measuring approximately 12.5 x 3 x 7.3 cm with compression of the right lateral ventricle causing 7 mm midline shift to the right and downward displacement right lateral ventricle relatively similar to prior exam. Similar appearance of broad-based left-sided subdural collection with maximal thickness of 7 mm  without significant change. No new area of intracranial hemorrhage. Chronic microvascular changes without CT evidence of large acute infarct. Atrophy. No intracranial mass lesion noted on this unenhanced exam. Vascular: Vascular calcifications.  No acute hyperdense vessel. Skull: No acute skull fracture. Sinuses/Orbits: Post lens replacement. No acute orbital abnormality. Partial opacification left sphenoid sinus. Other: Mastoid air cells and middle ear cavities are clear. IMPRESSION: 1. Relatively similar appearance to prior exam. 2. Large right-sided subdural collection measuring approximately 12.5 x 3 x 7.3 cm with compression of the right lateral ventricle causing 7 mm midline shift to the right and downward displacement right lateral ventricle relatively similar to prior exam. 3. Similar appearance of broad-based left-sided subdural collection with maximal thickness of 7 mm. 4. No new area of intracranial hemorrhage. 5. Chronic microvascular changes. 6. Atrophy. Electronically Signed   By: Remo Lipps  Jeannine Kitten M.D.   On: 07/05/2018 08:08   Ct Head Wo Contrast  Result Date: 07/02/2018 CLINICAL DATA:  Subdural hematomas. Increased lethargy. EXAM: CT HEAD WITHOUT CONTRAST TECHNIQUE: Contiguous axial images were obtained from the base of the skull through the vertex without intravenous contrast. COMPARISON:  07/01/2018 FINDINGS: Brain: Large subdural hematoma over the right cerebral convexity of mixed though predominantly low density is unchanged in size measuring up to 30 mm in thickness with unchanged mass effect including 7 mm of leftward midline shift. Mixed density subdural hematoma over the left cerebral convexity is also unchanged in size measuring 7 mm in thickness. No new intracranial hemorrhage or acute large territory infarct is identified. The ventricles are unchanged in size. Patchy cerebral white matter hypodensities are unchanged and nonspecific but compatible with mild chronic small vessel ischemic  disease. The basilar cisterns are patent. Vascular: Calcified atherosclerosis at the skull base. No hyperdense vessel. Skull: No fracture or focal osseous lesion. Sinuses/Orbits: Unchanged small volume left sphenoid sinus fluid. Clear mastoid air cells. Bilateral cataract extraction. Other: None. IMPRESSION: 1. Unchanged large right and small left left subdural hematomas. Unchanged 7 mm leftward midline shift. 2. No new intracranial abnormality. Electronically Signed   By: Logan Bores M.D.   On: 07/02/2018 13:40   Ct Head Wo Contrast  Addendum Date: 07/01/2018   ADDENDUM REPORT: 07/01/2018 16:28 ADDENDUM: Critical Value/emergent results were called by telephone at the time of interpretation on 07/01/2018 at 1624 hours to Dr. Fredia Sorrow , who verbally acknowledged these results. Electronically Signed   By: Genevie Ann M.D.   On: 07/01/2018 16:28   Result Date: 07/01/2018 CLINICAL DATA:  84 year old female status post polytrauma. Bilateral subdural hematomas since earlier this month. EXAM: CT HEAD WITHOUT CONTRAST CT CERVICAL SPINE WITHOUT CONTRAST TECHNIQUE: Multidetector CT imaging of the head and cervical spine was performed following the standard protocol without intravenous contrast. Multiplanar CT image reconstructions of the cervical spine were also generated. COMPARISON:  06/15/2018 head CT and earlier, including cervical spine CT 05/07/2018. FINDINGS: CT HEAD FINDINGS Brain: Progressed right side mostly hypodense extra-axial collection and associated mass effect on the brain. The collection is now biconvex measuring up to 27 millimeters in thickness, with hyperdensity along the margins. See coronal image 29). There is associated leftward midline shift of 7 millimeters, new since 06/15/2018. At the same time the contralateral left mixed density subdural hematoma has decreased to 5-7 millimeters in thickness along the anterior left convexity. No intra-axial or intraventricular blood. Mass effect on  the right lateral ventricle. Stable ventricle size otherwise. Basilar cisterns remain patent. No cortically based acute infarct identified. Vascular: Calcified atherosclerosis at the skull base. Skull: No skull fracture identified. Sinuses/Orbits: Small low-density fluid level in the left sphenoid sinus. Other Visualized paranasal sinuses and mastoids are stable and well pneumatized. Other: No acute orbit or scalp soft tissue findings. CT CERVICAL SPINE FINDINGS Alignment: Stable mild straightening. Cervicothoracic junction alignment is within normal limits. Bilateral posterior element alignment is within normal limits. Skull base and vertebrae: Osteopenia. No acute cervical spine fracture. Soft tissues and spinal canal: No prevertebral fluid or swelling. No visible canal hematoma. Bulky left carotid calcified atherosclerosis again noted. Disc levels: Stable. Advanced mid and lower cervical disc and endplate degeneration. Upper chest: Left posterior 1st through 3rd rib fractures with some periosteal reaction. These are relatively nondisplaced. Negative visible lung apices. Negative noncontrast thoracic inlet. Visible upper thoracic vertebral bodies appear to remain intact. IMPRESSION: 1. Progressed right side extra-axial collection since  the subdural hematoma on 06/15/2018. This is now biconvex, measuring up to 27 mm in thickness, and associated with increased brain mass effect including 7 mm of leftward midline shift. 2. Contralateral left subdural hematoma has regressed, now 5-7 mm thickness. 3. Subacute appearing left posterior 1st through 3rd rib fractures with periosteal reaction. 4. Osteopenia.  No skull or cervical spine fracture identified. Electronically Signed: By: Genevie Ann M.D. On: 07/01/2018 16:20   Ct Head Wo Contrast  Result Date: 06/15/2018 CLINICAL DATA:  84 year old female with intracranial bleed. Alzheimer's. Subsequent encounter. EXAM: CT HEAD WITHOUT CONTRAST TECHNIQUE: Contiguous axial images  were obtained from the base of the skull through the vertex without intravenous contrast. COMPARISON:  06/15/2018 2:02 p.m.  05/07/2018. FINDINGS: Brain: Broad-based bilateral subdural hematomas measuring up to 13 mm on the right and 11 mm on left. Local mass effect without midline shift. Focal blood anterior right middle cranial fossa measuring up to 11 mm. Chronic microvascular changes. Global atrophy. No intracranial mass lesion noted on this unenhanced exam. Vascular: Vascular calcifications Skull: No skull fracture. Sinuses/Orbits: Post lens replacement. No acute orbital abnormality. Partial opacification left sphenoid sinus. Other: Mastoid air cells and middle ear cavities are clear. IMPRESSION: 1. Overall no significant change since exam performed earlier today although significant change compared to 05/07/2018. 2. Broad-based bilateral subdural hematomas measuring up to 13 mm on the right and 11 mm on left. Local mass effect without midline shift. 3. Focal blood collection anterior right middle cranial fossa measuring up to 11 mm. This may simply represent clotted blood but is immediately adjacent to the right middle cerebral artery bifurcation and thrombosed aneurysm cannot be entirely excluded. 4. Chronic microvascular changes. Global atrophy. 5. Partial opacification left sphenoid sinus. Electronically Signed   By: Genia Del M.D.   On: 06/15/2018 19:48   Ct Cervical Spine Wo Contrast  Addendum Date: 07/01/2018   ADDENDUM REPORT: 07/01/2018 16:28 ADDENDUM: Critical Value/emergent results were called by telephone at the time of interpretation on 07/01/2018 at 1624 hours to Dr. Fredia Sorrow , who verbally acknowledged these results. Electronically Signed   By: Genevie Ann M.D.   On: 07/01/2018 16:28   Result Date: 07/01/2018 CLINICAL DATA:  84 year old female status post polytrauma. Bilateral subdural hematomas since earlier this month. EXAM: CT HEAD WITHOUT CONTRAST CT CERVICAL SPINE WITHOUT  CONTRAST TECHNIQUE: Multidetector CT imaging of the head and cervical spine was performed following the standard protocol without intravenous contrast. Multiplanar CT image reconstructions of the cervical spine were also generated. COMPARISON:  06/15/2018 head CT and earlier, including cervical spine CT 05/07/2018. FINDINGS: CT HEAD FINDINGS Brain: Progressed right side mostly hypodense extra-axial collection and associated mass effect on the brain. The collection is now biconvex measuring up to 27 millimeters in thickness, with hyperdensity along the margins. See coronal image 29). There is associated leftward midline shift of 7 millimeters, new since 06/15/2018. At the same time the contralateral left mixed density subdural hematoma has decreased to 5-7 millimeters in thickness along the anterior left convexity. No intra-axial or intraventricular blood. Mass effect on the right lateral ventricle. Stable ventricle size otherwise. Basilar cisterns remain patent. No cortically based acute infarct identified. Vascular: Calcified atherosclerosis at the skull base. Skull: No skull fracture identified. Sinuses/Orbits: Small low-density fluid level in the left sphenoid sinus. Other Visualized paranasal sinuses and mastoids are stable and well pneumatized. Other: No acute orbit or scalp soft tissue findings. CT CERVICAL SPINE FINDINGS Alignment: Stable mild straightening. Cervicothoracic junction alignment is within  normal limits. Bilateral posterior element alignment is within normal limits. Skull base and vertebrae: Osteopenia. No acute cervical spine fracture. Soft tissues and spinal canal: No prevertebral fluid or swelling. No visible canal hematoma. Bulky left carotid calcified atherosclerosis again noted. Disc levels: Stable. Advanced mid and lower cervical disc and endplate degeneration. Upper chest: Left posterior 1st through 3rd rib fractures with some periosteal reaction. These are relatively nondisplaced.  Negative visible lung apices. Negative noncontrast thoracic inlet. Visible upper thoracic vertebral bodies appear to remain intact. IMPRESSION: 1. Progressed right side extra-axial collection since the subdural hematoma on 06/15/2018. This is now biconvex, measuring up to 27 mm in thickness, and associated with increased brain mass effect including 7 mm of leftward midline shift. 2. Contralateral left subdural hematoma has regressed, now 5-7 mm thickness. 3. Subacute appearing left posterior 1st through 3rd rib fractures with periosteal reaction. 4. Osteopenia.  No skull or cervical spine fracture identified. Electronically Signed: By: Genevie Ann M.D. On: 07/01/2018 16:20   US Renal  Result Date: 06/16/2018 CLINICAL DATA:  Acute kidney injury EXAM: RENAL / URINARY TRACT ULTRASOUND COMPLETE COMPARISON:  None. FINDINGS: Right Kidney: Renal measurements: 10.2 x 3.9 x 4.5 cm = volume: 91 mL. 2 cm lower pole cyst. Negative for hydronephrosis. Left Kidney: Renal measurements: 10.6 x 6.7 x 5.7 cm = volume: 212 mL. Renal size and volume measurement includes left renal cyst measuring 4 x 5 cm with layering debris. Negative for hydronephrosis. Bladder: Normal appearing bladder.  Bilateral ureteral jets. IMPRESSION: Normal renal size. Complex cyst left kidney 4 x 5 cm. Negative for hydronephrosis Electronically Signed   By: Franchot Gallo M.D.   On: 06/16/2018 10:31    Lab Data:  CBC: Recent Labs  Lab 07/01/18 1531 07/01/18 1541 07/03/18 0517 07/04/18 0525 07/05/18 0430  WBC 6.7  --  5.0 8.0 6.4  NEUTROABS 4.2  --   --   --   --   HGB 12.1 12.2 12.4 11.4* 11.5*  HCT 38.0 36.0 38.8 34.3* 34.9*  MCV 93.4  --  92.8 92.5 93.6  PLT 291  --  264 290 034   Basic Metabolic Panel: Recent Labs  Lab 07/01/18 1531 07/01/18 1541 07/02/18 0412 07/03/18 0517 07/04/18 0525 07/05/18 0430  NA 137 137 140 141 142 141  K 4.1 4.0 4.0 4.2 3.9 3.9  CL 98 100 101 105 109 108  CO2 26  --  27 21* 22 27  GLUCOSE 118*  114* 114* 182* 157* 148*  BUN 25* 27* 20 25* 27* 23  CREATININE 1.18* 1.20* 1.22* 1.11* 1.05* 1.00  CALCIUM 10.0  --  9.6 9.2 9.0 8.7*   GFR: Estimated Creatinine Clearance: 38.5 mL/min (by C-G formula based on SCr of 1 mg/dL). Liver Function Tests: Recent Labs  Lab 07/01/18 1531  AST 18  ALT 12  ALKPHOS 77  BILITOT 0.5  PROT 6.5  ALBUMIN 3.6   No results for input(s): LIPASE, AMYLASE in the last 168 hours. No results for input(s): AMMONIA in the last 168 hours. Coagulation Profile: Recent Labs  Lab 07/01/18 1531  INR 0.92   Cardiac Enzymes: No results for input(s): CKTOTAL, CKMB, CKMBINDEX, TROPONINI in the last 168 hours. BNP (last 3 results) No results for input(s): PROBNP in the last 8760 hours. HbA1C: No results for input(s): HGBA1C in the last 72 hours. CBG: Recent Labs  Lab 07/04/18 0615 07/04/18 1135 07/04/18 1630 07/04/18 2145 07/05/18 1054  GLUCAP 153* 165* 105* 144* 116*   Lipid  Profile: No results for input(s): CHOL, HDL, LDLCALC, TRIG, CHOLHDL, LDLDIRECT in the last 72 hours. Thyroid Function Tests: No results for input(s): TSH, T4TOTAL, FREET4, T3FREE, THYROIDAB in the last 72 hours. Anemia Panel: No results for input(s): VITAMINB12, FOLATE, FERRITIN, TIBC, IRON, RETICCTPCT in the last 72 hours. Urine analysis:    Component Value Date/Time   COLORURINE YELLOW 07/01/2018 1834   APPEARANCEUR CLEAR 07/01/2018 1834   LABSPEC <1.005 (L) 07/01/2018 1834   PHURINE 5.0 07/01/2018 1834   GLUCOSEU NEGATIVE 07/01/2018 1834   HGBUR NEGATIVE 07/01/2018 1834   BILIRUBINUR NEGATIVE 07/01/2018 1834   KETONESUR NEGATIVE 07/01/2018 1834   PROTEINUR NEGATIVE 07/01/2018 1834   NITRITE NEGATIVE 07/01/2018 1834   LEUKOCYTESUR TRACE (A) 07/01/2018 1834     Rushton Early M.D. [facility] 07/05/2018, 11:03 AM  Pager: [phone] Between 7am to 7pm - call Pager - [phone]  After 7pm go to [email or web address] - password [email or web address]  Call night coverage person  covering after 7pm

## 2018-07-06 DIAGNOSIS — E038 Other specified hypothyroidism: Secondary | ICD-10-CM

## 2018-07-06 LAB — GLUCOSE, CAPILLARY
GLUCOSE-CAPILLARY: 134 mg/dL — AB (ref 70–99)
Glucose-Capillary: 107 mg/dL — ABNORMAL HIGH (ref 70–99)
Glucose-Capillary: 142 mg/dL — ABNORMAL HIGH (ref 70–99)
Glucose-Capillary: 142 mg/dL — ABNORMAL HIGH (ref 70–99)

## 2018-07-06 MED ORDER — RESOURCE THICKENUP CLEAR PO POWD
ORAL | Status: DC | PRN
  Filled 2018-07-06: qty 125

## 2018-07-06 MED ORDER — ATORVASTATIN CALCIUM 10 MG PO TABS
20.0000 mg | ORAL_TABLET | Freq: Every day | ORAL | Status: DC
  Administered 2018-07-06 – 2018-07-09 (×4): 20 mg via ORAL
  Filled 2018-07-06 (×4): qty 2

## 2018-07-06 NOTE — Progress Notes (Signed)
Physical Therapy Treatment Patient Details Name: Bianca Wilson MRN: [id number] DOB: 09-15-1930 Today's Date: 07/06/2018    History of Present Illness Pt is an 84 y/o female admitted from SNF secondary to sustaining a fall. Of note, pt was hospitalized 2 weeks ago for a fall, at which time she had bilateral SDHs.  A new CTH was obtained since she was found down, which showed an expansion of the R SDH. PMH including but not limited to Alzheimer's.    PT Comments    Pt unable to focus on task unless using automatic tasks such as standing in the RW .  Pt showed as much as a 10 sec delay in answering a question.  Emphasis placed on transitions, sitting balance, sit to stand and transfer to a chair.    Follow Up Recommendations  SNF     Equipment Recommendations  None recommended by PT    Recommendations for Other Services       Precautions / Restrictions Precautions Precautions: Fall Precaution Comments: severe alzheimers    Mobility  Bed Mobility Overal bed mobility: Needs Assistance Bed Mobility: Supine to Sit     Supine to sit: Max assist;+2 for physical assistance     General bed mobility comments: pt assisted with bridging once set up and given time to follow v/t cues, still needed max assist.  truncal assist to come forward.  hand over hand assist for hand placement and attempts to get pt to assist scoot.  Transfers Overall transfer level: Needs assistance Equipment used: Rolling walker (2 wheeled);None Transfers: Sit to/from [facility] Sit to Stand: Min assist;Mod assist Stand pivot transfers: Mod assist;+2 physical assistance       General transfer comment: sit to stand into the RW was effective due to carry over from her typical use of the RW.  face to face used for transfer due to need for more contol.  Ambulation/Gait             General Gait Details: pt unable today   Stairs             Wheelchair Mobility    Modified  Rank[place] (Stroke Patients Only)       Balance Overall balance assessment: Needs assistance Sitting-balance support: Feet supported;No upper extremity supported;Single extremity supported;Bilateral upper extremity supported Sitting balance-Leahy Scale: Poor Sitting balance - Comments: listing forward and right     Standing balance-Leahy Scale: Poor Standing balance comment: stood well, but needed moderately heavy use of the RW                            Cognition Arousal/Alertness: Lethargic Behavior During Therapy: Flat affect Overall Cognitive Status: History of cognitive impairments - at baseline                                 General Comments: pt typically only oriented to self, decreased safety awareness, and insight to deficits      Exercises      General Comments        Pertinent Vitals/Pain Pain Assessment: No/denies pain Faces Pain Scale: No hurt    Home Living                      Prior Function            PT Goals (current goals can now be found in  the care plan section) Acute Rehab PT Goals Patient Stated Goal: pt unable to state secondary to lethargy PT Goal Formulation: With family Time For Goal Achievement: 07/16/18 Potential to Achieve Goals: Good Progress towards PT goals: Progressing toward goals    Frequency    Min 2X/week      PT Plan Current plan remains appropriate    Co-evaluation              AM-PAC PT "6 Clicks" Mobility   Outcome Measure  Help needed turning from your back to your side while in a flat bed without using bedrails?: A Lot Help needed moving from lying on your back to sitting on the side of a flat bed without using bedrails?: A Lot Help needed moving to and from a bed to a chair (including a wheelchair)?: A Lot Help needed standing up from a chair using your arms (e.g., wheelchair or bedside chair)?: A Lot Help needed to walk in hospital room?: Total Help needed climbing  3-5 steps with a railing? : Total 6 Click Score: 10    End of Session   Activity Tolerance: Patient limited by lethargy;Patient tolerated treatment well Patient left: in chair;with call bell/phone within reach;with chair alarm set;with family/visitor present Nurse Communication: Mobility status PT Visit Diagnosis: Other abnormalities of gait and mobility (R26.89);Muscle weakness (generalized) (M62.81);Other symptoms and signs involving the nervous system (R29.898)     Time: 4801-6553 PT Time Calculation (min) (ACUTE ONLY): 33 min  Charges:  $Therapeutic Activity: 23-37 mins                     07/06/2018  Donnella Sham, PT Acute Rehabilitation Services [phone]  (pager) [phone]  (office)   Tessie Fass Trinitee Horgan 07/06/2018, 1:45 PM

## 2018-07-06 NOTE — Progress Notes (Signed)
Daily Progress Note   Patient Name: Bianca Wilson       Date: 07/06/2018 DOB: 1930-11-15  Age: 84 y.o. MRN#: [id number] Attending Physician: Mendel Corning, MD Primary Care Physician: Bianca Number, MD Admit Date: 07/01/2018  Reason for Consultation/Follow-up: Establishing goals of care  Subjective: Patient lethargic - responsive after physical stimulation, but gives one word answer and falls back asleep  Length of Stay: 5  Current Medications: Scheduled Meds:  . amLODipine  10 mg Oral Daily  . atorvastatin  20 mg Oral Daily  . insulin aspart  0-9 Units Subcutaneous TID WC  . levothyroxine  25 mcg Oral Q0600  . pneumococcal 23 valent vaccine  0.5 mL Intramuscular Tomorrow-1000    Continuous Infusions: . sodium chloride Stopped (07/06/18 0920)  . levETIRAcetam 400 mL/hr at 07/06/18 0921    PRN Meds: acetaminophen, morphine injection, RESOURCE THICKENUP CLEAR, white petrolatum  Physical Exam         Constitutional:      General: She is not in acute distress.    Appearance: She is normal weight.  Cardiovascular:     Rate and Rhythm: Normal rate and regular rhythm.  Pulmonary:     Effort: Pulmonary effort is normal.     Breath sounds: Normal breath sounds.  Abdominal:     Palpations: Abdomen is soft.  Musculoskeletal:     Right lower leg: No edema.     Left lower leg: No edema.  Skin:    General: Skin is warm and dry.  Neurological:     Mental Status: She is lethargic.   Vital Signs: BP (!) 145/70 (BP Location: Right Arm)   Pulse (!) 57   Temp 97.6 F (36.4 C) (Oral)   Resp 16   Ht 5\' 2"  (1.575 m)   Wt 73 kg   SpO2 99%   BMI 29.44 kg/m  SpO2: SpO2: 99 % O2 Device: O2 Device: Room Air O2 Flow Rate:    Intake/output summary:   Intake/Output Summary (Last 24 hours) at  07/06/2018 1308 Last data filed at 07/06/2018 1829 Gross per 24 hour  Intake 915.65 ml  Output -  Net 915.65 ml   LBM: Last BM Date: 07/05/18 Baseline Weight: Weight: 73 kg Most recent weight: Weight: 73 kg       Palliative Assessment/Data: PPS 20%    Flowsheet Rows     Most Recent Value  Intake Tab  Referral Department  Hospitalist  Unit at Time of Referral  Intermediate Care Unit  Palliative Care Primary Diagnosis  Neurology  Date Notified  07/02/18  Palliative Care Type  New Palliative care  Reason for referral  Clarify Goals of Care  Date of Admission  07/01/18  Date first seen by Palliative Care  07/02/18  # of days Palliative referral response time  0 Day(s)  # of days IP prior to Palliative referral  1  Clinical Assessment  Palliative Performance Scale Score  10%  Psychosocial & Spiritual Assessment  Palliative Care Outcomes  Patient/Family meeting held?  Yes  Who was at the meeting?  daughter  [place] regarding hospice, Provided psychosocial or spiritual support      Patient Active  Problem List   Diagnosis Date Noted  . Goals of care, counseling/discussion   . Palliative care by specialist   . HTN (hypertension) 06/16/2018  . AKI (acute kidney injury) ([place]) 06/16/2018  . Alzheimer's dementia ([place]) 06/16/2018  . At risk for falls 06/16/2018  . Depression 06/16/2018  . HLD (hyperlipidemia) 06/16/2018  . Type 2 diabetes mellitus ([place]) 06/16/2018  . Hypothyroidism 06/16/2018  . Subdural hematoma ([facility]) 06/15/2018    Palliative Care Assessment & Plan   HPI: 84 y.o. female  with past medical history of hypothyroidism, HLD, T2DM, and dementia admitted on 07/01/2018 with left sided weakness after a fall. Of note, pt was hospitalized two weeks ago with a fall and bilateral subdural hematomas. Apparently, family declined surgery at that time. Patient has been living at [facility]. Head CT revealed progression of R subdural hematoma  compared to CT on 12/3. Per neurosurgery note, recommendation to maximize non-surgical treatment d/t her age, dementia, and frailty. PMT consulted for Derma.   Assessment: Follow up with patient and daughter. During my assessment, patient quite lethargic - wakes up with excessive physical stimulation and then quickly falls back asleep - answers questions with one word answers. Daughter tells me she was more alert this morning and had a few sips and bites of breakfast. Tells me intake has been minimal - sips and small bites.   Daughter wants to continue current care - waiting to see if surgery is recommended but at this point is hopeful she will not need it.   She is agreeable to rehab if recommended.  Emotional support provided and all questions addressed.   Recommendations/Plan:  Continue current care - family agreeable to whatever neurosurgery recommends - agreeable to rehab  PMT will continue to follow  Goals of Care and Additional Recommendations:  Limitations on Scope of Treatment: Full Scope Treatment  Code Status:  Full code  Prognosis:   Unable to determine  Discharge Planning:  To Be Determined  Care plan was discussed with patient's daughter  Thank you for allowing the Palliative Medicine Team to assist in the care of this patient.   Total Time 35 minutes Prolonged Time Billed  no       Greater than 50%  of this time was spent counseling and coordinating care related to the above assessment and plan.  Juel Burrow, DNP, [facility] Palliative Medicine Team Team Phone # [phone]  Pager [phone]

## 2018-07-06 NOTE — Progress Notes (Signed)
[facility]                                                                              Patient Demographics  Bianca Wilson, is a 84 y.o. female, DOB - [birth year], DXI:[id number]  Admit date - 07/01/2018   Admitting Physician Bianca Shell, MD  Outpatient Primary MD for the patient is Bianca Number, MD  Outpatient specialists:   LOS - 5  days   Medical records reviewed and are as summarized below:    Chief Complaint  Patient presents with  . Cerebrovascular Accident  . Weakness       Brief summary  Per Dr. Wilber Oliphant Wilson is a 84 y.o. female with medical history significant of Alzheimer's dementia, recent hospital admission for bilateral subdural hematoma patient was admitted 06/16/2018 and discharged the same day as family did not want surgery so she was discharged back to assisted living facility.  She was asked to resume aspirin 2 weeks after discharge.  She had a fall on the morning of admission and the staff noticed that she was dragging her left foot and left leg and she was sent to the ER.  She is not on any blood thinners at this time.  Head showed progression of right subdural hematoma compared to the CT on 12/3.  Neurosurgery was consulted    Assessment & Plan       Subdural hematoma ([place]) bilateral, right worse than left, with the left-sided weakness -Patient was recently admitted from 12/3 to 12/4 with fall and bilateral subdural hematomas.  She also has a history of advanced dementia.  Neurosurgery was consulted, however patient's family did not want any surgery, patient was discharged to skilled nursing facility. -Neurosurgery was consulted, recommended observation, EEG and antiseizure medication.  Patient was started on Keppra. -Repeat CT head showed stable SDH, neuro recommended continue Keppra and observe over the weekend as patient's daughter interested in burr hole surgery if it is recommended and possible by neurosurgery.  -  EEG showed moderate generalized slowing but no seizures at the time. -Repeat CT head on 12/23 showed slightly improved large right-sided subdural hematoma -Dr. Trenton Wilson recommended continued observation and Dr. Ellene Wilson will reassess on Wednesday in regards to need for surgery -Patient is much more alert and awake, still has left-sided weakness.  Mental status close to her baseline per grandson at the bedside.  Acute metabolic encephalopathy superimposed on dementia, worsened due to subdural hematoma -Likely due to #1, continue Keppra, received IV Decadron 4 doses    Acute kidney injury -Resolved, creatinine 1.0  Alzheimer's dementia -Per family, she is in currently dementia unit and has been progressively getting worse, takes Aricept -Palliative medicine has been consulted for goals of care, however plans on hold until decision for surgery  Hyperlipidemia Restarted Lipitor.  Diabetes mellitus type 2 Continue sliding scale insulin, hold metformin  Hypertension -Currently stable  Hypothyroidism Continue Synthroid  Dysphagia Continue dysphagia 1 diet  Code Status: Full CODE STATUS DVT Prophylaxis:  SCD's Family Communication: Discussed in detail with the patient, all imaging results, lab results explained to the patient's grandson in the room  Disposition Plan: Awaiting neurosurgery recommendations  Time Spent in minutes 25 minutes  Procedures:  CT head  Consultants:   Neurosurgery Neurology Palliative medicine  Antimicrobials:      Medications  Scheduled Meds: . amLODipine  10 mg Oral Daily  . insulin aspart  0-9 Units Subcutaneous TID WC  . levothyroxine  25 mcg Oral Q0600  . pneumococcal 23 valent vaccine  0.5 mL Intramuscular Tomorrow-1000   Continuous Infusions: . sodium chloride Stopped (07/06/18 0920)  . levETIRAcetam 400 mL/hr at 07/06/18 0921   PRN Meds:.   Antibiotics   Anti-infectives (From admission, onward)   None        Subjective:     Bianca Wilson was seen and examined today.  Alert and awake, close to mental status per grandson at the bedside, no acute issues overnight.  Still left-sided weakness persisting.  No fever chills, dizziness, nausea vomiting.  Objective:   Vitals:   07/05/18 2019 07/06/18 0028 07/06/18 0431 07/06/18 0936  BP: (!) 148/43 (!) 157/57 (!) 157/64 (!) 145/70  Pulse: (!) 52 (!) 54 (!) 56 (!) 57  Resp: 18  18 16   Temp: 97.9 F (36.6 C) 98 F (36.7 C) 98.2 F (36.8 C) 97.6 F (36.4 C)  TempSrc: Oral Oral Oral Oral  SpO2: 97% 96% 94% 99%  Weight:      Height:        Intake/Output Summary (Last 24 hours) at 07/06/2018 1135 Last data filed at 07/06/2018 7425 Gross per 24 hour  Intake 975.65 ml  Output -  Net 975.65 ml     Wt Readings from Last 3 Encounters:  07/01/18 73 kg  06/15/18 73.2 kg     Physical Exam  General: Alert and awake, NAD  Eyes:   HEENT:    Cardiovascular: S1 S2 auscultated, RRR No pedal edema b/l  Respiratory: Clear to auscultation bilaterally, no wheezing, rales or rhonchi  Gastrointestinal: Soft, nontender, nondistended, + bowel sounds  Ext: no pedal edema bilaterally  Neuro: Left-sided weakness 1/5  Musculoskeletal: No digital cyanosis, clubbing  Skin: No rashes  Psych: Flat affect   Data Reviewed:  I have personally reviewed following labs and imaging studies  Micro Results No results found for this or any previous visit (from the past 240 hour(s)).  Radiology Reports Ct Head Wo Contrast  Result Date: 07/05/2018 CLINICAL DATA:  84 year old female with intracranial hemorrhage. Follow-up. Alzheimer's. Subsequent encounter. EXAM: CT HEAD WITHOUT CONTRAST TECHNIQUE: Contiguous axial images were obtained from the base of the skull through the vertex without intravenous contrast. COMPARISON:  Several prior exams most recent 07/02/2018. FINDINGS: Brain: Large right-sided subdural collection measuring approximately 12.5 x 3 x 7.3 cm with  compression of the right lateral ventricle causing 7 mm midline shift to the right and downward displacement right lateral ventricle relatively similar to prior exam. Similar appearance of broad-based left-sided subdural collection with maximal thickness of 7 mm without significant change. No new area of intracranial hemorrhage. Chronic microvascular changes without CT evidence of large acute infarct. Atrophy. No intracranial mass lesion noted on this unenhanced exam. Vascular: Vascular calcifications.  No acute hyperdense vessel. Skull: No acute skull fracture. Sinuses/Orbits: Post lens replacement. No acute orbital abnormality. Partial opacification left sphenoid sinus. Other: Mastoid air cells and middle ear cavities are clear. IMPRESSION: 1. Relatively similar appearance to prior exam. 2. Large right-sided subdural collection measuring approximately 12.5 x 3 x 7.3 cm with compression of the right lateral ventricle causing 7 mm midline shift to the right  and downward displacement right lateral ventricle relatively similar to prior exam. 3. Similar appearance of broad-based left-sided subdural collection with maximal thickness of 7 mm. 4. No new area of intracranial hemorrhage. 5. Chronic microvascular changes. 6. Atrophy. Electronically Signed   By: Genia Del M.D.   On: 07/05/2018 08:08   Ct Head Wo Contrast  Result Date: 07/02/2018 CLINICAL DATA:  Subdural hematomas. Increased lethargy. EXAM: CT HEAD WITHOUT CONTRAST TECHNIQUE: Contiguous axial images were obtained from the base of the skull through the vertex without intravenous contrast. COMPARISON:  07/01/2018 FINDINGS: Brain: Large subdural hematoma over the right cerebral convexity of mixed though predominantly low density is unchanged in size measuring up to 30 mm in thickness with unchanged mass effect including 7 mm of leftward midline shift. Mixed density subdural hematoma over the left cerebral convexity is also unchanged in size measuring 7  mm in thickness. No new intracranial hemorrhage or acute large territory infarct is identified. The ventricles are unchanged in size. Patchy cerebral white matter hypodensities are unchanged and nonspecific but compatible with mild chronic small vessel ischemic disease. The basilar cisterns are patent. Vascular: Calcified atherosclerosis at the skull base. No hyperdense vessel. Skull: No fracture or focal osseous lesion. Sinuses/Orbits: Unchanged small volume left sphenoid sinus fluid. Clear mastoid air cells. Bilateral cataract extraction. Other: None. IMPRESSION: 1. Unchanged large right and small left left subdural hematomas. Unchanged 7 mm leftward midline shift. 2. No new intracranial abnormality. Electronically Signed   By: Logan Bores M.D.   On: 07/02/2018 13:40   Ct Head Wo Contrast  Addendum Date: 07/01/2018   ADDENDUM REPORT: 07/01/2018 16:28 ADDENDUM: Critical Value/emergent results were called by telephone at the time of interpretation on 07/01/2018 at 1624 hours to Dr. Fredia Sorrow , who verbally acknowledged these results. Electronically Signed   By: Genevie Ann M.D.   On: 07/01/2018 16:28   Result Date: 07/01/2018 CLINICAL DATA:  84 year old female status post polytrauma. Bilateral subdural hematomas since earlier this month. EXAM: CT HEAD WITHOUT CONTRAST CT CERVICAL SPINE WITHOUT CONTRAST TECHNIQUE: Multidetector CT imaging of the head and cervical spine was performed following the standard protocol without intravenous contrast. Multiplanar CT image reconstructions of the cervical spine were also generated. COMPARISON:  06/15/2018 head CT and earlier, including cervical spine CT 05/07/2018. FINDINGS: CT HEAD FINDINGS Brain: Progressed right side mostly hypodense extra-axial collection and associated mass effect on the brain. The collection is now biconvex measuring up to 27 millimeters in thickness, with hyperdensity along the margins. See coronal image 29). There is associated leftward  midline shift of 7 millimeters, new since 06/15/2018. At the same time the contralateral left mixed density subdural hematoma has decreased to 5-7 millimeters in thickness along the anterior left convexity. No intra-axial or intraventricular blood. Mass effect on the right lateral ventricle. Stable ventricle size otherwise. Basilar cisterns remain patent. No cortically based acute infarct identified. Vascular: Calcified atherosclerosis at the skull base. Skull: No skull fracture identified. Sinuses/Orbits: Small low-density fluid level in the left sphenoid sinus. Other Visualized paranasal sinuses and mastoids are stable and well pneumatized. Other: No acute orbit or scalp soft tissue findings. CT CERVICAL SPINE FINDINGS Alignment: Stable mild straightening. Cervicothoracic junction alignment is within normal limits. Bilateral posterior element alignment is within normal limits. Skull base and vertebrae: Osteopenia. No acute cervical spine fracture. Soft tissues and spinal canal: No prevertebral fluid or swelling. No visible canal hematoma. Bulky left carotid calcified atherosclerosis again noted. Disc levels: Stable. Advanced mid and lower cervical disc  and endplate degeneration. Upper chest: Left posterior 1st through 3rd rib fractures with some periosteal reaction. These are relatively nondisplaced. Negative visible lung apices. Negative noncontrast thoracic inlet. Visible upper thoracic vertebral bodies appear to remain intact. IMPRESSION: 1. Progressed right side extra-axial collection since the subdural hematoma on 06/15/2018. This is now biconvex, measuring up to 27 mm in thickness, and associated with increased brain mass effect including 7 mm of leftward midline shift. 2. Contralateral left subdural hematoma has regressed, now 5-7 mm thickness. 3. Subacute appearing left posterior 1st through 3rd rib fractures with periosteal reaction. 4. Osteopenia.  No skull or cervical spine fracture identified.  Electronically Signed: By: Genevie Ann M.D. On: 07/01/2018 16:20   Ct Head Wo Contrast  Result Date: 06/15/2018 CLINICAL DATA:  84 year old female with intracranial bleed. Alzheimer's. Subsequent encounter. EXAM: CT HEAD WITHOUT CONTRAST TECHNIQUE: Contiguous axial images were obtained from the base of the skull through the vertex without intravenous contrast. COMPARISON:  06/15/2018 2:02 p.m.  05/07/2018. FINDINGS: Brain: Broad-based bilateral subdural hematomas measuring up to 13 mm on the right and 11 mm on left. Local mass effect without midline shift. Focal blood anterior right middle cranial fossa measuring up to 11 mm. Chronic microvascular changes. Global atrophy. No intracranial mass lesion noted on this unenhanced exam. Vascular: Vascular calcifications Skull: No skull fracture. Sinuses/Orbits: Post lens replacement. No acute orbital abnormality. Partial opacification left sphenoid sinus. Other: Mastoid air cells and middle ear cavities are clear. IMPRESSION: 1. Overall no significant change since exam performed earlier today although significant change compared to 05/07/2018. 2. Broad-based bilateral subdural hematomas measuring up to 13 mm on the right and 11 mm on left. Local mass effect without midline shift. 3. Focal blood collection anterior right middle cranial fossa measuring up to 11 mm. This may simply represent clotted blood but is immediately adjacent to the right middle cerebral artery bifurcation and thrombosed aneurysm cannot be entirely excluded. 4. Chronic microvascular changes. Global atrophy. 5. Partial opacification left sphenoid sinus. Electronically Signed   By: Genia Del M.D.   On: 06/15/2018 19:48   Ct Cervical Spine Wo Contrast  Addendum Date: 07/01/2018   ADDENDUM REPORT: 07/01/2018 16:28 ADDENDUM: Critical Value/emergent results were called by telephone at the time of interpretation on 07/01/2018 at 1624 hours to Dr. Fredia Sorrow , who verbally acknowledged these  results. Electronically Signed   By: Genevie Ann M.D.   On: 07/01/2018 16:28   Result Date: 07/01/2018 CLINICAL DATA:  84 year old female status post polytrauma. Bilateral subdural hematomas since earlier this month. EXAM: CT HEAD WITHOUT CONTRAST CT CERVICAL SPINE WITHOUT CONTRAST TECHNIQUE: Multidetector CT imaging of the head and cervical spine was performed following the standard protocol without intravenous contrast. Multiplanar CT image reconstructions of the cervical spine were also generated. COMPARISON:  06/15/2018 head CT and earlier, including cervical spine CT 05/07/2018. FINDINGS: CT HEAD FINDINGS Brain: Progressed right side mostly hypodense extra-axial collection and associated mass effect on the brain. The collection is now biconvex measuring up to 27 millimeters in thickness, with hyperdensity along the margins. See coronal image 29). There is associated leftward midline shift of 7 millimeters, new since 06/15/2018. At the same time the contralateral left mixed density subdural hematoma has decreased to 5-7 millimeters in thickness along the anterior left convexity. No intra-axial or intraventricular blood. Mass effect on the right lateral ventricle. Stable ventricle size otherwise. Basilar cisterns remain patent. No cortically based acute infarct identified. Vascular: Calcified atherosclerosis at the skull base. Skull: No skull  fracture identified. Sinuses/Orbits: Small low-density fluid level in the left sphenoid sinus. Other Visualized paranasal sinuses and mastoids are stable and well pneumatized. Other: No acute orbit or scalp soft tissue findings. CT CERVICAL SPINE FINDINGS Alignment: Stable mild straightening. Cervicothoracic junction alignment is within normal limits. Bilateral posterior element alignment is within normal limits. Skull base and vertebrae: Osteopenia. No acute cervical spine fracture. Soft tissues and spinal canal: No prevertebral fluid or swelling. No visible canal hematoma.  Bulky left carotid calcified atherosclerosis again noted. Disc levels: Stable. Advanced mid and lower cervical disc and endplate degeneration. Upper chest: Left posterior 1st through 3rd rib fractures with some periosteal reaction. These are relatively nondisplaced. Negative visible lung apices. Negative noncontrast thoracic inlet. Visible upper thoracic vertebral bodies appear to remain intact. IMPRESSION: 1. Progressed right side extra-axial collection since the subdural hematoma on 06/15/2018. This is now biconvex, measuring up to 27 mm in thickness, and associated with increased brain mass effect including 7 mm of leftward midline shift. 2. Contralateral left subdural hematoma has regressed, now 5-7 mm thickness. 3. Subacute appearing left posterior 1st through 3rd rib fractures with periosteal reaction. 4. Osteopenia.  No skull or cervical spine fracture identified. Electronically Signed: By: Genevie Ann M.D. On: 07/01/2018 16:20   US Renal  Result Date: 06/16/2018 CLINICAL DATA:  Acute kidney injury EXAM: RENAL / URINARY TRACT ULTRASOUND COMPLETE COMPARISON:  None. FINDINGS: Right Kidney: Renal measurements: 10.2 x 3.9 x 4.5 cm = volume: 91 mL. 2 cm lower pole cyst. Negative for hydronephrosis. Left Kidney: Renal measurements: 10.6 x 6.7 x 5.7 cm = volume: 212 mL. Renal size and volume measurement includes left renal cyst measuring 4 x 5 cm with layering debris. Negative for hydronephrosis. Bladder: Normal appearing bladder.  Bilateral ureteral jets. IMPRESSION: Normal renal size. Complex cyst left kidney 4 x 5 cm. Negative for hydronephrosis Electronically Signed   By: Franchot Gallo M.D.   On: 06/16/2018 10:31    Lab Data:  CBC: Recent Labs  Lab 07/01/18 1531 07/01/18 1541 07/03/18 0517 07/04/18 0525 07/05/18 0430  WBC 6.7  --  5.0 8.0 6.4  NEUTROABS 4.2  --   --   --   --   HGB 12.1 12.2 12.4 11.4* 11.5*  HCT 38.0 36.0 38.8 34.3* 34.9*  MCV 93.4  --  92.8 92.5 93.6  PLT 291  --  264 290  423   Basic Metabolic Panel: Recent Labs  Lab 07/01/18 1531 07/01/18 1541 07/02/18 0412 07/03/18 0517 07/04/18 0525 07/05/18 0430  NA 137 137 140 141 142 141  K 4.1 4.0 4.0 4.2 3.9 3.9  CL 98 100 101 105 109 108  CO2 26  --  27 21* 22 27  GLUCOSE 118* 114* 114* 182* 157* 148*  BUN 25* 27* 20 25* 27* 23  CREATININE 1.18* 1.20* 1.22* 1.11* 1.05* 1.00  CALCIUM 10.0  --  9.6 9.2 9.0 8.7*   GFR: Estimated Creatinine Clearance: 38.5 mL/min (by C-G formula based on SCr of 1 mg/dL). Liver Function Tests: Recent Labs  Lab 07/01/18 1531  AST 18  ALT 12  ALKPHOS 77  BILITOT 0.5  PROT 6.5  ALBUMIN 3.6   No results for input(s): LIPASE, AMYLASE in the last 168 hours. No results for input(s): AMMONIA in the last 168 hours. Coagulation Profile: Recent Labs  Lab 07/01/18 1531  INR 0.92   Cardiac Enzymes: No results for input(s): CKTOTAL, CKMB, CKMBINDEX, TROPONINI in the last 168 hours. BNP (last 3 results) No  results for input(s): PROBNP in the last 8760 hours. HbA1C: No results for input(s): HGBA1C in the last 72 hours. CBG: Recent Labs  Lab 07/05/18 0703 07/05/18 1054 07/05/18 1644 07/05/18 2114 07/06/18 0617  GLUCAP 145* 116* 134* 87 134*   Lipid Profile: No results for input(s): CHOL, HDL, LDLCALC, TRIG, CHOLHDL, LDLDIRECT in the last 72 hours. Thyroid Function Tests: No results for input(s): TSH, T4TOTAL, FREET4, T3FREE, THYROIDAB in the last 72 hours. Anemia Panel: No results for input(s): VITAMINB12, FOLATE, FERRITIN, TIBC, IRON, RETICCTPCT in the last 72 hours. Urine analysis:    Component Value Date/Time   COLORURINE YELLOW 07/01/2018 1834   APPEARANCEUR CLEAR 07/01/2018 1834   LABSPEC <1.005 (L) 07/01/2018 1834   PHURINE 5.0 07/01/2018 1834   GLUCOSEU NEGATIVE 07/01/2018 1834   HGBUR NEGATIVE 07/01/2018 1834   BILIRUBINUR NEGATIVE 07/01/2018 1834   KETONESUR NEGATIVE 07/01/2018 1834   PROTEINUR NEGATIVE 07/01/2018 1834   NITRITE NEGATIVE  07/01/2018 1834   LEUKOCYTESUR TRACE (A) 07/01/2018 1834     Oneisha Ammons M.D. [facility] 07/06/2018, 11:35 AM  Pager: [phone] Between 7am to 7pm - call Pager - [phone]  After 7pm go to [email or web address] - password [email or web address]  Call night coverage person covering after 7pm

## 2018-07-06 NOTE — Progress Notes (Signed)
No new issues or problems overnight.  Patient somewhat more interactive with her daughter this morning.  Patient with significant right sided subdural hematoma.  Follow-up scan yesterday slightly improved.  Plan currently is to continue observation.  Dr. Ellene Route to reassess on Wednesday with regard to need for surgery.

## 2018-07-06 NOTE — Progress Notes (Signed)
  Speech Language Pathology Treatment: Dysphagia  Patient Details Name: Bianca Wilson MRN: [id number] DOB: 07/04/1931 Today's Date: 07/06/2018 Time: 0830-0909 SLP Time Calculation (min) (ACUTE ONLY): 39 min  Assessment / Plan / Recommendation Clinical Impression  Pt being fed by daughter upon entrance to room.  Pt is a laborious feeder due to her cognitive based oral holding requiring extended time and frequent cueing to swallow.  Total cues to clear oral cavity before placing more in mouth - daughter reports this is baseline.    Suspect premature spillage of liquid into larynx before swallow with tsp of thin and straw - resulting in pt overtly coughing before the swallow.  Use of thickener in drink helpful to decrease aspiration risk at least temporarily.   Hand-over-hand assist helps with motor planning and pt able to feed herself with plate placed within her reach.  Total cues to bolus control.      HPI HPI: 84 y.o. female with medical history significant for Alzheimer's dementia, recent hospital admission for bilateral subdural hematoma patient was admitted 06/16/2018 and discharged the same day as family did not want surgery so she was discharged back to assisted living facility.  Brought to ED with concern for CVA, fall. CT of the head 07/01/18 shows progression of right subdural hematoma compared to 06/15/2018.  This measures up to 27 mm in thickness associated with increased brain mass-effect including 7 mm leftward midline shift.  Contralateral left subdural hematoma has regressed to 5 to 7 mm thickness      SLP Plan  Continue with current plan of care       Recommendations  Diet recommendations: Dysphagia 1 (puree);Nectar-thick liquid(free water between meals after mouth care) Liquids provided via: Cup;No straw Medication Administration: Crushed with puree Compensations: Minimize environmental distractions;Slow rate;Small sips/bites;Lingual sweep for clearance of pocketing(cue to  swallow, clean mouth after meals, use applesauce to help orally transit thicker purees) Postural Changes and/or Swallow Maneuvers: Seated upright 90 degrees;Upright 30-60 min after meal                Oral Care Recommendations: Oral care BID(oral care after every meal) Follow up Recommendations: Skilled Nursing facility SLP Visit Diagnosis: Dysphagia, unspecified (R13.10) Plan: Continue with current plan of care       [place], [facility] SLP [place] Pager [phone] Office [phone]  Macario Golds 07/06/2018, 9:17 AM

## 2018-07-07 LAB — GLUCOSE, CAPILLARY
Glucose-Capillary: 137 mg/dL — ABNORMAL HIGH (ref 70–99)
Glucose-Capillary: 78 mg/dL (ref 70–99)
Glucose-Capillary: 117 mg/dL — ABNORMAL HIGH (ref 70–99)
Glucose-Capillary: 119 mg/dL — ABNORMAL HIGH (ref 70–99)

## 2018-07-07 NOTE — Progress Notes (Signed)
Patient ID: Bianca Wilson, female   DOB: [birth year], 84 y.o.   MRN: [id number] Vital signs are stable Patient was more arousable yesterday according to daughter She seems somewhat tired today and has not taken any breakfast Daughter is awaiting further consideration regarding surgery CT is planned for the a.m. and I have ordered the study Dr. Saintclair Halsted will be discussing surgery with patient's family

## 2018-07-07 NOTE — Progress Notes (Signed)
[facility]                                                                              Patient Demographics  Bianca Wilson, is a 84 y.o. female, DOB - 03/26/1931, PZW:[id number]  Admit date - 07/01/2018   Admitting Physician Georgette Shell, MD  Outpatient Primary MD for the patient is Raelyn Number, MD  Outpatient specialists:   LOS - 6  days   Medical records reviewed and are as summarized below:    Chief Complaint  Patient presents with  . Cerebrovascular Accident  . Weakness       Brief summary  Per Dr. Wilber Oliphant Bhargava is a 84 y.o. female with medical history significant of Alzheimer's dementia, recent hospital admission for bilateral subdural hematoma patient was admitted 06/16/2018 and discharged the same day as family did not want surgery so she was discharged back to assisted living facility.  She was asked to resume aspirin 2 weeks after discharge.  She had a fall on the morning of admission and the staff noticed that she was dragging her left foot and left leg and she was sent to the ER.  She is not on any blood thinners at this time.  Head showed progression of right subdural hematoma compared to the CT on 12/3.  Neurosurgery was consulted    Assessment & Plan       Subdural hematoma ([name]) bilateral, right worse than left, with the left-sided weakness -Patient was recently admitted from 12/3 to 12/4 with fall and bilateral subdural hematomas.  She also has a history of advanced dementia.  Neurosurgery was consulted, however patient's family did not want any surgery, patient was discharged to skilled nursing facility. -Neurosurgery was consulted, recommended observation, EEG and antiseizure medication.  Patient was started on Keppra. -Repeat CT head showed stable SDH, neuro recommended continue Keppra and observe over the weekend as patient's daughter interested in burr hole surgery if it is recommended and possible by neurosurgery.  -  EEG showed moderate generalized slowing but no seizures at the time. -Repeat CT head on 12/23 showed slightly improved large right-sided subdural hematoma -Dr. Trenton Gammon recommended continued observation and Dr. Ellene Route will reassess on Wednesday in regards to need for surgery Left-sided weakness persisting, somewhat more lethargic and somnolent today.  Per neurosurgery, CT head planned for 12/26 and then decision regarding surgery per Dr. Saintclair Halsted  Acute metabolic encephalopathy superimposed on dementia, worsened due to subdural hematoma -Likely due to #1, continue Keppra, received IV Decadron 4 doses    Acute kidney injury -Resolved, creatinine 1.0 -Recheck labs in a.m.  Alzheimer's dementia -Per family, she is in currently dementia unit and has been progressively getting worse, takes Aricept -Palliative medicine has been consulted for goals of care, however plans on hold until decision for surgery  Hyperlipidemia Restarted Lipitor.  Diabetes mellitus type 2 CBGs controlled, continue sliding scale insulin, hold metformin  Hypertension -Currently stable  Hypothyroidism Continue Synthroid  Dysphagia Continue dysphagia 1 diet  Code Status: Full CODE STATUS DVT Prophylaxis:  SCD's Family Communication: No family member present in the room today, discussed yesterday on 12/24  Disposition Plan: Awaiting neurosurgery recommendations  Time Spent in minutes 15 minutes  Procedures:  CT head  Consultants:   Neurosurgery Neurology Palliative medicine  Antimicrobials:      Medications  Scheduled Meds: . amLODipine  10 mg Oral Daily  . atorvastatin  20 mg Oral Daily  . insulin aspart  0-9 Units Subcutaneous TID WC  . levothyroxine  25 mcg Oral Q0600  . pneumococcal 23 valent vaccine  0.5 mL Intramuscular Tomorrow-1000   Continuous Infusions: . sodium chloride 50 mL/hr at 07/07/18 0615  . levETIRAcetam 1,000 mg (07/07/18 0951)   PRN Meds:.   Antibiotics    Anti-infectives (From admission, onward)   None        Subjective:   Bianca Wilson was seen and examined today.  Somewhat more somnolent today, opens eyes.  Left-sided weakness persisting.   No fever chills, dizziness, nausea vomiting.  Objective:   Vitals:   07/06/18 2349 07/07/18 0323 07/07/18 0731 07/07/18 1136  BP: (!) 158/58 (!) 152/62 (!) 147/69 (!) 145/65  Pulse: 70 75 71 71  Resp: 18 20 18 17   Temp: (!) 97.4 F (36.3 C) 98.2 F (36.8 C) 98 F (36.7 C) 98.1 F (36.7 C)  TempSrc: Oral Oral Oral Oral  SpO2: 98% 95% 94% 99%  Weight:      Height:        Intake/Output Summary (Last 24 hours) at 07/07/2018 1217 Last data filed at 07/07/2018 1000 Gross per 24 hour  Intake 884.76 ml  Output 1150 ml  Net -265.24 ml     Wt Readings from Last 3 Encounters:  07/01/18 73 kg  06/15/18 73.2 kg   Physical Exam  General: Somnolent, opens eyes  Eyes  HEENT:   Cardiovascular: S1 S2 auscultated, RRR, no pedal edema bilaterally   Respiratory: CTA anteriorly  Gastrointestinal: Soft, nontender, nondistended, + bowel sounds  Ext: no pedal edema bilaterally  Neuro: Left-sided weakness  Musculoskeletal: No digital cyanosis, clubbing  Skin: No rashes  Psych: Somnolent    Data Reviewed:  I have personally reviewed following labs and imaging studies  Micro Results No results found for this or any previous visit (from the past 240 hour(s)).  Radiology Reports Ct Head Wo Contrast  Result Date: 07/05/2018 CLINICAL DATA:  84 year old female with intracranial hemorrhage. Follow-up. Alzheimer's. Subsequent encounter. EXAM: CT HEAD WITHOUT CONTRAST TECHNIQUE: Contiguous axial images were obtained from the base of the skull through the vertex without intravenous contrast. COMPARISON:  Several prior exams most recent 07/02/2018. FINDINGS: Brain: Large right-sided subdural collection measuring approximately 12.5 x 3 x 7.3 cm with compression of the right lateral  ventricle causing 7 mm midline shift to the right and downward displacement right lateral ventricle relatively similar to prior exam. Similar appearance of broad-based left-sided subdural collection with maximal thickness of 7 mm without significant change. No new area of intracranial hemorrhage. Chronic microvascular changes without CT evidence of large acute infarct. Atrophy. No intracranial mass lesion noted on this unenhanced exam. Vascular: Vascular calcifications.  No acute hyperdense vessel. Skull: No acute skull fracture. Sinuses/Orbits: Post lens replacement. No acute orbital abnormality. Partial opacification left sphenoid sinus. Other: Mastoid air cells and middle ear cavities are clear. IMPRESSION: 1. Relatively similar appearance to prior exam. 2. Large right-sided subdural collection measuring approximately 12.5 x 3 x 7.3 cm with compression of the right lateral ventricle causing 7 mm midline shift to the right and downward displacement right lateral ventricle relatively similar to prior exam. 3. Similar appearance of broad-based  left-sided subdural collection with maximal thickness of 7 mm. 4. No new area of intracranial hemorrhage. 5. Chronic microvascular changes. 6. Atrophy. Electronically Signed   By: Genia Del M.D.   On: 07/05/2018 08:08   Ct Head Wo Contrast  Result Date: 07/02/2018 CLINICAL DATA:  Subdural hematomas. Increased lethargy. EXAM: CT HEAD WITHOUT CONTRAST TECHNIQUE: Contiguous axial images were obtained from the base of the skull through the vertex without intravenous contrast. COMPARISON:  07/01/2018 FINDINGS: Brain: Large subdural hematoma over the right cerebral convexity of mixed though predominantly low density is unchanged in size measuring up to 30 mm in thickness with unchanged mass effect including 7 mm of leftward midline shift. Mixed density subdural hematoma over the left cerebral convexity is also unchanged in size measuring 7 mm in thickness. No new  intracranial hemorrhage or acute large territory infarct is identified. The ventricles are unchanged in size. Patchy cerebral white matter hypodensities are unchanged and nonspecific but compatible with mild chronic small vessel ischemic disease. The basilar cisterns are patent. Vascular: Calcified atherosclerosis at the skull base. No hyperdense vessel. Skull: No fracture or focal osseous lesion. Sinuses/Orbits: Unchanged small volume left sphenoid sinus fluid. Clear mastoid air cells. Bilateral cataract extraction. Other: None. IMPRESSION: 1. Unchanged large right and small left left subdural hematomas. Unchanged 7 mm leftward midline shift. 2. No new intracranial abnormality. Electronically Signed   By: Logan Bores M.D.   On: 07/02/2018 13:40   Ct Head Wo Contrast  Addendum Date: 07/01/2018   ADDENDUM REPORT: 07/01/2018 16:28 ADDENDUM: Critical Value/emergent results were called by telephone at the time of interpretation on 07/01/2018 at 1624 hours to Dr. Fredia Sorrow , who verbally acknowledged these results. Electronically Signed   By: Genevie Ann M.D.   On: 07/01/2018 16:28   Result Date: 07/01/2018 CLINICAL DATA:  84 year old female status post polytrauma. Bilateral subdural hematomas since earlier this month. EXAM: CT HEAD WITHOUT CONTRAST CT CERVICAL SPINE WITHOUT CONTRAST TECHNIQUE: Multidetector CT imaging of the head and cervical spine was performed following the standard protocol without intravenous contrast. Multiplanar CT image reconstructions of the cervical spine were also generated. COMPARISON:  06/15/2018 head CT and earlier, including cervical spine CT 05/07/2018. FINDINGS: CT HEAD FINDINGS Brain: Progressed right side mostly hypodense extra-axial collection and associated mass effect on the brain. The collection is now biconvex measuring up to 27 millimeters in thickness, with hyperdensity along the margins. See coronal image 29). There is associated leftward midline shift of 7  millimeters, new since 06/15/2018. At the same time the contralateral left mixed density subdural hematoma has decreased to 5-7 millimeters in thickness along the anterior left convexity. No intra-axial or intraventricular blood. Mass effect on the right lateral ventricle. Stable ventricle size otherwise. Basilar cisterns remain patent. No cortically based acute infarct identified. Vascular: Calcified atherosclerosis at the skull base. Skull: No skull fracture identified. Sinuses/Orbits: Small low-density fluid level in the left sphenoid sinus. Other Visualized paranasal sinuses and mastoids are stable and well pneumatized. Other: No acute orbit or scalp soft tissue findings. CT CERVICAL SPINE FINDINGS Alignment: Stable mild straightening. Cervicothoracic junction alignment is within normal limits. Bilateral posterior element alignment is within normal limits. Skull base and vertebrae: Osteopenia. No acute cervical spine fracture. Soft tissues and spinal canal: No prevertebral fluid or swelling. No visible canal hematoma. Bulky left carotid calcified atherosclerosis again noted. Disc levels: Stable. Advanced mid and lower cervical disc and endplate degeneration. Upper chest: Left posterior 1st through 3rd rib fractures with some periosteal reaction.  These are relatively nondisplaced. Negative visible lung apices. Negative noncontrast thoracic inlet. Visible upper thoracic vertebral bodies appear to remain intact. IMPRESSION: 1. Progressed right side extra-axial collection since the subdural hematoma on 06/15/2018. This is now biconvex, measuring up to 27 mm in thickness, and associated with increased brain mass effect including 7 mm of leftward midline shift. 2. Contralateral left subdural hematoma has regressed, now 5-7 mm thickness. 3. Subacute appearing left posterior 1st through 3rd rib fractures with periosteal reaction. 4. Osteopenia.  No skull or cervical spine fracture identified. Electronically Signed: By:  Genevie Ann M.D. On: 07/01/2018 16:20   Ct Head Wo Contrast  Result Date: 06/15/2018 CLINICAL DATA:  84 year old female with intracranial bleed. Alzheimer's. Subsequent encounter. EXAM: CT HEAD WITHOUT CONTRAST TECHNIQUE: Contiguous axial images were obtained from the base of the skull through the vertex without intravenous contrast. COMPARISON:  06/15/2018 2:02 p.m.  05/07/2018. FINDINGS: Brain: Broad-based bilateral subdural hematomas measuring up to 13 mm on the right and 11 mm on left. Local mass effect without midline shift. Focal blood anterior right middle cranial fossa measuring up to 11 mm. Chronic microvascular changes. Global atrophy. No intracranial mass lesion noted on this unenhanced exam. Vascular: Vascular calcifications Skull: No skull fracture. Sinuses/Orbits: Post lens replacement. No acute orbital abnormality. Partial opacification left sphenoid sinus. Other: Mastoid air cells and middle ear cavities are clear. IMPRESSION: 1. Overall no significant change since exam performed earlier today although significant change compared to 05/07/2018. 2. Broad-based bilateral subdural hematomas measuring up to 13 mm on the right and 11 mm on left. Local mass effect without midline shift. 3. Focal blood collection anterior right middle cranial fossa measuring up to 11 mm. This may simply represent clotted blood but is immediately adjacent to the right middle cerebral artery bifurcation and thrombosed aneurysm cannot be entirely excluded. 4. Chronic microvascular changes. Global atrophy. 5. Partial opacification left sphenoid sinus. Electronically Signed   By: Genia Del M.D.   On: 06/15/2018 19:48   Ct Cervical Spine Wo Contrast  Addendum Date: 07/01/2018   ADDENDUM REPORT: 07/01/2018 16:28 ADDENDUM: Critical Value/emergent results were called by telephone at the time of interpretation on 07/01/2018 at 1624 hours to Dr. Fredia Sorrow , who verbally acknowledged these results. Electronically Signed    By: Genevie Ann M.D.   On: 07/01/2018 16:28   Result Date: 07/01/2018 CLINICAL DATA:  84 year old female status post polytrauma. Bilateral subdural hematomas since earlier this month. EXAM: CT HEAD WITHOUT CONTRAST CT CERVICAL SPINE WITHOUT CONTRAST TECHNIQUE: Multidetector CT imaging of the head and cervical spine was performed following the standard protocol without intravenous contrast. Multiplanar CT image reconstructions of the cervical spine were also generated. COMPARISON:  06/15/2018 head CT and earlier, including cervical spine CT 05/07/2018. FINDINGS: CT HEAD FINDINGS Brain: Progressed right side mostly hypodense extra-axial collection and associated mass effect on the brain. The collection is now biconvex measuring up to 27 millimeters in thickness, with hyperdensity along the margins. See coronal image 29). There is associated leftward midline shift of 7 millimeters, new since 06/15/2018. At the same time the contralateral left mixed density subdural hematoma has decreased to 5-7 millimeters in thickness along the anterior left convexity. No intra-axial or intraventricular blood. Mass effect on the right lateral ventricle. Stable ventricle size otherwise. Basilar cisterns remain patent. No cortically based acute infarct identified. Vascular: Calcified atherosclerosis at the skull base. Skull: No skull fracture identified. Sinuses/Orbits: Small low-density fluid level in the left sphenoid sinus. Other Visualized paranasal sinuses  and mastoids are stable and well pneumatized. Other: No acute orbit or scalp soft tissue findings. CT CERVICAL SPINE FINDINGS Alignment: Stable mild straightening. Cervicothoracic junction alignment is within normal limits. Bilateral posterior element alignment is within normal limits. Skull base and vertebrae: Osteopenia. No acute cervical spine fracture. Soft tissues and spinal canal: No prevertebral fluid or swelling. No visible canal hematoma. Bulky left carotid calcified  atherosclerosis again noted. Disc levels: Stable. Advanced mid and lower cervical disc and endplate degeneration. Upper chest: Left posterior 1st through 3rd rib fractures with some periosteal reaction. These are relatively nondisplaced. Negative visible lung apices. Negative noncontrast thoracic inlet. Visible upper thoracic vertebral bodies appear to remain intact. IMPRESSION: 1. Progressed right side extra-axial collection since the subdural hematoma on 06/15/2018. This is now biconvex, measuring up to 27 mm in thickness, and associated with increased brain mass effect including 7 mm of leftward midline shift. 2. Contralateral left subdural hematoma has regressed, now 5-7 mm thickness. 3. Subacute appearing left posterior 1st through 3rd rib fractures with periosteal reaction. 4. Osteopenia.  No skull or cervical spine fracture identified. Electronically Signed: By: Genevie Ann M.D. On: 07/01/2018 16:20   US Renal  Result Date: 06/16/2018 CLINICAL DATA:  Acute kidney injury EXAM: RENAL / URINARY TRACT ULTRASOUND COMPLETE COMPARISON:  None. FINDINGS: Right Kidney: Renal measurements: 10.2 x 3.9 x 4.5 cm = volume: 91 mL. 2 cm lower pole cyst. Negative for hydronephrosis. Left Kidney: Renal measurements: 10.6 x 6.7 x 5.7 cm = volume: 212 mL. Renal size and volume measurement includes left renal cyst measuring 4 x 5 cm with layering debris. Negative for hydronephrosis. Bladder: Normal appearing bladder.  Bilateral ureteral jets. IMPRESSION: Normal renal size. Complex cyst left kidney 4 x 5 cm. Negative for hydronephrosis Electronically Signed   By: Franchot Gallo M.D.   On: 06/16/2018 10:31    Lab Data:  CBC: Recent Labs  Lab 07/01/18 1531 07/01/18 1541 07/03/18 0517 07/04/18 0525 07/05/18 0430  WBC 6.7  --  5.0 8.0 6.4  NEUTROABS 4.2  --   --   --   --   HGB 12.1 12.2 12.4 11.4* 11.5*  HCT 38.0 36.0 38.8 34.3* 34.9*  MCV 93.4  --  92.8 92.5 93.6  PLT 291  --  264 290 353   Basic Metabolic  Panel: Recent Labs  Lab 07/01/18 1531 07/01/18 1541 07/02/18 0412 07/03/18 0517 07/04/18 0525 07/05/18 0430  NA 137 137 140 141 142 141  K 4.1 4.0 4.0 4.2 3.9 3.9  CL 98 100 101 105 109 108  CO2 26  --  27 21* 22 27  GLUCOSE 118* 114* 114* 182* 157* 148*  BUN 25* 27* 20 25* 27* 23  CREATININE 1.18* 1.20* 1.22* 1.11* 1.05* 1.00  CALCIUM 10.0  --  9.6 9.2 9.0 8.7*   GFR: Estimated Creatinine Clearance: 38.5 mL/min (by C-G formula based on SCr of 1 mg/dL). Liver Function Tests: Recent Labs  Lab 07/01/18 1531  AST 18  ALT 12  ALKPHOS 77  BILITOT 0.5  PROT 6.5  ALBUMIN 3.6   No results for input(s): LIPASE, AMYLASE in the last 168 hours. No results for input(s): AMMONIA in the last 168 hours. Coagulation Profile: Recent Labs  Lab 07/01/18 1531  INR 0.92   Cardiac Enzymes: No results for input(s): CKTOTAL, CKMB, CKMBINDEX, TROPONINI in the last 168 hours. BNP (last 3 results) No results for input(s): PROBNP in the last 8760 hours. HbA1C: No results for input(s): HGBA1C in  the last 72 hours. CBG: Recent Labs  Lab 07/06/18 0617 07/06/18 1306 07/06/18 1651 07/06/18 2111 07/07/18 0731  GLUCAP 134* 107* 142* 142* 137*   Lipid Profile: No results for input(s): CHOL, HDL, LDLCALC, TRIG, CHOLHDL, LDLDIRECT in the last 72 hours. Thyroid Function Tests: No results for input(s): TSH, T4TOTAL, FREET4, T3FREE, THYROIDAB in the last 72 hours. Anemia Panel: No results for input(s): VITAMINB12, FOLATE, FERRITIN, TIBC, IRON, RETICCTPCT in the last 72 hours. Urine analysis:    Component Value Date/Time   COLORURINE YELLOW 07/01/2018 1834   APPEARANCEUR CLEAR 07/01/2018 1834   LABSPEC <1.005 (L) 07/01/2018 1834   PHURINE 5.0 07/01/2018 1834   GLUCOSEU NEGATIVE 07/01/2018 1834   HGBUR NEGATIVE 07/01/2018 1834   BILIRUBINUR NEGATIVE 07/01/2018 1834   KETONESUR NEGATIVE 07/01/2018 1834   PROTEINUR NEGATIVE 07/01/2018 1834   NITRITE NEGATIVE 07/01/2018 1834   LEUKOCYTESUR  TRACE (A) 07/01/2018 1834     Eber Ferrufino M.D. [facility] 07/07/2018, 12:17 PM  Pager: [phone] Between 7am to 7pm - call Pager - [phone]  After 7pm go to [email or web address] - password [email or web address]  Call night coverage person covering after 7pm

## 2018-07-08 ENCOUNTER — Inpatient Hospital Stay (HOSPITAL_COMMUNITY): Payer: MEDICARE

## 2018-07-08 LAB — CBC
HEMATOCRIT: 36.9 % (ref 36.0–46.0)
Hemoglobin: 12.3 g/dL (ref 12.0–15.0)
MCH: 30.8 pg (ref 26.0–34.0)
MCHC: 33.3 g/dL (ref 30.0–36.0)
MCV: 92.3 fL (ref 80.0–100.0)
Platelets: 295 10*3/uL (ref 150–400)
RBC: 4 MIL/uL (ref 3.87–5.11)
RDW: 12.6 % (ref 11.5–15.5)
WBC: 5.2 10*3/uL (ref 4.0–10.5)
nRBC: 0 % (ref 0.0–0.2)

## 2018-07-08 LAB — BASIC METABOLIC PANEL
Anion gap: 9 (ref 5–15)
BUN: 7 mg/dL — ABNORMAL LOW (ref 8–23)
CO2: 26 mmol/L (ref 22–32)
Calcium: 8.8 mg/dL — ABNORMAL LOW (ref 8.9–10.3)
Chloride: 107 mmol/L (ref 98–111)
Creatinine, Ser: 0.83 mg/dL (ref 0.44–1.00)
GFR calc non Af Amer: >60 mL/min (ref >60)
Glucose, Bld: 121 mg/dL — ABNORMAL HIGH (ref 70–99)
Potassium: 3.5 mmol/L (ref 3.5–5.1)
Sodium: 142 mmol/L (ref 135–145)

## 2018-07-08 LAB — GLUCOSE, CAPILLARY
Glucose-Capillary: 107 mg/dL — ABNORMAL HIGH (ref 70–99)
Glucose-Capillary: 132 mg/dL — ABNORMAL HIGH (ref 70–99)
Glucose-Capillary: 103 mg/dL — ABNORMAL HIGH (ref 70–99)
Glucose-Capillary: 98 mg/dL (ref 70–99)

## 2018-07-08 MED ORDER — DIPHENHYDRAMINE HCL 25 MG PO CAPS: 25.0000 mg | ORAL_CAPSULE | Freq: Four times a day (QID) | ORAL | 0 refills | Status: DC | PRN

## 2018-07-08 MED ORDER — INSULIN ASPART 100 UNIT/ML ~~LOC~~ SOLN: 0.0000 [IU] | Freq: Three times a day (TID) | SUBCUTANEOUS | 11 refills | Status: DC

## 2018-07-08 MED ORDER — DIPHENHYDRAMINE HCL 25 MG PO CAPS: 25.0000 mg | ORAL_CAPSULE | Freq: Four times a day (QID) | ORAL | Status: DC | PRN

## 2018-07-08 MED ORDER — ACETAMINOPHEN 325 MG PO TABS: 650.0000 mg | ORAL_TABLET | ORAL | Status: AC | PRN

## 2018-07-08 MED ORDER — LEVETIRACETAM 1000 MG PO TABS: 1000.0000 mg | ORAL_TABLET | Freq: Two times a day (BID) | ORAL | 3 refills | Status: DC

## 2018-07-08 NOTE — Social Work (Addendum)
3:15pm- [facility] reviewing clinicals.  2:38pm- Spoke with pt daughter, she understands that there are no beds, requested again that pt daughter choose from available offers. Pt daughter requesting referral to [facility] in [place], called and left message for admissions liaison. Again, requested daughter to select an offer from available beds.   2:20pm- [facility] returned call, there are currently no beds for SNF available.   1:09pm- All offers given to pt daughter at bedside. Pt daughter unhappy that [facility] does not have a bed until Monday. Declines all offers, requests referral be sent to [facility], [place], [place]. Explained that it is out of 50 mile range and ambulance would be private pay, estimate given by [facility] as $1,267.80. Pt daughter agreeable to pay and still requesting referral be sent. Referral sent o [phone].   Pt daughter aware of discharge order, knows that if [facility] cannot take that we will have to select another offer for discharge.  Westley Hummer, MSW, [place] Work [phone]

## 2018-07-08 NOTE — Progress Notes (Signed)
[facility]                                                                              Patient Demographics  Bianca Wilson, is a 84 y.o. female, DOB - [birth year], CHY:[id number]  Admit date - 07/01/2018   Admitting Physician Georgette Shell, MD  Outpatient Primary MD for the patient is Raelyn Number, MD  Outpatient specialists:   LOS - 7  days   Medical records reviewed and are as summarized below:    Chief Complaint  Patient presents with  . Cerebrovascular Accident  . Weakness       Brief summary  Per Dr. Wilber Oliphant Viramontes is a 84 y.o. female with medical history significant of Alzheimer's dementia, recent hospital admission for bilateral subdural hematoma patient was admitted 06/16/2018 and discharged the same day as family did not want surgery so she was discharged back to assisted living facility.  She was asked to resume aspirin 2 weeks after discharge.  She had a fall on the morning of admission and the staff noticed that she was dragging her left foot and left leg and she was sent to the ER.  She is not on any blood thinners at this time.  Head showed progression of right subdural hematoma compared to the CT on 12/3.  Neurosurgery was consulted    Assessment & Plan       Subdural hematoma ([place]) bilateral, right worse than left, with the left-sided weakness -Patient was recently admitted from 12/3 to 12/4 with fall and bilateral subdural hematomas.  She also has a history of advanced dementia.  Neurosurgery was consulted, however patient's family did not want any surgery, patient was discharged to skilled nursing facility. -Neurosurgery was consulted, recommended observation, EEG and antiseizure medication.  Patient was started on Keppra. -Repeat CT head showed stable SDH, neuro recommended continue Keppra and observe over the weekend as patient's daughter interested in burr hole surgery if it is recommended and possible by neurosurgery.  -  EEG showed moderate generalized slowing but no seizures at the time. -Repeat CT head today showed stable mixed density 7 mm left subdural hematoma and predominantly hypodense 2.8 right subdural collection.  Stable 7 mm right to left midline shift with partially effaced right lateral ventricle. -Per neurosurgery, no need for any burr hole surgery at this time, recommended to continue observe, outpatient follow-up, continue Keppra. - awaiting skilled nursing facility   Acute metabolic encephalopathy superimposed on dementia, worsened due to subdural hematoma -Likely due to #1, continue Keppra, received IV Decadron 4 doses    Acute kidney injury -creatinine resolved   Alzheimer's dementia -Per family, she is in currently dementia unit and has been progressively getting worse, takes Aricept -Palliative medicine has been consulted for goals of care  Hyperlipidemia Cont Lipitor.  Diabetes mellitus type 2 CBGs controlled, continue sliding scale insulin, hold metformin  Hypertension -Currently stable  Hypothyroidism Continue Synthroid  Dysphagia Continue dysphagia 1 diet with nectar thick liquids   Code Status: Full CODE STATUS DVT Prophylaxis:  SCD's Family Communication:  D/w patient's daughter and grand daughter in the room  Disposition Plan: Awaiting  SNF   Time Spent in minutes 15 minutes  Procedures:  CT head  Consultants:   Neurosurgery Neurology Palliative medicine  Antimicrobials:      Medications  Scheduled Meds: . amLODipine  10 mg Oral Daily  . atorvastatin  20 mg Oral Daily  . insulin aspart  0-9 Units Subcutaneous TID WC  . levothyroxine  25 mcg Oral Q0600  . pneumococcal 23 valent vaccine  0.5 mL Intramuscular Tomorrow-1000   Continuous Infusions: . sodium chloride 50 mL/hr at 07/08/18 1120   PRN Meds:.   Antibiotics   Anti-infectives (From admission, onward)   None        Subjective:   Bianca Wilson was seen and examined today.  More  alert and awake. Still left sided weakness persisting. No fever chills, dizziness, nausea vomiting.  Objective:   Vitals:   07/07/18 1948 07/08/18 0407 07/08/18 0754 07/08/18 1124  BP: (!) 157/62 (!) 167/64 (!) 155/48 (!) 166/70  Pulse: 64 79 (!) 59 73  Resp: 18 18 17 18   Temp: 98.3 F (36.8 C) 98.4 F (36.9 C) 97.6 F (36.4 C) 98.5 F (36.9 C)  TempSrc: Oral Oral Axillary Oral  SpO2: 96% 98% 99% 98%  Weight:      Height:        Intake/Output Summary (Last 24 hours) at 07/08/2018 1627 Last data filed at 07/07/2018 1650 Gross per 24 hour  Intake -  Output 425 ml  Net -425 ml     Wt Readings from Last 3 Encounters:  07/01/18 73 kg  06/15/18 73.2 kg     Physical Exam  General: Alert and awake, NAD   Eyes:   HEENT:    Cardiovascular: S1 S2 clear, RRR. No pedal edema b/l  Respiratory: CTAB, no wheezing, rales or rhonchi  Gastrointestinal: Soft, nontender, nondistended, NBS  Ext: no pedal edema bilaterally  Neuro: left side weakness +  Musculoskeletal: No cyanosis, clubbing  Skin: No rashes  Psych: alert and awake     Data Reviewed:  I have personally reviewed following labs and imaging studies  Micro Results No results found for this or any previous visit (from the past 240 hour(s)).  Radiology Reports Ct Head Wo Contrast  Result Date: 07/08/2018 CLINICAL DATA:  Follow up subdural hematoma. EXAM: CT HEAD WITHOUT CONTRAST TECHNIQUE: Contiguous axial images were obtained from the base of the skull through the vertex without intravenous contrast. COMPARISON:  CT HEAD July 05, 2018 and CT HEAD April 24, 2018. FINDINGS: BRAIN: 7 mm mixed density LEFT holo hemispheric subdural hematoma stable from prior examination. Stable predominately hypodense lentiform RIGHT extra-axial fluid collection measuring to 2.8 cm in transaxial dimension. Similar 7 mm RIGHT to LEFT midline shift with partially effaced RIGHT lateral ventricle. RIGHT temporal  periventricular hypodensity. Mild RIGHT periventricular white matter hypodensities. No intraparenchymal hemorrhage or acute large vascular territory infarct. Basal cisterns are patent. VASCULAR: Moderate calcific atherosclerosis of the carotid siphons. SKULL: No skull fracture. No significant scalp soft tissue swelling. SINUSES/ORBITS: Small LEFT sphenoid sinus air-fluid level. Mastoid air cells are well aerated.The included ocular globes and orbital contents are non-suspicious. Status post bilateral ocular lens implants. OTHER: None. IMPRESSION: 1. Stable mixed density 7 mm LEFT subdural hematoma and predominantly hypodense 2.8 cm RIGHT subdural collection. 2. Stable 7 mm RIGHT to LEFT midline shift. Equivocal ventricular entrapment with interstitial edema and/or chronic small vessel ischemic changes. Electronically Signed   By: Elon Alas M.D.   On: 07/08/2018 01:03   Ct Head Wo Contrast  Result Date: 07/05/2018 CLINICAL DATA:  84 year old female with intracranial hemorrhage. Follow-up. Alzheimer's. Subsequent encounter. EXAM: CT HEAD WITHOUT CONTRAST TECHNIQUE: Contiguous axial images were obtained from the base of the skull through the vertex without intravenous contrast. COMPARISON:  Several prior exams most recent 07/02/2018. FINDINGS: Brain: Large right-sided subdural collection measuring approximately 12.5 x 3 x 7.3 cm with compression of the right lateral ventricle causing 7 mm midline shift to the right and downward displacement right lateral ventricle relatively similar to prior exam. Similar appearance of broad-based left-sided subdural collection with maximal thickness of 7 mm without significant change. No new area of intracranial hemorrhage. Chronic microvascular changes without CT evidence of large acute infarct. Atrophy. No intracranial mass lesion noted on this unenhanced exam. Vascular: Vascular calcifications.  No acute hyperdense vessel. Skull: No acute skull fracture.  Sinuses/Orbits: Post lens replacement. No acute orbital abnormality. Partial opacification left sphenoid sinus. Other: Mastoid air cells and middle ear cavities are clear. IMPRESSION: 1. Relatively similar appearance to prior exam. 2. Large right-sided subdural collection measuring approximately 12.5 x 3 x 7.3 cm with compression of the right lateral ventricle causing 7 mm midline shift to the right and downward displacement right lateral ventricle relatively similar to prior exam. 3. Similar appearance of broad-based left-sided subdural collection with maximal thickness of 7 mm. 4. No new area of intracranial hemorrhage. 5. Chronic microvascular changes. 6. Atrophy. Electronically Signed   By: Genia Del M.D.   On: 07/05/2018 08:08   Ct Head Wo Contrast  Result Date: 07/02/2018 CLINICAL DATA:  Subdural hematomas. Increased lethargy. EXAM: CT HEAD WITHOUT CONTRAST TECHNIQUE: Contiguous axial images were obtained from the base of the skull through the vertex without intravenous contrast. COMPARISON:  07/01/2018 FINDINGS: Brain: Large subdural hematoma over the right cerebral convexity of mixed though predominantly low density is unchanged in size measuring up to 30 mm in thickness with unchanged mass effect including 7 mm of leftward midline shift. Mixed density subdural hematoma over the left cerebral convexity is also unchanged in size measuring 7 mm in thickness. No new intracranial hemorrhage or acute large territory infarct is identified. The ventricles are unchanged in size. Patchy cerebral white matter hypodensities are unchanged and nonspecific but compatible with mild chronic small vessel ischemic disease. The basilar cisterns are patent. Vascular: Calcified atherosclerosis at the skull base. No hyperdense vessel. Skull: No fracture or focal osseous lesion. Sinuses/Orbits: Unchanged small volume left sphenoid sinus fluid. Clear mastoid air cells. Bilateral cataract extraction. Other: None.  IMPRESSION: 1. Unchanged large right and small left left subdural hematomas. Unchanged 7 mm leftward midline shift. 2. No new intracranial abnormality. Electronically Signed   By: Logan Bores M.D.   On: 07/02/2018 13:40   Ct Head Wo Contrast  Addendum Date: 07/01/2018   ADDENDUM REPORT: 07/01/2018 16:28 ADDENDUM: Critical Value/emergent results were called by telephone at the time of interpretation on 07/01/2018 at 1624 hours to Dr. Fredia Sorrow , who verbally acknowledged these results. Electronically Signed   By: Genevie Ann M.D.   On: 07/01/2018 16:28   Result Date: 07/01/2018 CLINICAL DATA:  85 year old female status post polytrauma. Bilateral subdural hematomas since earlier this month. EXAM: CT HEAD WITHOUT CONTRAST CT CERVICAL SPINE WITHOUT CONTRAST TECHNIQUE: Multidetector CT imaging of the head and cervical spine was performed following the standard protocol without intravenous contrast. Multiplanar CT image reconstructions of the cervical spine were also generated. COMPARISON:  06/15/2018 head CT and earlier, including cervical spine CT 05/07/2018. FINDINGS: CT HEAD FINDINGS Brain: Progressed  right side mostly hypodense extra-axial collection and associated mass effect on the brain. The collection is now biconvex measuring up to 27 millimeters in thickness, with hyperdensity along the margins. See coronal image 29). There is associated leftward midline shift of 7 millimeters, new since 06/15/2018. At the same time the contralateral left mixed density subdural hematoma has decreased to 5-7 millimeters in thickness along the anterior left convexity. No intra-axial or intraventricular blood. Mass effect on the right lateral ventricle. Stable ventricle size otherwise. Basilar cisterns remain patent. No cortically based acute infarct identified. Vascular: Calcified atherosclerosis at the skull base. Skull: No skull fracture identified. Sinuses/Orbits: Small low-density fluid level in the left sphenoid  sinus. Other Visualized paranasal sinuses and mastoids are stable and well pneumatized. Other: No acute orbit or scalp soft tissue findings. CT CERVICAL SPINE FINDINGS Alignment: Stable mild straightening. Cervicothoracic junction alignment is within normal limits. Bilateral posterior element alignment is within normal limits. Skull base and vertebrae: Osteopenia. No acute cervical spine fracture. Soft tissues and spinal canal: No prevertebral fluid or swelling. No visible canal hematoma. Bulky left carotid calcified atherosclerosis again noted. Disc levels: Stable. Advanced mid and lower cervical disc and endplate degeneration. Upper chest: Left posterior 1st through 3rd rib fractures with some periosteal reaction. These are relatively nondisplaced. Negative visible lung apices. Negative noncontrast thoracic inlet. Visible upper thoracic vertebral bodies appear to remain intact. IMPRESSION: 1. Progressed right side extra-axial collection since the subdural hematoma on 06/15/2018. This is now biconvex, measuring up to 27 mm in thickness, and associated with increased brain mass effect including 7 mm of leftward midline shift. 2. Contralateral left subdural hematoma has regressed, now 5-7 mm thickness. 3. Subacute appearing left posterior 1st through 3rd rib fractures with periosteal reaction. 4. Osteopenia.  No skull or cervical spine fracture identified. Electronically Signed: By: Genevie Ann M.D. On: 07/01/2018 16:20   Ct Head Wo Contrast  Result Date: 06/15/2018 CLINICAL DATA:  84 year old female with intracranial bleed. Alzheimer's. Subsequent encounter. EXAM: CT HEAD WITHOUT CONTRAST TECHNIQUE: Contiguous axial images were obtained from the base of the skull through the vertex without intravenous contrast. COMPARISON:  06/15/2018 2:02 p.m.  05/07/2018. FINDINGS: Brain: Broad-based bilateral subdural hematomas measuring up to 13 mm on the right and 11 mm on left. Local mass effect without midline shift. Focal  blood anterior right middle cranial fossa measuring up to 11 mm. Chronic microvascular changes. Global atrophy. No intracranial mass lesion noted on this unenhanced exam. Vascular: Vascular calcifications Skull: No skull fracture. Sinuses/Orbits: Post lens replacement. No acute orbital abnormality. Partial opacification left sphenoid sinus. Other: Mastoid air cells and middle ear cavities are clear. IMPRESSION: 1. Overall no significant change since exam performed earlier today although significant change compared to 05/07/2018. 2. Broad-based bilateral subdural hematomas measuring up to 13 mm on the right and 11 mm on left. Local mass effect without midline shift. 3. Focal blood collection anterior right middle cranial fossa measuring up to 11 mm. This may simply represent clotted blood but is immediately adjacent to the right middle cerebral artery bifurcation and thrombosed aneurysm cannot be entirely excluded. 4. Chronic microvascular changes. Global atrophy. 5. Partial opacification left sphenoid sinus. Electronically Signed   By: Genia Del M.D.   On: 06/15/2018 19:48   Ct Cervical Spine Wo Contrast  Addendum Date: 07/01/2018   ADDENDUM REPORT: 07/01/2018 16:28 ADDENDUM: Critical Value/emergent results were called by telephone at the time of interpretation on 07/01/2018 at 1624 hours to Dr. Fredia Sorrow , who verbally  acknowledged these results. Electronically Signed   By: Genevie Ann M.D.   On: 07/01/2018 16:28   Result Date: 07/01/2018 CLINICAL DATA:  84 year old female status post polytrauma. Bilateral subdural hematomas since earlier this month. EXAM: CT HEAD WITHOUT CONTRAST CT CERVICAL SPINE WITHOUT CONTRAST TECHNIQUE: Multidetector CT imaging of the head and cervical spine was performed following the standard protocol without intravenous contrast. Multiplanar CT image reconstructions of the cervical spine were also generated. COMPARISON:  06/15/2018 head CT and earlier, including cervical  spine CT 05/07/2018. FINDINGS: CT HEAD FINDINGS Brain: Progressed right side mostly hypodense extra-axial collection and associated mass effect on the brain. The collection is now biconvex measuring up to 27 millimeters in thickness, with hyperdensity along the margins. See coronal image 29). There is associated leftward midline shift of 7 millimeters, new since 06/15/2018. At the same time the contralateral left mixed density subdural hematoma has decreased to 5-7 millimeters in thickness along the anterior left convexity. No intra-axial or intraventricular blood. Mass effect on the right lateral ventricle. Stable ventricle size otherwise. Basilar cisterns remain patent. No cortically based acute infarct identified. Vascular: Calcified atherosclerosis at the skull base. Skull: No skull fracture identified. Sinuses/Orbits: Small low-density fluid level in the left sphenoid sinus. Other Visualized paranasal sinuses and mastoids are stable and well pneumatized. Other: No acute orbit or scalp soft tissue findings. CT CERVICAL SPINE FINDINGS Alignment: Stable mild straightening. Cervicothoracic junction alignment is within normal limits. Bilateral posterior element alignment is within normal limits. Skull base and vertebrae: Osteopenia. No acute cervical spine fracture. Soft tissues and spinal canal: No prevertebral fluid or swelling. No visible canal hematoma. Bulky left carotid calcified atherosclerosis again noted. Disc levels: Stable. Advanced mid and lower cervical disc and endplate degeneration. Upper chest: Left posterior 1st through 3rd rib fractures with some periosteal reaction. These are relatively nondisplaced. Negative visible lung apices. Negative noncontrast thoracic inlet. Visible upper thoracic vertebral bodies appear to remain intact. IMPRESSION: 1. Progressed right side extra-axial collection since the subdural hematoma on 06/15/2018. This is now biconvex, measuring up to 27 mm in thickness, and  associated with increased brain mass effect including 7 mm of leftward midline shift. 2. Contralateral left subdural hematoma has regressed, now 5-7 mm thickness. 3. Subacute appearing left posterior 1st through 3rd rib fractures with periosteal reaction. 4. Osteopenia.  No skull or cervical spine fracture identified. Electronically Signed: By: Genevie Ann M.D. On: 07/01/2018 16:20   US Renal  Result Date: 06/16/2018 CLINICAL DATA:  Acute kidney injury EXAM: RENAL / URINARY TRACT ULTRASOUND COMPLETE COMPARISON:  None. FINDINGS: Right Kidney: Renal measurements: 10.2 x 3.9 x 4.5 cm = volume: 91 mL. 2 cm lower pole cyst. Negative for hydronephrosis. Left Kidney: Renal measurements: 10.6 x 6.7 x 5.7 cm = volume: 212 mL. Renal size and volume measurement includes left renal cyst measuring 4 x 5 cm with layering debris. Negative for hydronephrosis. Bladder: Normal appearing bladder.  Bilateral ureteral jets. IMPRESSION: Normal renal size. Complex cyst left kidney 4 x 5 cm. Negative for hydronephrosis Electronically Signed   By: Franchot Gallo M.D.   On: 06/16/2018 10:31    Lab Data:  CBC: Recent Labs  Lab 07/03/18 0517 07/04/18 0525 07/05/18 0430 07/08/18 0549  WBC 5.0 8.0 6.4 5.2  HGB 12.4 11.4* 11.5* 12.3  HCT 38.8 34.3* 34.9* 36.9  MCV 92.8 92.5 93.6 92.3  PLT 264 290 280 932   Basic Metabolic Panel: Recent Labs  Lab 07/02/18 0412 07/03/18 0517 07/04/18 0525 07/05/18 0430  07/08/18 0549  NA 140 141 142 141 142  K 4.0 4.2 3.9 3.9 3.5  CL 101 105 109 108 107  CO2 27 21* 22 27 26   GLUCOSE 114* 182* 157* 148* 121*  BUN 20 25* 27* 23 7*  CREATININE 1.22* 1.11* 1.05* 1.00 0.83  CALCIUM 9.6 9.2 9.0 8.7* 8.8*   GFR: Estimated Creatinine Clearance: 46.4 mL/min (by C-G formula based on SCr of 0.83 mg/dL). Liver Function Tests: No results for input(s): AST, ALT, ALKPHOS, BILITOT, PROT, ALBUMIN in the last 168 hours. No results for input(s): LIPASE, AMYLASE in the last 168 hours. No results  for input(s): AMMONIA in the last 168 hours. Coagulation Profile: No results for input(s): INR, PROTIME in the last 168 hours. Cardiac Enzymes: No results for input(s): CKTOTAL, CKMB, CKMBINDEX, TROPONINI in the last 168 hours. BNP (last 3 results) No results for input(s): PROBNP in the last 8760 hours. HbA1C: No results for input(s): HGBA1C in the last 72 hours. CBG: Recent Labs  Lab 07/07/18 1135 07/07/18 1636 07/07/18 2122 07/08/18 0619 07/08/18 1135  GLUCAP 78 117* 119* 107* 132*   Lipid Profile: No results for input(s): CHOL, HDL, LDLCALC, TRIG, CHOLHDL, LDLDIRECT in the last 72 hours. Thyroid Function Tests: No results for input(s): TSH, T4TOTAL, FREET4, T3FREE, THYROIDAB in the last 72 hours. Anemia Panel: No results for input(s): VITAMINB12, FOLATE, FERRITIN, TIBC, IRON, RETICCTPCT in the last 72 hours. Urine analysis:    Component Value Date/Time   COLORURINE YELLOW 07/01/2018 1834   APPEARANCEUR CLEAR 07/01/2018 1834   LABSPEC <1.005 (L) 07/01/2018 1834   PHURINE 5.0 07/01/2018 1834   GLUCOSEU NEGATIVE 07/01/2018 1834   HGBUR NEGATIVE 07/01/2018 1834   BILIRUBINUR NEGATIVE 07/01/2018 1834   KETONESUR NEGATIVE 07/01/2018 1834   PROTEINUR NEGATIVE 07/01/2018 1834   NITRITE NEGATIVE 07/01/2018 1834   LEUKOCYTESUR TRACE (A) 07/01/2018 1834     Emrey Thornley M.D. [facility] 07/08/2018, 4:27 PM  Pager: [phone] Between 7am to 7pm - call Pager - [phone]  After 7pm go to [email or web address] - password [email or web address]  Call night coverage person covering after 7pm

## 2018-07-08 NOTE — Progress Notes (Signed)
Patient ID: Bianca Wilson, female   DOB: [birth year], 84 y.o.   MRN: [id number] To be neurologically stable CT is stable with 7 mm of midline shift does seem like the blood is liquefying low but becoming more subacute to chronic. I agree and have extensively talked to the daughter no surgical intervention at this time we'll let the patient to rehabilitation and follow this along a week or 2 with serial CTs. If we see any decline or failed improvement we could certainly revisit the option of bur holes at a later date.

## 2018-07-08 NOTE — Discharge Summary (Signed)
Physician Discharge Summary   Patient ID: Bianca Wilson MRN: [id number] DOB/AGE: 1931/04/06 84 y.o.  Admit date: 07/01/2018 Discharge date: 07/09/2018  Primary Care Physician:  Bianca Number, MD   Recommendations for Outpatient Follow-up:  1. Follow up with PCP in 1-2 weeks 2. Outpatient follow-up with Bianca Wilson in 2 weeks 3. Fall precautions 4. Aspiration precautions 5. Recommend palliative medicine to follow at the facility 6. SLP evaluation for swallowing at the facility to [place]: Patient will discharge to skilled nursing facility Equipment/Devices:   Discharge Condition: Currently stable CODE STATUS: FULL Diet recommendation: Dysphagia 1 diet with nectar thick liquids   Discharge Diagnoses:    . Subdural hematoma (HCC) bilateral, right worse than left with midline shift, left-sided weakness Acute metabolic encephalopathy . HTN (hypertension) . Hypothyroidism . Alzheimer's dementia ([place]) . AKI (acute kidney injury) ([place]) Diabetes mellitus type 2 Dysphagia   Consults:  Neurosurgery Palliative medicine    Allergies:   Allergies  Allergen Reactions  . Diphenhydramine-Acetaminophen     Other reaction(s): Delusions (intolerance)  . Other Other (See Comments)    Muscles Relaxers ,Narcotics      DISCHARGE MEDICATIONS: Allergies as of 07/09/2018      Reactions   Diphenhydramine-acetaminophen    Other reaction(s): Delusions (intolerance)   Other Other (See Comments)   Muscles Relaxers ,Narcotics       Medication List    STOP taking these medications   chlorthalidone 25 MG tablet Commonly known as:  HYGROTON   metFORMIN 500 MG tablet Commonly known as:  GLUCOPHAGE   trimethoprim 100 MG tablet Commonly known as:  TRIMPEX     TAKE these medications   acetaminophen 325 MG tablet Commonly known as:  TYLENOL Take 2 tablets (650 mg total) by mouth every 4 (four) hours as needed for fever, headache or mild pain.   ACIDOPHILUS  PO Take 1 capsule by mouth daily.   alendronate 70 MG tablet Commonly known as:  FOSAMAX Take 70 mg by mouth every Sunday. Take with a full glass of water on an empty stomach.   amLODipine 10 MG tablet Commonly known as:  NORVASC Take 10 mg by mouth daily. For HTN hold for systolic less than 791   atorvastatin 20 MG tablet Commonly known as:  LIPITOR Take 20 mg by mouth daily.   Biotin 1000 MCG tablet Take 1,000 mcg by mouth every morning.   CALCIUM 500 +D 500-400 MG-UNIT Tabs Generic drug:  Calcium Carb-Cholecalciferol Take 1 tablet by mouth 2 (two) times daily.   cholestyramine 4 g packet Commonly known as:  QUESTRAN Take 4 g by mouth daily.   CRANBERRY PO Take 1 capsule by mouth daily.   diphenhydrAMINE 25 mg capsule Commonly known as:  BENADRYL Take 1 capsule (25 mg total) by mouth every 6 (six) hours as needed for itching.   donepezil 10 MG tablet Commonly known as:  ARICEPT Take 10 mg by mouth at bedtime.   escitalopram 20 MG tablet Commonly known as:  LEXAPRO Take 20 mg by mouth daily.   insulin aspart 100 UNIT/ML injection Commonly known as:  novoLOG Inject 0-9 Units into the skin 3 (three) times daily with meals. Sliding scale CBG 70 - 120: 0 units CBG 121 - 150: 1 unit,  CBG 151 - 200: 2 units,  CBG 201 - 250: 3 units,  CBG 251 - 300: 5 units,  CBG 301 - 350: 7 units,  CBG 351 - 400: 9 units   CBG >  400: 9 units and notify your MD   levETIRAcetam 1000 MG tablet Commonly known as:  KEPPRA Take 1 tablet (1,000 mg total) by mouth 2 (two) times daily.   levothyroxine 25 MCG tablet Commonly known as:  SYNTHROID, LEVOTHROID Take 25 mcg by mouth daily before breakfast.   loperamide 2 MG tablet Commonly known as:  IMODIUM A-D Take 2 mg by mouth as needed for diarrhea or loose stools.   memantine 10 MG tablet Commonly known as:  NAMENDA Take 10 mg by mouth 2 (two) times daily.   VITAMIN B12 PO Take 1 tablet by mouth daily.   vitamin C 500 MG  tablet Commonly known as:  ASCORBIC ACID Take 500 mg by mouth daily.        Brief H and P: For complete details please refer to admission H and P, but in brief Per Dr. Wilber Oliphant [name] a 84 y.o.femalewith medical history significant ofAlzheimer's dementia, recent hospital admission for bilateral subdural hematoma patient was admitted 06/16/2018 and discharged the same day as family did not want surgery so she was discharged back to assisted living facility. She was asked to resume aspirin 2 weeks after discharge. She had a fall on the morning of admission and the staff noticed that she was dragging her left foot and left leg and she was sent to the ER. She is not on any blood thinners at this time.  CT Head showed progression of right subdural hematoma compared to the CT on 12/3.  Neurosurgery was consulted.  Hospital Course:  Subdural hematoma (HCC) bilateral, right worse than left, with the left-sided weakness -Patient was recently admitted from 12/3 to 12/4 with fall and bilateral subdural hematomas.  She also has a history of advanced dementia.  Neurosurgery was consulted, however patient's family did not want any surgery, patient was discharged to skilled nursing facility. -Neurosurgery was consulted this admission, recommended observation, EEG and antiseizure medication.  Patient was started on Keppra, which she is tolerating well. -Repeat CT head showed stable SDH, neuro recommended to continue Keppra and observe closely if surgery will be needed for SDH - EEG showed moderate generalized slowing but no seizures at the time. - Left-sided weakness persisting however mental status has been slowly improving.   -Patient underwent serial CT head, latest 07/08/2018 which showed stable mixed density 7 mm left subdural hematoma and predominantly hypodense 2.8 right subdural collection.  Stable 7 mm right to left midline shift with partially effaced right lateral ventricle. -Per  neurosurgery, no need for any burr hole surgery at this time, recommended to continue observe, outpatient follow-up, continue Keppra.  Acute metabolic encephalopathy superimposed on dementia, worsened due to subdural hematoma -Likely due to #1, continue Keppra, patient also received IV Decadron 4 doses  -Mental status slowly improving   Acute kidney injury -Resolved, creatinine 0.8 at the time of discharge  Alzheimer's dementia -Per family, she is in currently dementia unit and has been progressively getting worse, takes Aricept -Palliative medicine was consulted.  Recommend palliative medicine to follow at skilled nursing facility  Hyperlipidemia Restarted Lipitor.  Diabetes mellitus type 2 CBGs controlled, continue sliding scale insulin, hold metformin  Hypertension -Currently stable  Hypothyroidism Continue Synthroid  Dysphagia Continue dysphagia 1 diet with nectar thick liquids Recommend outpatient SLP evaluation to upgrade diet as tolerated   Day of Discharge S: Alert and awake, daughter at the bedside, no acute complaints.  BP (!) 157/93 (BP Location: Left Arm)   Pulse 65   Temp 98.2  F (36.8 C) (Oral)   Resp 16   Ht 5\' 2"  (1.575 m)   Wt 73 kg   SpO2 98%   BMI 29.44 kg/m   Physical Exam: General: Alert and awake oriented, NAD HEENT: anicteric sclera, pupils reactive to light and accommodation CVS: S1-S2 clear no murmur rubs or gallops Chest: clear to auscultation bilaterally, no wheezing rales or rhonchi Abdomen: soft nontender, nondistended, normal bowel sounds Extremities: no cyanosis, clubbing or edema noted bilaterally Neuro: Left side weakness persisting   The results of significant diagnostics from this hospitalization (including imaging, microbiology, ancillary and laboratory) are listed below for reference.      Procedures/Studies:  Ct Head Wo Contrast  Result Date: 07/08/2018 CLINICAL DATA:  Follow up subdural hematoma. EXAM: CT  HEAD WITHOUT CONTRAST TECHNIQUE: Contiguous axial images were obtained from the base of the skull through the vertex without intravenous contrast. COMPARISON:  CT HEAD July 05, 2018 and CT HEAD April 24, 2018. FINDINGS: BRAIN: 7 mm mixed density LEFT holo hemispheric subdural hematoma stable from prior examination. Stable predominately hypodense lentiform RIGHT extra-axial fluid collection measuring to 2.8 cm in transaxial dimension. Similar 7 mm RIGHT to LEFT midline shift with partially effaced RIGHT lateral ventricle. RIGHT temporal periventricular hypodensity. Mild RIGHT periventricular white matter hypodensities. No intraparenchymal hemorrhage or acute large vascular territory infarct. Basal cisterns are patent. VASCULAR: Moderate calcific atherosclerosis of the carotid siphons. SKULL: No skull fracture. No significant scalp soft tissue swelling. SINUSES/ORBITS: Small LEFT sphenoid sinus air-fluid level. Mastoid air cells are well aerated.The included ocular globes and orbital contents are non-suspicious. Status post bilateral ocular lens implants. OTHER: None. IMPRESSION: 1. Stable mixed density 7 mm LEFT subdural hematoma and predominantly hypodense 2.8 cm RIGHT subdural collection. 2. Stable 7 mm RIGHT to LEFT midline shift. Equivocal ventricular entrapment with interstitial edema and/or chronic small vessel ischemic changes. Electronically Signed   By: Elon Alas M.D.   On: 07/08/2018 01:03   Ct Head Wo Contrast  Result Date: 07/05/2018 CLINICAL DATA:  84 year old female with intracranial hemorrhage. Follow-up. Alzheimer's. Subsequent encounter. EXAM: CT HEAD WITHOUT CONTRAST TECHNIQUE: Contiguous axial images were obtained from the base of the skull through the vertex without intravenous contrast. COMPARISON:  Several prior exams most recent 07/02/2018. FINDINGS: Brain: Large right-sided subdural collection measuring approximately 12.5 x 3 x 7.3 cm with compression of the right lateral  ventricle causing 7 mm midline shift to the right and downward displacement right lateral ventricle relatively similar to prior exam. Similar appearance of broad-based left-sided subdural collection with maximal thickness of 7 mm without significant change. No new area of intracranial hemorrhage. Chronic microvascular changes without CT evidence of large acute infarct. Atrophy. No intracranial mass lesion noted on this unenhanced exam. Vascular: Vascular calcifications.  No acute hyperdense vessel. Skull: No acute skull fracture. Sinuses/Orbits: Post lens replacement. No acute orbital abnormality. Partial opacification left sphenoid sinus. Other: Mastoid air cells and middle ear cavities are clear. IMPRESSION: 1. Relatively similar appearance to prior exam. 2. Large right-sided subdural collection measuring approximately 12.5 x 3 x 7.3 cm with compression of the right lateral ventricle causing 7 mm midline shift to the right and downward displacement right lateral ventricle relatively similar to prior exam. 3. Similar appearance of broad-based left-sided subdural collection with maximal thickness of 7 mm. 4. No new area of intracranial hemorrhage. 5. Chronic microvascular changes. 6. Atrophy. Electronically Signed   By: Genia Del M.D.   On: 07/05/2018 08:08   Ct Head Wo Contrast  Result Date: 07/02/2018 CLINICAL DATA:  Subdural hematomas. Increased lethargy. EXAM: CT HEAD WITHOUT CONTRAST TECHNIQUE: Contiguous axial images were obtained from the base of the skull through the vertex without intravenous contrast. COMPARISON:  07/01/2018 FINDINGS: Brain: Large subdural hematoma over the right cerebral convexity of mixed though predominantly low density is unchanged in size measuring up to 30 mm in thickness with unchanged mass effect including 7 mm of leftward midline shift. Mixed density subdural hematoma over the left cerebral convexity is also unchanged in size measuring 7 mm in thickness. No new  intracranial hemorrhage or acute large territory infarct is identified. The ventricles are unchanged in size. Patchy cerebral white matter hypodensities are unchanged and nonspecific but compatible with mild chronic small vessel ischemic disease. The basilar cisterns are patent. Vascular: Calcified atherosclerosis at the skull base. No hyperdense vessel. Skull: No fracture or focal osseous lesion. Sinuses/Orbits: Unchanged small volume left sphenoid sinus fluid. Clear mastoid air cells. Bilateral cataract extraction. Other: None. IMPRESSION: 1. Unchanged large right and small left left subdural hematomas. Unchanged 7 mm leftward midline shift. 2. No new intracranial abnormality. Electronically Signed   By: Logan Bores M.D.   On: 07/02/2018 13:40   Ct Head Wo Contrast  Addendum Date: 07/01/2018   ADDENDUM REPORT: 07/01/2018 16:28 ADDENDUM: Critical Value/emergent results were called by telephone at the time of interpretation on 07/01/2018 at 1624 hours to Dr. Fredia Sorrow , who verbally acknowledged these results. Electronically Signed   By: Genevie Ann M.D.   On: 07/01/2018 16:28   Result Date: 07/01/2018 CLINICAL DATA:  84 year old female status post polytrauma. Bilateral subdural hematomas since earlier this month. EXAM: CT HEAD WITHOUT CONTRAST CT CERVICAL SPINE WITHOUT CONTRAST TECHNIQUE: Multidetector CT imaging of the head and cervical spine was performed following the standard protocol without intravenous contrast. Multiplanar CT image reconstructions of the cervical spine were also generated. COMPARISON:  06/15/2018 head CT and earlier, including cervical spine CT 05/07/2018. FINDINGS: CT HEAD FINDINGS Brain: Progressed right side mostly hypodense extra-axial collection and associated mass effect on the brain. The collection is now biconvex measuring up to 27 millimeters in thickness, with hyperdensity along the margins. See coronal image 29). There is associated leftward midline shift of 7  millimeters, new since 06/15/2018. At the same time the contralateral left mixed density subdural hematoma has decreased to 5-7 millimeters in thickness along the anterior left convexity. No intra-axial or intraventricular blood. Mass effect on the right lateral ventricle. Stable ventricle size otherwise. Basilar cisterns remain patent. No cortically based acute infarct identified. Vascular: Calcified atherosclerosis at the skull base. Skull: No skull fracture identified. Sinuses/Orbits: Small low-density fluid level in the left sphenoid sinus. Other Visualized paranasal sinuses and mastoids are stable and well pneumatized. Other: No acute orbit or scalp soft tissue findings. CT CERVICAL SPINE FINDINGS Alignment: Stable mild straightening. Cervicothoracic junction alignment is within normal limits. Bilateral posterior element alignment is within normal limits. Skull base and vertebrae: Osteopenia. No acute cervical spine fracture. Soft tissues and spinal canal: No prevertebral fluid or swelling. No visible canal hematoma. Bulky left carotid calcified atherosclerosis again noted. Disc levels: Stable. Advanced mid and lower cervical disc and endplate degeneration. Upper chest: Left posterior 1st through 3rd rib fractures with some periosteal reaction. These are relatively nondisplaced. Negative visible lung apices. Negative noncontrast thoracic inlet. Visible upper thoracic vertebral bodies appear to remain intact. IMPRESSION: 1. Progressed right side extra-axial collection since the subdural hematoma on 06/15/2018. This is now biconvex, measuring up to 27 mm  in thickness, and associated with increased brain mass effect including 7 mm of leftward midline shift. 2. Contralateral left subdural hematoma has regressed, now 5-7 mm thickness. 3. Subacute appearing left posterior 1st through 3rd rib fractures with periosteal reaction. 4. Osteopenia.  No skull or cervical spine fracture identified. Electronically Signed: By:  Genevie Ann M.D. On: 07/01/2018 16:20   Ct Head Wo Contrast  Result Date: 06/15/2018 CLINICAL DATA:  84 year old female with intracranial bleed. Alzheimer's. Subsequent encounter. EXAM: CT HEAD WITHOUT CONTRAST TECHNIQUE: Contiguous axial images were obtained from the base of the skull through the vertex without intravenous contrast. COMPARISON:  06/15/2018 2:02 p.m.  05/07/2018. FINDINGS: Brain: Broad-based bilateral subdural hematomas measuring up to 13 mm on the right and 11 mm on left. Local mass effect without midline shift. Focal blood anterior right middle cranial fossa measuring up to 11 mm. Chronic microvascular changes. Global atrophy. No intracranial mass lesion noted on this unenhanced exam. Vascular: Vascular calcifications Skull: No skull fracture. Sinuses/Orbits: Post lens replacement. No acute orbital abnormality. Partial opacification left sphenoid sinus. Other: Mastoid air cells and middle ear cavities are clear. IMPRESSION: 1. Overall no significant change since exam performed earlier today although significant change compared to 05/07/2018. 2. Broad-based bilateral subdural hematomas measuring up to 13 mm on the right and 11 mm on left. Local mass effect without midline shift. 3. Focal blood collection anterior right middle cranial fossa measuring up to 11 mm. This may simply represent clotted blood but is immediately adjacent to the right middle cerebral artery bifurcation and thrombosed aneurysm cannot be entirely excluded. 4. Chronic microvascular changes. Global atrophy. 5. Partial opacification left sphenoid sinus. Electronically Signed   By: Genia Del M.D.   On: 06/15/2018 19:48   Ct Cervical Spine Wo Contrast  Addendum Date: 07/01/2018   ADDENDUM REPORT: 07/01/2018 16:28 ADDENDUM: Critical Value/emergent results were called by telephone at the time of interpretation on 07/01/2018 at 1624 hours to Dr. Fredia Sorrow , who verbally acknowledged these results. Electronically Signed    By: Genevie Ann M.D.   On: 07/01/2018 16:28   Result Date: 07/01/2018 CLINICAL DATA:  84 year old female status post polytrauma. Bilateral subdural hematomas since earlier this month. EXAM: CT HEAD WITHOUT CONTRAST CT CERVICAL SPINE WITHOUT CONTRAST TECHNIQUE: Multidetector CT imaging of the head and cervical spine was performed following the standard protocol without intravenous contrast. Multiplanar CT image reconstructions of the cervical spine were also generated. COMPARISON:  06/15/2018 head CT and earlier, including cervical spine CT 05/07/2018. FINDINGS: CT HEAD FINDINGS Brain: Progressed right side mostly hypodense extra-axial collection and associated mass effect on the brain. The collection is now biconvex measuring up to 27 millimeters in thickness, with hyperdensity along the margins. See coronal image 29). There is associated leftward midline shift of 7 millimeters, new since 06/15/2018. At the same time the contralateral left mixed density subdural hematoma has decreased to 5-7 millimeters in thickness along the anterior left convexity. No intra-axial or intraventricular blood. Mass effect on the right lateral ventricle. Stable ventricle size otherwise. Basilar cisterns remain patent. No cortically based acute infarct identified. Vascular: Calcified atherosclerosis at the skull base. Skull: No skull fracture identified. Sinuses/Orbits: Small low-density fluid level in the left sphenoid sinus. Other Visualized paranasal sinuses and mastoids are stable and well pneumatized. Other: No acute orbit or scalp soft tissue findings. CT CERVICAL SPINE FINDINGS Alignment: Stable mild straightening. Cervicothoracic junction alignment is within normal limits. Bilateral posterior element alignment is within normal limits. Skull base and vertebrae:  Osteopenia. No acute cervical spine fracture. Soft tissues and spinal canal: No prevertebral fluid or swelling. No visible canal hematoma. Bulky left carotid calcified  atherosclerosis again noted. Disc levels: Stable. Advanced mid and lower cervical disc and endplate degeneration. Upper chest: Left posterior 1st through 3rd rib fractures with some periosteal reaction. These are relatively nondisplaced. Negative visible lung apices. Negative noncontrast thoracic inlet. Visible upper thoracic vertebral bodies appear to remain intact. IMPRESSION: 1. Progressed right side extra-axial collection since the subdural hematoma on 06/15/2018. This is now biconvex, measuring up to 27 mm in thickness, and associated with increased brain mass effect including 7 mm of leftward midline shift. 2. Contralateral left subdural hematoma has regressed, now 5-7 mm thickness. 3. Subacute appearing left posterior 1st through 3rd rib fractures with periosteal reaction. 4. Osteopenia.  No skull or cervical spine fracture identified. Electronically Signed: By: Genevie Ann M.D. On: 07/01/2018 16:20   US Renal  Result Date: 06/16/2018 CLINICAL DATA:  Acute kidney injury EXAM: RENAL / URINARY TRACT ULTRASOUND COMPLETE COMPARISON:  None. FINDINGS: Right Kidney: Renal measurements: 10.2 x 3.9 x 4.5 cm = volume: 91 mL. 2 cm lower pole cyst. Negative for hydronephrosis. Left Kidney: Renal measurements: 10.6 x 6.7 x 5.7 cm = volume: 212 mL. Renal size and volume measurement includes left renal cyst measuring 4 x 5 cm with layering debris. Negative for hydronephrosis. Bladder: Normal appearing bladder.  Bilateral ureteral jets. IMPRESSION: Normal renal size. Complex cyst left kidney 4 x 5 cm. Negative for hydronephrosis Electronically Signed   By: Franchot Gallo M.D.   On: 06/16/2018 10:31      LAB RESULTS: Basic Metabolic Panel: Recent Labs  Lab 07/08/18 0549 07/09/18 0417  NA 142 142  K 3.5 3.5  CL 107 105  CO2 26 27  GLUCOSE 121* 114*  BUN 7* 8  CREATININE 0.83 0.86  CALCIUM 8.8* 9.2   Liver Function Tests: No results for input(s): AST, ALT, ALKPHOS, BILITOT, PROT, ALBUMIN in the last 168  hours. No results for input(s): LIPASE, AMYLASE in the last 168 hours. No results for input(s): AMMONIA in the last 168 hours. CBC: Recent Labs  Lab 07/05/18 0430 07/08/18 0549  WBC 6.4 5.2  HGB 11.5* 12.3  HCT 34.9* 36.9  MCV 93.6 92.3  PLT 280 295   Cardiac Enzymes: No results for input(s): CKTOTAL, CKMB, CKMBINDEX, TROPONINI in the last 168 hours. BNP: Invalid input(s): POCBNP CBG: Recent Labs  Lab 07/08/18 2132 07/09/18 0608  GLUCAP 98 120*      Disposition and Follow-up: Discharge Instructions    Increase activity slowly   Complete by:  As directed        DISPOSITION: [facility]    Bianca Number, MD. Schedule an appointment as soon as possible for a visit in 2 week(s).   Specialty:  Internal Medicine Contact information: [place] [id number] [phone]        Kary Kos, MD. Schedule an appointment as soon as possible for a visit in 2 week(s).   Specialty:  Neurosurgery Contact information: [address] [place] [address] [phone]            Time coordinating discharge:  45 minutes  Signed:   Estill Cotta M.D. [facility] 07/09/2018, 11:24 AM Pager: [phone]

## 2018-07-08 NOTE — Social Work (Addendum)
CSW following, aware pt daughter was amenable to SNF placement on 12/22- pt previously residing at [facility].  Continuing to follow for medical stability.  F/u message left for Olivia Mackie at [facility] which was pt daughter first choice.  Westley Hummer, MSW, [place] Work [phone]

## 2018-07-09 DIAGNOSIS — S065X9A Traumatic subdural hemorrhage with loss of consciousness of unspecified duration, initial encounter: Secondary | ICD-10-CM | POA: Diagnosis not present

## 2018-07-09 DIAGNOSIS — G300 Alzheimer's disease with early onset: Secondary | ICD-10-CM | POA: Diagnosis not present

## 2018-07-09 DIAGNOSIS — E039 Hypothyroidism, unspecified: Secondary | ICD-10-CM | POA: Diagnosis not present

## 2018-07-09 DIAGNOSIS — I6200 Nontraumatic subdural hemorrhage, unspecified: Secondary | ICD-10-CM | POA: Diagnosis not present

## 2018-07-09 DIAGNOSIS — R410 Disorientation, unspecified: Secondary | ICD-10-CM | POA: Diagnosis not present

## 2018-07-09 DIAGNOSIS — E119 Type 2 diabetes mellitus without complications: Secondary | ICD-10-CM | POA: Diagnosis not present

## 2018-07-09 DIAGNOSIS — N179 Acute kidney failure, unspecified: Secondary | ICD-10-CM | POA: Diagnosis not present

## 2018-07-09 DIAGNOSIS — G9341 Metabolic encephalopathy: Secondary | ICD-10-CM | POA: Diagnosis not present

## 2018-07-09 DIAGNOSIS — S065X9D Traumatic subdural hemorrhage with loss of consciousness of unspecified duration, subsequent encounter: Secondary | ICD-10-CM | POA: Diagnosis not present

## 2018-07-09 DIAGNOSIS — Z7401 Bed confinement status: Secondary | ICD-10-CM | POA: Diagnosis not present

## 2018-07-09 DIAGNOSIS — R404 Transient alteration of awareness: Secondary | ICD-10-CM | POA: Diagnosis not present

## 2018-07-09 DIAGNOSIS — Z7189 Other specified counseling: Secondary | ICD-10-CM | POA: Diagnosis not present

## 2018-07-09 DIAGNOSIS — G309 Alzheimer's disease, unspecified: Secondary | ICD-10-CM | POA: Diagnosis not present

## 2018-07-09 DIAGNOSIS — F0280 Dementia in other diseases classified elsewhere without behavioral disturbance: Secondary | ICD-10-CM | POA: Diagnosis not present

## 2018-07-09 DIAGNOSIS — N3000 Acute cystitis without hematuria: Secondary | ICD-10-CM | POA: Diagnosis not present

## 2018-07-09 DIAGNOSIS — R58 Hemorrhage, not elsewhere classified: Secondary | ICD-10-CM | POA: Diagnosis not present

## 2018-07-09 DIAGNOSIS — R0902 Hypoxemia: Secondary | ICD-10-CM | POA: Diagnosis not present

## 2018-07-09 DIAGNOSIS — I10 Essential (primary) hypertension: Secondary | ICD-10-CM | POA: Diagnosis not present

## 2018-07-09 DIAGNOSIS — R4182 Altered mental status, unspecified: Secondary | ICD-10-CM | POA: Diagnosis not present

## 2018-07-09 DIAGNOSIS — M2550 Pain in unspecified joint: Secondary | ICD-10-CM | POA: Diagnosis not present

## 2018-07-09 DIAGNOSIS — E860 Dehydration: Secondary | ICD-10-CM | POA: Diagnosis not present

## 2018-07-09 DIAGNOSIS — R4702 Dysphasia: Secondary | ICD-10-CM | POA: Diagnosis not present

## 2018-07-09 LAB — BASIC METABOLIC PANEL
Anion gap: 10 (ref 5–15)
BUN: 8 mg/dL (ref 8–23)
CO2: 27 mmol/L (ref 22–32)
Calcium: 9.2 mg/dL (ref 8.9–10.3)
Chloride: 105 mmol/L (ref 98–111)
Creatinine, Ser: 0.86 mg/dL (ref 0.44–1.00)
GFR calc Af Amer: >60 mL/min (ref >60)
GFR calc non Af Amer: >60 mL/min (ref >60)
Glucose, Bld: 114 mg/dL — ABNORMAL HIGH (ref 70–99)
Potassium: 3.5 mmol/L (ref 3.5–5.1)
Sodium: 142 mmol/L (ref 135–145)

## 2018-07-09 LAB — GLUCOSE, CAPILLARY
Glucose-Capillary: 120 mg/dL — ABNORMAL HIGH (ref 70–99)
Glucose-Capillary: 113 mg/dL — ABNORMAL HIGH (ref 70–99)

## 2018-07-09 NOTE — Progress Notes (Signed)
CSW following for discharge plan. CSW contacted [facility] and left a voicemail to ask about status of referral. Per Network engineer, admissions liaison is out of the office, as well as the nurse who typically covers admissions. CSW left voicemail for a TEFL teacher.   CSW to follow.  Laveda Abbe, [place] Clinical Social Worker [phone]

## 2018-07-09 NOTE — Care Management Important Message (Signed)
Important Message  Patient Details  Name: Bianca Wilson MRN: [id number] Date of Birth: 03-28-1931   Medicare Important Message Given:  Yes    [name] 07/09/2018, 4:43 PM

## 2018-07-09 NOTE — Clinical Social Work Placement (Signed)
Nurse to call report to [phone], Room 714      CLINICAL SOCIAL WORK PLACEMENT  NOTE  Date:  07/09/2018  Patient Details  Name: Bianca Wilson MRN: [id number] Date of Birth: [birth year]  Clinical Social Work is seeking post-discharge placement for this patient at the [place] level of care (*CSW will initial, date and re-position this form in  chart as items are completed):  Yes   Patient/family provided with [facility] Work Department's list of facilities offering this level of care within the geographic area requested by the patient (or if unable, by the patient's family).  Yes   Patient/family informed of their freedom to choose among providers that offer the needed level of care, that participate in Medicare, Medicaid or managed care program needed by the patient, have an available bed and are willing to accept the patient.  Yes   Patient/family informed of [facility]'s ownership interest in [facility] and [facility], as well as of the fact that they are under no obligation to receive care at these facilities.  PASRR submitted to EDS on       PASRR number received on       Existing PASRR number confirmed on       FL2 transmitted to all facilities in geographic area requested by pt/family on       FL2 transmitted to all facilities within larger geographic area on       Patient informed that his/her managed care company has contracts with or will negotiate with certain facilities, including the following:        Yes   Patient/family informed of bed offers received.  Patient chooses bed at [place], [facility]     Physician recommends and patient chooses bed at      Patient to be transferred to [facility] on 07/09/18.  Patient to be transferred to facility by [facility]     Patient family notified on 07/09/18 of transfer.  Name of family member notified:  [name]     PHYSICIAN       Additional Comment:     _______________________________________________ Geralynn Ochs, LCSW 07/09/2018, 1:09 PM

## 2018-07-09 NOTE — Progress Notes (Signed)
Physical Therapy Treatment Patient Details Name: Bianca Wilson MRN: [id number] DOB: 1931-04-12 Today's Date: 07/09/2018    History of Present Illness Pt is an 84 y/o female admitted from SNF secondary to sustaining a fall. Of note, pt was hospitalized 2 weeks ago for a fall, at which time she had bilateral SDHs.  A new CTH was obtained since she was found down, which showed an expansion of the R SDH. PMH including but not limited to Alzheimer's.    PT Comments    Pt performed transfer training including multiple bouts of rolling and standing trials.  Pt slow and required increased time to follow commands.  Pt to d/c to SNF and daughter at bedside this pm.  Pt continues to benefit from skilled rehab in a post acute setting to improve strength and function before returning home.  Pt unable to participate in goal setting and d/c planning due to cognition.  Daughter recognizes her care needs cannot be met at home and is agreeable to rehab at [facility].    Follow Up Recommendations  SNF     Equipment Recommendations  None recommended by PT    Recommendations for Other Services       Precautions / Restrictions Precautions Precautions: Fall Precaution Comments: severe alzheimers Restrictions Weight Bearing Restrictions: No    Mobility  Bed Mobility Overal bed mobility: Needs Assistance Bed Mobility: Supine to Sit;Sit to Supine;Rolling Rolling: Mod assist;+2 for physical assistance   Supine to sit: Max assist;+2 for physical assistance Sit to supine: Max assist;+2 for physical assistance   General bed mobility comments: Pt performed rolling to R and to L for perianal care for brief and pad placement. Pt performed modified bridge to donn pants.  Pt Performed supine to sit  x2 with cues for hand placement.  Once in sitting patient with anterior lean and L lateral lean.  Pt is slow and guarded.    Transfers Overall transfer level: Needs assistance Equipment used: None(gave patient the back  of a chair to hold on to with R hand.  ) Transfers: Sit to/from [facility] Sit to Stand: Mod assist;+2 physical assistance         General transfer comment: Pt required assistance to extend B hips and trunk during standing trials.  Pt required assistance to avoid L lateral lean.    Ambulation/Gait Ambulation/Gait assistance: (NT)               Stairs             Wheelchair Mobility    Modified Rankin (Stroke Patients Only)       Balance Overall balance assessment: Needs assistance   Sitting balance-Leahy Scale: Poor       Standing balance-Leahy Scale: Poor                              Cognition Arousal/Alertness: Lethargic Behavior During Therapy: Flat affect Overall Cognitive Status: History of cognitive impairments - at baseline                                 General Comments: pt typically only oriented to self, decreased safety awareness, and insight to deficits      Exercises      General Comments        Pertinent Vitals/Pain Pain Assessment: Faces Faces Pain Scale: No hurt    Home Living  Prior Function            PT Goals (current goals can now be found in the care plan section) Acute Rehab PT Goals Patient Stated Goal: pt unable to state secondary to lethargy Potential to Achieve Goals: Good Progress towards PT goals: Progressing toward goals    Frequency    Min 2X/week      PT Plan Current plan remains appropriate    Co-evaluation              AM-PAC PT "6 Clicks" Mobility   Outcome Measure  Help needed turning from your back to your side while in a flat bed without using bedrails?: A Lot Help needed moving from lying on your back to sitting on the side of a flat bed without using bedrails?: A Lot Help needed moving to and from a bed to a chair (including a wheelchair)?: A Lot Help needed standing up from a chair using your arms (e.g.,  wheelchair or bedside chair)?: A Lot Help needed to walk in hospital room?: Total Help needed climbing 3-5 steps with a railing? : Total 6 Click Score: 10    End of Session Equipment Utilized During Treatment: Gait belt Activity Tolerance: Patient limited by lethargy;Patient tolerated treatment well Patient left: in chair;with call bell/phone within reach;with chair alarm set;with family/visitor present Nurse Communication: Mobility status PT Visit Diagnosis: Other abnormalities of gait and mobility (R26.89);Muscle weakness (generalized) (M62.81);Other symptoms and signs involving the nervous system (R29.898)     Time: 3785-8850 PT Time Calculation (min) (ACUTE ONLY): 19 min  Charges:  $Therapeutic Activity: 8-22 mins                     Governor Rooks, PTA Acute Rehabilitation Services Pager [phone] Office [phone]     Keldrick Pomplun Eli Hose 07/09/2018, 4:59 PM

## 2018-07-09 NOTE — Progress Notes (Signed)
Pt d/c to [facility]. Pt is stable with no new concerns. D/c instructions given to receiving RN Janett Billow. Pt will be transported out of hospital by [facility]

## 2018-07-09 NOTE — Progress Notes (Signed)
  Speech Language Pathology Treatment: Dysphagia  Patient Details Name: Bianca Wilson MRN: [id number] DOB: 1931/06/05 Today's Date: 07/09/2018 Time: 1012-1026 SLP Time Calculation (min) (ACUTE ONLY): 14 min  Assessment / Plan / Recommendation Clinical Impression  Pt is alert, daughter Bianca Wilson present at bedside.  Pt's swallowing function is stable - she is tolerating a dysphagia 1 diet with nectar thick liquids, which appears to be the safest diet and minimizes opportunities for aspiration.  Her daughter would like pt to remain on this diet at D/C.  We reviewed best feeding strategies, as well as the ongoing likelihood of aspiration that accompanies a dementia dx.  Pt requires total assist for feeding.    No further acute care SLP f/u needed; our service will sign off.    HPI HPI: 84 y.o. female with medical history significant for Alzheimer's dementia, recent hospital admission for bilateral subdural hematoma patient was admitted 06/16/2018 and discharged the same day as family did not want surgery so she was discharged back to assisted living facility.  Brought to ED with concern for CVA, fall. CT of the head 07/01/18 shows progression of right subdural hematoma compared to 06/15/2018.  This measures up to 27 mm in thickness associated with increased brain mass-effect including 7 mm leftward midline shift.  Contralateral left subdural hematoma has regressed to 5 to 7 mm thickness      SLP Plan  Goals met; no further acute rehab SLP       Recommendations  Diet recommendations: Dysphagia 1 (puree);Nectar-thick liquid Liquids provided via: Cup;No straw Medication Administration: Crushed with puree Supervision: Trained caregiver to feed patient Compensations: Minimize environmental distractions;Slow rate;Small sips/bites;Lingual sweep for clearance of pocketing Postural Changes and/or Swallow Maneuvers: Seated upright 90 degrees;Upright 30-60 min after meal                Oral Care  Recommendations: Oral care BID Follow up Recommendations: Skilled Nursing facility Plan: D/C SLP       GO              Bianca Wilson, [place] CCC/SLP Acute Rehabilitation Services Office number [phone] Pager [phone]   Bianca Wilson 07/09/2018, 10:28 AM

## 2018-07-13 DIAGNOSIS — E860 Dehydration: Secondary | ICD-10-CM | POA: Diagnosis not present

## 2018-07-13 DIAGNOSIS — R410 Disorientation, unspecified: Secondary | ICD-10-CM | POA: Diagnosis not present

## 2018-07-13 DIAGNOSIS — N3000 Acute cystitis without hematuria: Secondary | ICD-10-CM | POA: Diagnosis not present

## 2018-07-13 DIAGNOSIS — I6200 Nontraumatic subdural hemorrhage, unspecified: Secondary | ICD-10-CM | POA: Diagnosis not present

## 2018-07-13 DIAGNOSIS — R4182 Altered mental status, unspecified: Secondary | ICD-10-CM | POA: Diagnosis not present

## 2018-07-18 DIAGNOSIS — I959 Hypotension, unspecified: Secondary | ICD-10-CM | POA: Diagnosis not present

## 2018-07-18 DIAGNOSIS — S065X9D Traumatic subdural hemorrhage with loss of consciousness of unspecified duration, subsequent encounter: Secondary | ICD-10-CM | POA: Diagnosis not present

## 2018-07-18 DIAGNOSIS — D518 Other vitamin B12 deficiency anemias: Secondary | ICD-10-CM | POA: Diagnosis not present

## 2018-07-18 DIAGNOSIS — R41841 Cognitive communication deficit: Secondary | ICD-10-CM | POA: Diagnosis not present

## 2018-07-18 DIAGNOSIS — Z888 Allergy status to other drugs, medicaments and biological substances status: Secondary | ICD-10-CM | POA: Diagnosis not present

## 2018-07-18 DIAGNOSIS — S065X0A Traumatic subdural hemorrhage without loss of consciousness, initial encounter: Secondary | ICD-10-CM | POA: Diagnosis not present

## 2018-07-18 DIAGNOSIS — I69054 Hemiplegia and hemiparesis following nontraumatic subarachnoid hemorrhage affecting left non-dominant side: Secondary | ICD-10-CM | POA: Diagnosis not present

## 2018-07-18 DIAGNOSIS — M6281 Muscle weakness (generalized): Secondary | ICD-10-CM | POA: Diagnosis not present

## 2018-07-18 DIAGNOSIS — R0902 Hypoxemia: Secondary | ICD-10-CM | POA: Diagnosis not present

## 2018-07-18 DIAGNOSIS — E039 Hypothyroidism, unspecified: Secondary | ICD-10-CM | POA: Diagnosis not present

## 2018-07-18 DIAGNOSIS — R293 Abnormal posture: Secondary | ICD-10-CM | POA: Diagnosis not present

## 2018-07-18 DIAGNOSIS — Z993 Dependence on wheelchair: Secondary | ICD-10-CM | POA: Diagnosis not present

## 2018-07-18 DIAGNOSIS — G309 Alzheimer's disease, unspecified: Secondary | ICD-10-CM | POA: Diagnosis present

## 2018-07-18 DIAGNOSIS — F329 Major depressive disorder, single episode, unspecified: Secondary | ICD-10-CM | POA: Diagnosis not present

## 2018-07-18 DIAGNOSIS — D559 Anemia due to enzyme disorder, unspecified: Secondary | ICD-10-CM | POA: Diagnosis not present

## 2018-07-18 DIAGNOSIS — G8194 Hemiplegia, unspecified affecting left nondominant side: Secondary | ICD-10-CM | POA: Diagnosis present

## 2018-07-18 DIAGNOSIS — R279 Unspecified lack of coordination: Secondary | ICD-10-CM | POA: Diagnosis not present

## 2018-07-18 DIAGNOSIS — Z743 Need for continuous supervision: Secondary | ICD-10-CM | POA: Diagnosis not present

## 2018-07-18 DIAGNOSIS — Z885 Allergy status to narcotic agent status: Secondary | ICD-10-CM | POA: Diagnosis not present

## 2018-07-18 DIAGNOSIS — R471 Dysarthria and anarthria: Secondary | ICD-10-CM | POA: Diagnosis not present

## 2018-07-18 DIAGNOSIS — N3090 Cystitis, unspecified without hematuria: Secondary | ICD-10-CM | POA: Diagnosis not present

## 2018-07-18 DIAGNOSIS — R633 Feeding difficulties: Secondary | ICD-10-CM | POA: Diagnosis not present

## 2018-07-18 DIAGNOSIS — S065X9A Traumatic subdural hemorrhage with loss of consciousness of unspecified duration, initial encounter: Secondary | ICD-10-CM | POA: Diagnosis present

## 2018-07-18 DIAGNOSIS — I6200 Nontraumatic subdural hemorrhage, unspecified: Secondary | ICD-10-CM | POA: Diagnosis not present

## 2018-07-18 DIAGNOSIS — R51 Headache: Secondary | ICD-10-CM | POA: Diagnosis not present

## 2018-07-18 DIAGNOSIS — R296 Repeated falls: Secondary | ICD-10-CM | POA: Diagnosis not present

## 2018-07-18 DIAGNOSIS — W19XXXA Unspecified fall, initial encounter: Secondary | ICD-10-CM | POA: Diagnosis present

## 2018-07-18 DIAGNOSIS — R29898 Other symptoms and signs involving the musculoskeletal system: Secondary | ICD-10-CM | POA: Diagnosis not present

## 2018-07-18 DIAGNOSIS — R1312 Dysphagia, oropharyngeal phase: Secondary | ICD-10-CM | POA: Diagnosis not present

## 2018-07-18 DIAGNOSIS — M79601 Pain in right arm: Secondary | ICD-10-CM | POA: Diagnosis not present

## 2018-07-18 DIAGNOSIS — E785 Hyperlipidemia, unspecified: Secondary | ICD-10-CM | POA: Diagnosis not present

## 2018-07-18 DIAGNOSIS — R278 Other lack of coordination: Secondary | ICD-10-CM | POA: Diagnosis not present

## 2018-07-18 DIAGNOSIS — I129 Hypertensive chronic kidney disease with stage 1 through stage 4 chronic kidney disease, or unspecified chronic kidney disease: Secondary | ICD-10-CM | POA: Diagnosis present

## 2018-07-18 DIAGNOSIS — B3741 Candidal cystitis and urethritis: Secondary | ICD-10-CM | POA: Diagnosis not present

## 2018-07-18 DIAGNOSIS — E119 Type 2 diabetes mellitus without complications: Secondary | ICD-10-CM | POA: Diagnosis not present

## 2018-07-18 DIAGNOSIS — M810 Age-related osteoporosis without current pathological fracture: Secondary | ICD-10-CM | POA: Diagnosis present

## 2018-07-18 DIAGNOSIS — R531 Weakness: Secondary | ICD-10-CM | POA: Diagnosis not present

## 2018-07-18 DIAGNOSIS — N179 Acute kidney failure, unspecified: Secondary | ICD-10-CM | POA: Diagnosis not present

## 2018-07-18 DIAGNOSIS — S0990XA Unspecified injury of head, initial encounter: Secondary | ICD-10-CM | POA: Diagnosis not present

## 2018-07-18 DIAGNOSIS — F0280 Dementia in other diseases classified elsewhere without behavioral disturbance: Secondary | ICD-10-CM | POA: Diagnosis present

## 2018-07-18 DIAGNOSIS — M25571 Pain in right ankle and joints of right foot: Secondary | ICD-10-CM | POA: Diagnosis not present

## 2018-07-18 DIAGNOSIS — M25561 Pain in right knee: Secondary | ICD-10-CM | POA: Diagnosis not present

## 2018-07-18 DIAGNOSIS — R569 Unspecified convulsions: Secondary | ICD-10-CM | POA: Diagnosis not present

## 2018-07-18 DIAGNOSIS — G9341 Metabolic encephalopathy: Secondary | ICD-10-CM | POA: Diagnosis not present

## 2018-07-18 DIAGNOSIS — E038 Other specified hypothyroidism: Secondary | ICD-10-CM | POA: Diagnosis not present

## 2018-07-18 DIAGNOSIS — M79661 Pain in right lower leg: Secondary | ICD-10-CM | POA: Diagnosis not present

## 2018-07-18 DIAGNOSIS — R4182 Altered mental status, unspecified: Secondary | ICD-10-CM | POA: Diagnosis not present

## 2018-07-18 DIAGNOSIS — M79631 Pain in right forearm: Secondary | ICD-10-CM | POA: Diagnosis not present

## 2018-07-18 DIAGNOSIS — I10 Essential (primary) hypertension: Secondary | ICD-10-CM | POA: Diagnosis not present

## 2018-07-18 DIAGNOSIS — E1149 Type 2 diabetes mellitus with other diabetic neurological complication: Secondary | ICD-10-CM | POA: Diagnosis not present

## 2018-07-18 DIAGNOSIS — E1122 Type 2 diabetes mellitus with diabetic chronic kidney disease: Secondary | ICD-10-CM | POA: Diagnosis present

## 2018-07-18 DIAGNOSIS — R410 Disorientation, unspecified: Secondary | ICD-10-CM | POA: Diagnosis not present

## 2018-07-18 DIAGNOSIS — N183 Chronic kidney disease, stage 3 (moderate): Secondary | ICD-10-CM | POA: Diagnosis present

## 2018-07-18 DIAGNOSIS — R4702 Dysphasia: Secondary | ICD-10-CM | POA: Diagnosis not present

## 2018-07-19 DIAGNOSIS — N179 Acute kidney failure, unspecified: Secondary | ICD-10-CM | POA: Diagnosis not present

## 2018-07-19 DIAGNOSIS — N183 Chronic kidney disease, stage 3 (moderate): Secondary | ICD-10-CM | POA: Diagnosis not present

## 2018-07-19 DIAGNOSIS — G9341 Metabolic encephalopathy: Secondary | ICD-10-CM | POA: Diagnosis not present

## 2018-07-19 DIAGNOSIS — S065X9A Traumatic subdural hemorrhage with loss of consciousness of unspecified duration, initial encounter: Secondary | ICD-10-CM | POA: Diagnosis not present

## 2018-07-25 DIAGNOSIS — E039 Hypothyroidism, unspecified: Secondary | ICD-10-CM | POA: Diagnosis not present

## 2018-07-25 DIAGNOSIS — I10 Essential (primary) hypertension: Secondary | ICD-10-CM | POA: Diagnosis not present

## 2018-07-25 DIAGNOSIS — E119 Type 2 diabetes mellitus without complications: Secondary | ICD-10-CM | POA: Diagnosis not present

## 2018-07-25 DIAGNOSIS — G309 Alzheimer's disease, unspecified: Secondary | ICD-10-CM | POA: Diagnosis not present

## 2018-07-26 DIAGNOSIS — S065X9A Traumatic subdural hemorrhage with loss of consciousness of unspecified duration, initial encounter: Secondary | ICD-10-CM | POA: Diagnosis not present

## 2018-07-26 DIAGNOSIS — N179 Acute kidney failure, unspecified: Secondary | ICD-10-CM | POA: Diagnosis not present

## 2018-07-26 DIAGNOSIS — N183 Chronic kidney disease, stage 3 (moderate): Secondary | ICD-10-CM | POA: Diagnosis not present

## 2018-07-26 DIAGNOSIS — G9341 Metabolic encephalopathy: Secondary | ICD-10-CM | POA: Diagnosis not present

## 2018-07-29 ENCOUNTER — Other Ambulatory Visit: Payer: Self-pay | Admitting: Neurological Surgery

## 2018-07-29 DIAGNOSIS — I10 Essential (primary) hypertension: Secondary | ICD-10-CM | POA: Diagnosis not present

## 2018-08-02 DIAGNOSIS — E119 Type 2 diabetes mellitus without complications: Secondary | ICD-10-CM | POA: Diagnosis not present

## 2018-08-02 DIAGNOSIS — N183 Chronic kidney disease, stage 3 (moderate): Secondary | ICD-10-CM | POA: Diagnosis not present

## 2018-08-02 DIAGNOSIS — N179 Acute kidney failure, unspecified: Secondary | ICD-10-CM | POA: Diagnosis not present

## 2018-08-02 DIAGNOSIS — E785 Hyperlipidemia, unspecified: Secondary | ICD-10-CM | POA: Diagnosis not present

## 2018-08-03 ENCOUNTER — Encounter (HOSPITAL_COMMUNITY): Payer: Self-pay | Admitting: *Deleted

## 2018-08-03 NOTE — Pre-Procedure Instructions (Signed)
   Bianca Wilson  08/03/2018     [name]'s procedure is scheduled on Wednesday, 08/04/18 at 12:30 PM.   Report to [facility] Entrance "A" Admitting Office at 10:00 AM.   Call this number if you have problems the morning of surgery: [phone]   Remember:  Patient is not to eat or drink after midnight tonight.  Take these medicines the morning of surgery with A SIP OF WATER: Amlodipine (Norvasc), Escitalopram (Lexapro), Levetiracetam (Keppra), Levothyroxine (Synthroid), Memantine (Namenda), Tylenol - if needed.  In the AM please check pt's blood sugar when she gets up and every 2 hours until she leaves for the hospital. If blood sugar is >220 take 1/2 of usual correction dose of Novolog insulin. If blood sugar is 70 or below, treat with 1/2 cup of clear juice (apple or cranberry) and recheck blood sugar 15 minutes after drinking juice. If blood sugar continues to be 70 or below, call the Short Stay department and ask to speak to a nurse.  Pt is not to take her Metformin in the AM.  Per Dr. Jefm Petty, Anesthesiologist/Beth Spackman Stan Head, RN    Do not wear jewelry, make-up or nail polish.  Do not wear lotions, powders, perfumes or deodorant.  Do not shave 48 hours prior to surgery.    Do not bring valuables to the hospital.  [facility] is not responsible for any belongings or valuables.  Contacts, dentures or bridgework may not be worn into surgery.  Leave your suitcase in the car.  After surgery it may be brought to your room.  For patients admitted to the hospital, discharge time will be determined by your treatment team.  Any questions this afternoon, please call me, Lilia Pro at [phone]

## 2018-08-03 NOTE — Progress Notes (Signed)
Pt is a resident at [facility] in [place]. Spoke with Ginger, RN for pre-op call. She verified pt's medical history. She states pt does have dementia. No cardiac history noted. Pt is a type 2 diabetic. Ginger could not find pt's most recent A1C or what her fasting blood sugars have been. Faxed detailed pre-op instructions to Ginger at [phone]. I also called pt's daughter, [name] and spoke with her. She states she is patient's Medical Power of attorney. I asked her if she could bring those papers with her in the AM. She will be meeting pt here. She states pt can follow directions if you get her attention first. She states  Pt's left side is ""working better" since December.

## 2018-08-04 ENCOUNTER — Encounter (HOSPITAL_COMMUNITY)
Admission: RE | Disposition: A | Discharge status: Skilled Nursing Facility | Payer: Self-pay | Source: Home / Self Care | Attending: Neurological Surgery

## 2018-08-04 ENCOUNTER — Other Ambulatory Visit: Payer: Self-pay

## 2018-08-04 ENCOUNTER — Inpatient Hospital Stay (HOSPITAL_COMMUNITY)
Admission: RE | Admit: 2018-08-04 | Discharge: 2018-08-05 | DRG: 026 | Disposition: A | Discharge status: Skilled Nursing Facility | Payer: MEDICARE | Attending: Neurological Surgery | Admitting: Neurological Surgery

## 2018-08-04 ENCOUNTER — Inpatient Hospital Stay (HOSPITAL_COMMUNITY): Payer: MEDICARE

## 2018-08-04 DIAGNOSIS — N183 Chronic kidney disease, stage 3 (moderate): Secondary | ICD-10-CM | POA: Diagnosis present

## 2018-08-04 DIAGNOSIS — Z888 Allergy status to other drugs, medicaments and biological substances status: Secondary | ICD-10-CM

## 2018-08-04 DIAGNOSIS — E1122 Type 2 diabetes mellitus with diabetic chronic kidney disease: Secondary | ICD-10-CM | POA: Diagnosis present

## 2018-08-04 DIAGNOSIS — E039 Hypothyroidism, unspecified: Secondary | ICD-10-CM | POA: Diagnosis not present

## 2018-08-04 DIAGNOSIS — F0280 Dementia in other diseases classified elsewhere without behavioral disturbance: Secondary | ICD-10-CM | POA: Diagnosis present

## 2018-08-04 DIAGNOSIS — S065X0A Traumatic subdural hemorrhage without loss of consciousness, initial encounter: Secondary | ICD-10-CM | POA: Diagnosis not present

## 2018-08-04 DIAGNOSIS — R29898 Other symptoms and signs involving the musculoskeletal system: Secondary | ICD-10-CM | POA: Diagnosis not present

## 2018-08-04 DIAGNOSIS — G309 Alzheimer's disease, unspecified: Secondary | ICD-10-CM | POA: Diagnosis present

## 2018-08-04 DIAGNOSIS — M810 Age-related osteoporosis without current pathological fracture: Secondary | ICD-10-CM | POA: Diagnosis present

## 2018-08-04 DIAGNOSIS — S065X9A Traumatic subdural hemorrhage with loss of consciousness of unspecified duration, initial encounter: Principal | ICD-10-CM | POA: Diagnosis present

## 2018-08-04 DIAGNOSIS — G8194 Hemiplegia, unspecified affecting left nondominant side: Secondary | ICD-10-CM | POA: Diagnosis present

## 2018-08-04 DIAGNOSIS — W19XXXA Unspecified fall, initial encounter: Secondary | ICD-10-CM | POA: Diagnosis present

## 2018-08-04 DIAGNOSIS — I959 Hypotension, unspecified: Secondary | ICD-10-CM | POA: Diagnosis not present

## 2018-08-04 DIAGNOSIS — E119 Type 2 diabetes mellitus without complications: Secondary | ICD-10-CM | POA: Diagnosis not present

## 2018-08-04 DIAGNOSIS — Z885 Allergy status to narcotic agent status: Secondary | ICD-10-CM

## 2018-08-04 DIAGNOSIS — Z743 Need for continuous supervision: Secondary | ICD-10-CM | POA: Diagnosis not present

## 2018-08-04 DIAGNOSIS — R279 Unspecified lack of coordination: Secondary | ICD-10-CM | POA: Diagnosis not present

## 2018-08-04 DIAGNOSIS — I129 Hypertensive chronic kidney disease with stage 1 through stage 4 chronic kidney disease, or unspecified chronic kidney disease: Secondary | ICD-10-CM | POA: Diagnosis present

## 2018-08-04 DIAGNOSIS — R0902 Hypoxemia: Secondary | ICD-10-CM | POA: Diagnosis not present

## 2018-08-04 DIAGNOSIS — S065XAA Traumatic subdural hemorrhage with loss of consciousness status unknown, initial encounter: Secondary | ICD-10-CM | POA: Diagnosis present

## 2018-08-04 DIAGNOSIS — I6200 Nontraumatic subdural hemorrhage, unspecified: Secondary | ICD-10-CM | POA: Diagnosis not present

## 2018-08-04 DIAGNOSIS — Z993 Dependence on wheelchair: Secondary | ICD-10-CM

## 2018-08-04 DIAGNOSIS — I10 Essential (primary) hypertension: Secondary | ICD-10-CM | POA: Diagnosis not present

## 2018-08-04 HISTORY — PX: BURR HOLE: SHX908

## 2018-08-04 HISTORY — DX: Chronic kidney disease, unspecified: N18.9

## 2018-08-04 HISTORY — DX: Essential (primary) hypertension: I10

## 2018-08-04 HISTORY — DX: Dysphagia, unspecified: R13.10

## 2018-08-04 HISTORY — DX: Age-related osteoporosis without current pathological fracture: M81.0

## 2018-08-04 HISTORY — DX: Type 2 diabetes mellitus without complications: E11.9

## 2018-08-04 LAB — GLUCOSE, CAPILLARY
GLUCOSE-CAPILLARY: 173 mg/dL — AB (ref 70–99)
Glucose-Capillary: 185 mg/dL — ABNORMAL HIGH (ref 70–99)
Glucose-Capillary: 153 mg/dL — ABNORMAL HIGH (ref 70–99)
Glucose-Capillary: 254 mg/dL — ABNORMAL HIGH (ref 70–99)

## 2018-08-04 LAB — TYPE AND SCREEN
ABO/RH(D): O POS
Antibody Screen: NEGATIVE

## 2018-08-04 LAB — CBC
HCT: 43.4 % (ref 36.0–46.0)
HEMOGLOBIN: 13.6 g/dL (ref 12.0–15.0)
MCH: 30.1 pg (ref 26.0–34.0)
MCHC: 31.3 g/dL (ref 30.0–36.0)
MCV: 96 fL (ref 80.0–100.0)
Platelets: 237 10*3/uL (ref 150–400)
RBC: 4.52 MIL/uL (ref 3.87–5.11)
RDW: 13.1 % (ref 11.5–15.5)
WBC: 6.6 10*3/uL (ref 4.0–10.5)
nRBC: 0 % (ref 0.0–0.2)

## 2018-08-04 LAB — BASIC METABOLIC PANEL
Anion gap: 10 (ref 5–15)
BUN: 19 mg/dL (ref 8–23)
CHLORIDE: 108 mmol/L (ref 98–111)
CO2: 27 mmol/L (ref 22–32)
Calcium: 9.6 mg/dL (ref 8.9–10.3)
Creatinine, Ser: 0.96 mg/dL (ref 0.44–1.00)
GFR calc Af Amer: >60 mL/min (ref >60)
GFR calc non Af Amer: 54 mL/min — ABNORMAL LOW (ref >60)
Glucose, Bld: 197 mg/dL — ABNORMAL HIGH (ref 70–99)
Potassium: 4.2 mmol/L (ref 3.5–5.1)
Sodium: 145 mmol/L (ref 135–145)

## 2018-08-04 LAB — ABO/RH: ABO/RH(D): O POS

## 2018-08-04 LAB — MRSA PCR SCREENING: MRSA by PCR: NEGATIVE

## 2018-08-04 SURGERY — CREATION, CRANIAL BURR HOLE
Anesthesia: General | Site: Head | Laterality: Right

## 2018-08-04 MED ORDER — LEVETIRACETAM 500 MG PO TABS
1000.0000 mg | ORAL_TABLET | Freq: Two times a day (BID) | ORAL | Status: DC
  Filled 2018-08-04: qty 2

## 2018-08-04 MED ORDER — PROPOFOL 10 MG/ML IV BOLUS
INTRAVENOUS | Status: AC
  Filled 2018-08-04: qty 20

## 2018-08-04 MED ORDER — ONDANSETRON HCL 4 MG PO TABS: 4.0000 mg | ORAL_TABLET | ORAL | Status: DC | PRN

## 2018-08-04 MED ORDER — ONDANSETRON HCL 4 MG/2ML IJ SOLN: 4.0000 mg | INTRAMUSCULAR | Status: DC | PRN

## 2018-08-04 MED ORDER — LACTATED RINGERS IV SOLN
INTRAVENOUS | Status: DC | PRN
  Administered 2018-08-04: 17:00:00 via INTRAVENOUS

## 2018-08-04 MED ORDER — LIDOCAINE-EPINEPHRINE 1 %-1:100000 IJ SOLN
INTRAMUSCULAR | Status: AC
  Filled 2018-08-04: qty 1

## 2018-08-04 MED ORDER — SUGAMMADEX SODIUM 200 MG/2ML IV SOLN
INTRAVENOUS | Status: DC | PRN
  Administered 2018-08-04: 300 mg via INTRAVENOUS

## 2018-08-04 MED ORDER — ROCURONIUM BROMIDE 50 MG/5ML IV SOSY
PREFILLED_SYRINGE | INTRAVENOUS | Status: AC
  Filled 2018-08-04: qty 5

## 2018-08-04 MED ORDER — ESCITALOPRAM OXALATE 20 MG PO TABS
20.0000 mg | ORAL_TABLET | Freq: Every day | ORAL | Status: DC
  Administered 2018-08-04 – 2018-08-05 (×2): 20 mg via ORAL
  Filled 2018-08-04 (×2): qty 1
  Filled 2018-08-04 (×2): qty 2

## 2018-08-04 MED ORDER — CEFAZOLIN SODIUM-DEXTROSE 2-4 GM/100ML-% IV SOLN
INTRAVENOUS | Status: AC
  Filled 2018-08-04: qty 100

## 2018-08-04 MED ORDER — ONDANSETRON HCL 4 MG/2ML IJ SOLN
INTRAMUSCULAR | Status: AC
  Filled 2018-08-04: qty 2

## 2018-08-04 MED ORDER — FENTANYL CITRATE (PF) 250 MCG/5ML IJ SOLN
INTRAMUSCULAR | Status: DC | PRN
  Administered 2018-08-04: 50 ug via INTRAVENOUS

## 2018-08-04 MED ORDER — ATORVASTATIN CALCIUM 10 MG PO TABS
20.0000 mg | ORAL_TABLET | Freq: Every day | ORAL | Status: DC
  Administered 2018-08-04: 20 mg via ORAL
  Filled 2018-08-04: qty 2

## 2018-08-04 MED ORDER — MEMANTINE HCL 10 MG PO TABS
10.0000 mg | ORAL_TABLET | Freq: Two times a day (BID) | ORAL | Status: DC
  Administered 2018-08-04: 10 mg via ORAL
  Filled 2018-08-04 (×3): qty 1

## 2018-08-04 MED ORDER — EPHEDRINE SULFATE 50 MG/ML IJ SOLN
INTRAMUSCULAR | Status: DC | PRN
  Administered 2018-08-04: 10 mg via INTRAVENOUS

## 2018-08-04 MED ORDER — LIDOCAINE 2% (20 MG/ML) 5 ML SYRINGE
INTRAMUSCULAR | Status: AC
  Filled 2018-08-04: qty 5

## 2018-08-04 MED ORDER — THROMBIN 5000 UNITS EX SOLR
OROMUCOSAL | Status: DC | PRN
  Administered 2018-08-04: 18:00:00 via TOPICAL

## 2018-08-04 MED ORDER — ONDANSETRON HCL 4 MG PO TABS: 4.0000 mg | ORAL_TABLET | Freq: Three times a day (TID) | ORAL | Status: DC | PRN

## 2018-08-04 MED ORDER — LIDOCAINE-EPINEPHRINE 1 %-1:100000 IJ SOLN
INTRAMUSCULAR | Status: DC | PRN
  Administered 2018-08-04: 7 mL

## 2018-08-04 MED ORDER — DEXAMETHASONE SODIUM PHOSPHATE 10 MG/ML IJ SOLN
INTRAMUSCULAR | Status: AC
  Filled 2018-08-04: qty 1

## 2018-08-04 MED ORDER — ESMOLOL HCL 100 MG/10ML IV SOLN
INTRAVENOUS | Status: DC | PRN
  Administered 2018-08-04: 30 mg via INTRAVENOUS

## 2018-08-04 MED ORDER — CHLORHEXIDINE GLUCONATE CLOTH 2 % EX PADS: 6.0000 | MEDICATED_PAD | Freq: Once | CUTANEOUS | Status: DC

## 2018-08-04 MED ORDER — LEVETIRACETAM 100 MG/ML PO SOLN
1000.0000 mg | Freq: Two times a day (BID) | ORAL | Status: DC
  Administered 2018-08-04: 1000 mg via ORAL
  Filled 2018-08-04: qty 10

## 2018-08-04 MED ORDER — INSULIN ASPART 100 UNIT/ML ~~LOC~~ SOLN
0.0000 [IU] | Freq: Three times a day (TID) | SUBCUTANEOUS | Status: DC
  Administered 2018-08-05: 5 [IU] via SUBCUTANEOUS

## 2018-08-04 MED ORDER — LIDOCAINE 2% (20 MG/ML) 5 ML SYRINGE
INTRAMUSCULAR | Status: DC | PRN
  Administered 2018-08-04: 50 mg via INTRAVENOUS

## 2018-08-04 MED ORDER — HYDROCODONE-ACETAMINOPHEN 5-325 MG PO TABS: 1.0000 | ORAL_TABLET | ORAL | Status: DC | PRN

## 2018-08-04 MED ORDER — DEXAMETHASONE SODIUM PHOSPHATE 10 MG/ML IJ SOLN
INTRAMUSCULAR | Status: DC | PRN
  Administered 2018-08-04: 10 mg via INTRAVENOUS

## 2018-08-04 MED ORDER — OXYCODONE-ACETAMINOPHEN 5-325 MG PO TABS: 1.0000 | ORAL_TABLET | ORAL | Status: DC | PRN

## 2018-08-04 MED ORDER — 0.9 % SODIUM CHLORIDE (POUR BTL) OPTIME
TOPICAL | Status: DC | PRN
  Administered 2018-08-04 (×2): 1000 mL

## 2018-08-04 MED ORDER — BACITRACIN ZINC 500 UNIT/GM EX OINT
TOPICAL_OINTMENT | CUTANEOUS | Status: AC
  Filled 2018-08-04: qty 28.35

## 2018-08-04 MED ORDER — ONDANSETRON HCL 4 MG/2ML IJ SOLN
INTRAMUSCULAR | Status: DC | PRN
  Administered 2018-08-04: 4 mg via INTRAVENOUS

## 2018-08-04 MED ORDER — PROPOFOL 10 MG/ML IV BOLUS
INTRAVENOUS | Status: DC | PRN
  Administered 2018-08-04: 120 mg via INTRAVENOUS

## 2018-08-04 MED ORDER — DONEPEZIL HCL 10 MG PO TABS
10.0000 mg | ORAL_TABLET | Freq: Every day | ORAL | Status: DC
  Administered 2018-08-04: 10 mg via ORAL
  Filled 2018-08-04: qty 1

## 2018-08-04 MED ORDER — PHENYLEPHRINE 40 MCG/ML (10ML) SYRINGE FOR IV PUSH (FOR BLOOD PRESSURE SUPPORT)
PREFILLED_SYRINGE | INTRAVENOUS | Status: AC
  Filled 2018-08-04: qty 10

## 2018-08-04 MED ORDER — METFORMIN HCL ER 500 MG PO TB24
500.0000 mg | ORAL_TABLET | Freq: Every day | ORAL | Status: DC
  Administered 2018-08-05: 500 mg via ORAL
  Filled 2018-08-04: qty 1

## 2018-08-04 MED ORDER — METOPROLOL TARTRATE 5 MG/5ML IV SOLN
INTRAVENOUS | Status: AC
  Filled 2018-08-04: qty 5

## 2018-08-04 MED ORDER — ROCURONIUM BROMIDE 10 MG/ML (PF) SYRINGE
PREFILLED_SYRINGE | INTRAVENOUS | Status: DC | PRN
  Administered 2018-08-04: 40 mg via INTRAVENOUS
  Administered 2018-08-04: 10 mg via INTRAVENOUS

## 2018-08-04 MED ORDER — AMLODIPINE BESYLATE 10 MG PO TABS
10.0000 mg | ORAL_TABLET | Freq: Every day | ORAL | Status: DC
  Administered 2018-08-05: 10 mg via ORAL
  Filled 2018-08-04 (×2): qty 1

## 2018-08-04 MED ORDER — FENTANYL CITRATE (PF) 250 MCG/5ML IJ SOLN
INTRAMUSCULAR | Status: AC
  Filled 2018-08-04: qty 5

## 2018-08-04 MED ORDER — THROMBIN 5000 UNITS EX SOLR
CUTANEOUS | Status: AC
  Filled 2018-08-04: qty 5000

## 2018-08-04 MED ORDER — ENSURE ENLIVE PO LIQD
237.0000 mL | Freq: Two times a day (BID) | ORAL | Status: DC
  Administered 2018-08-05: 237 mL via ORAL

## 2018-08-04 MED ORDER — CEFAZOLIN SODIUM-DEXTROSE 2-4 GM/100ML-% IV SOLN
2.0000 g | INTRAVENOUS | Status: AC
  Administered 2018-08-04: 2 g via INTRAVENOUS

## 2018-08-04 MED ORDER — LEVOTHYROXINE SODIUM 25 MCG PO TABS
25.0000 ug | ORAL_TABLET | Freq: Every day | ORAL | Status: DC
  Administered 2018-08-05: 25 ug via ORAL
  Filled 2018-08-04: qty 1

## 2018-08-04 MED ORDER — LABETALOL HCL 5 MG/ML IV SOLN: 10.0000 mg | INTRAVENOUS | Status: DC | PRN

## 2018-08-04 MED ORDER — DOCUSATE SODIUM 50 MG/5ML PO LIQD
100.0000 mg | Freq: Two times a day (BID) | ORAL | Status: DC
  Filled 2018-08-04: qty 10

## 2018-08-04 MED ORDER — DOCUSATE SODIUM 100 MG PO CAPS
100.0000 mg | ORAL_CAPSULE | Freq: Two times a day (BID) | ORAL | Status: DC
  Filled 2018-08-04: qty 1

## 2018-08-04 MED ORDER — BACITRACIN ZINC 500 UNIT/GM EX OINT
TOPICAL_OINTMENT | CUTANEOUS | Status: DC | PRN
  Administered 2018-08-04: 1 via TOPICAL

## 2018-08-04 MED ORDER — ESMOLOL HCL 100 MG/10ML IV SOLN
INTRAVENOUS | Status: AC
  Filled 2018-08-04: qty 10

## 2018-08-04 MED ORDER — PROMETHAZINE HCL 12.5 MG PO TABS
12.5000 mg | ORAL_TABLET | ORAL | Status: DC | PRN
  Filled 2018-08-04: qty 2

## 2018-08-04 SURGICAL SUPPLY — 66 items
BLADE CLIPPER SURG (BLADE) ×3 IMPLANT
BNDG GAUZE ELAST 4 BULKY (GAUZE/BANDAGES/DRESSINGS) IMPLANT
BUR ACORN 9.0 PRECISION (BURR) ×2 IMPLANT
BUR ACORN 9.0MM PRECISION (BURR) ×1
BUR MATCHSTICK NEURO 3.0 LAGG (BURR) IMPLANT
BUR SPIRAL ROUTER 2.3 (BUR) IMPLANT
BUR SPIRAL ROUTER 2.3MM (BUR)
CANISTER SUCT 3000ML PPV (MISCELLANEOUS) ×3 IMPLANT
CLIP VESOCCLUDE MED 6/CT (CLIP) IMPLANT
COVER WAND RF STERILE (DRAPES) IMPLANT
DRAPE NEUROLOGICAL W/INCISE (DRAPES) ×3 IMPLANT
DRAPE SHEET LG 3/4 BI-LAMINATE (DRAPES) IMPLANT
DRAPE SURG 17X23 STRL (DRAPES) IMPLANT
DRAPE WARM FLUID 44X44 (DRAPE) ×3 IMPLANT
DURAPREP 6ML APPLICATOR 50/CS (WOUND CARE) ×3 IMPLANT
ELECT REM PT RETURN 9FT ADLT (ELECTROSURGICAL) ×3
ELECTRODE REM PT RTRN 9FT ADLT (ELECTROSURGICAL) ×1 IMPLANT
EVACUATOR 1/8 PVC DRAIN (DRAIN) IMPLANT
EVACUATOR SILICONE 100CC (DRAIN) IMPLANT
GAUZE 4X4 16PLY RFD (DISPOSABLE) IMPLANT
GAUZE SPONGE 4X4 12PLY STRL (GAUZE/BANDAGES/DRESSINGS) IMPLANT
GLOVE BIO SURGEON STRL SZ7.5 (GLOVE) ×3 IMPLANT
GLOVE BIOGEL PI IND STRL 6.5 (GLOVE) ×1 IMPLANT
GLOVE BIOGEL PI IND STRL 7.5 (GLOVE) ×1 IMPLANT
GLOVE BIOGEL PI INDICATOR 6.5 (GLOVE) ×2
GLOVE BIOGEL PI INDICATOR 7.5 (GLOVE) ×2
GLOVE EXAM NITRILE LRG STRL (GLOVE) IMPLANT
GLOVE EXAM NITRILE XL STR (GLOVE) IMPLANT
GLOVE EXAM NITRILE XS STR PU (GLOVE) IMPLANT
GLOVE SURG SS PI 6.0 STRL IVOR (GLOVE) ×6 IMPLANT
GOWN STRL REUS W/ TWL LRG LVL3 (GOWN DISPOSABLE) ×2 IMPLANT
GOWN STRL REUS W/ TWL XL LVL3 (GOWN DISPOSABLE) IMPLANT
GOWN STRL REUS W/TWL 2XL LVL3 (GOWN DISPOSABLE) IMPLANT
GOWN STRL REUS W/TWL LRG LVL3 (GOWN DISPOSABLE) ×4
GOWN STRL REUS W/TWL XL LVL3 (GOWN DISPOSABLE)
HEMOSTAT POWDER KIT SURGIFOAM (HEMOSTASIS) ×3 IMPLANT
HEMOSTAT SURGICEL 2X14 (HEMOSTASIS) IMPLANT
HOOK DURA 1/2IN (MISCELLANEOUS) IMPLANT
KIT BASIN OR (CUSTOM PROCEDURE TRAY) ×3 IMPLANT
KIT TURNOVER KIT B (KITS) ×3 IMPLANT
NEEDLE HYPO 22GX1.5 SAFETY (NEEDLE) ×3 IMPLANT
NS IRRIG 1000ML POUR BTL (IV SOLUTION) ×6 IMPLANT
PACK CRANIOTOMY CUSTOM (CUSTOM PROCEDURE TRAY) ×3 IMPLANT
PATTIES SURGICAL .5 X.5 (GAUZE/BANDAGES/DRESSINGS) IMPLANT
PATTIES SURGICAL .5 X3 (DISPOSABLE) IMPLANT
PATTIES SURGICAL 1X1 (DISPOSABLE) IMPLANT
SPONGE NEURO XRAY DETECT 1X3 (DISPOSABLE) IMPLANT
SPONGE SURGIFOAM ABS GEL SZ50 (HEMOSTASIS) IMPLANT
STAPLER VISISTAT 35W (STAPLE) ×3 IMPLANT
STOCKINETTE 6  STRL (DRAPES)
STOCKINETTE 6 STRL (DRAPES) IMPLANT
SUT ETHILON 3 0 FSL (SUTURE) IMPLANT
SUT ETHILON 3 0 PS 1 (SUTURE) IMPLANT
SUT MNCRL AB 3-0 PS2 18 (SUTURE) ×3 IMPLANT
SUT NURALON 4 0 TR CR/8 (SUTURE) ×3 IMPLANT
SUT STEEL 0 (SUTURE)
SUT STEEL 0 18XMFL TIE 17 (SUTURE) IMPLANT
SUT VIC AB 0 CT1 18XCR BRD8 (SUTURE) ×1 IMPLANT
SUT VIC AB 0 CT1 8-18 (SUTURE) ×2
TOWEL GREEN STERILE (TOWEL DISPOSABLE) ×3 IMPLANT
TOWEL GREEN STERILE FF (TOWEL DISPOSABLE) ×3 IMPLANT
TRAY FOLEY MTR SLVR 16FR STAT (SET/KITS/TRAYS/PACK) ×3 IMPLANT
TUBE CONNECTING 12'X1/4 (SUCTIONS) ×1
TUBE CONNECTING 12X1/4 (SUCTIONS) ×2 IMPLANT
UNDERPAD 30X30 (UNDERPADS AND DIAPERS) IMPLANT
WATER STERILE IRR 1000ML POUR (IV SOLUTION) ×3 IMPLANT

## 2018-08-04 NOTE — Anesthesia Procedure Notes (Signed)
Procedure Name: Intubation Date/Time: 08/04/2018 6:40 PM Performed by: Teressa Lower., CRNA Pre-anesthesia Checklist: Patient identified, Emergency Drugs available, Suction available and Patient being monitored Patient Re-evaluated:Patient Re-evaluated prior to induction Oxygen Delivery Method: Circle system utilized Preoxygenation: Pre-oxygenation with 100% oxygen Induction Type: IV induction Ventilation: Mask ventilation without difficulty Laryngoscope Size: Mac and 3 Grade View: Grade I Tube type: Oral Tube size: 7.0 mm Number of attempts: 1 Airway Equipment and Method: Stylet Placement Confirmation: ETT inserted through vocal cords under direct vision,  positive ETCO2 and breath sounds checked- equal and bilateral Secured at: 21 cm Tube secured with: Tape Dental Injury: Teeth and Oropharynx as per pre-operative assessment

## 2018-08-04 NOTE — Brief Op Note (Signed)
08/04/2018  6:18 PM  PATIENT:  Bianca Wilson  85 y.o. female  PRE-OPERATIVE DIAGNOSIS:  Subdural hematoma  POST-OPERATIVE DIAGNOSIS:  * No post-op diagnosis entered *  PROCEDURE:  Procedure(s) with comments: Right Georganna Skeans for evacuation of subdural hematoma (Right) - Right Georganna Skeans for evacuation of subdural hematoma  SURGEON:  Surgeon(s) and Role:    * Dilyn Smiles, Joyice Faster, MD - Primary  PHYSICIAN ASSISTANT:   ASSISTANTS: none   ANESTHESIA:   general  EBL:  5cc   BLOOD ADMINISTERED:none  DRAINS: none   LOCAL MEDICATIONS USED:  LIDOCAINE   SPECIMEN:  No Specimen  DISPOSITION OF SPECIMEN:  N/A  COUNTS:  YES  TOURNIQUET:  * No tourniquets in log *  DICTATION: .Note written in EPIC  PLAN OF CARE: Admit to inpatient   PATIENT DISPOSITION:  PACU - hemodynamically stable.   Delay start of Pharmacological VTE agent (>24hrs) due to surgical blood loss or risk of bleeding: yes

## 2018-08-04 NOTE — Progress Notes (Signed)
Dr. Zada Finders aware of delay in the case. Will continue to monitor patient.

## 2018-08-04 NOTE — Transfer of Care (Signed)
Immediate Anesthesia Transfer of Care Note  Patient: Bianca Wilson  Procedure(s) Performed: Right Georganna Skeans for evacuation of subdural hematoma (Right Head)  Patient Location: PACU  Anesthesia Type:General  Level of Consciousness: awake and alert   Airway & Oxygen Therapy: Patient Spontanous Breathing and Patient connected to nasal cannula oxygen  Post-op Assessment: Report given to RN and Post -op Vital signs reviewed and stable  Post vital signs: Reviewed and stable  Last Vitals:  Vitals Value Taken Time  BP    Temp 36.5 C 08/04/2018  6:37 PM  Pulse 92 08/04/2018  6:41 PM  Resp 13 08/04/2018  6:41 PM  SpO2 93 % 08/04/2018  6:41 PM  Vitals shown include unvalidated device data.  Last Pain:  Vitals:   08/04/18 1026  TempSrc:   PainSc: 0-No pain      Patients Stated Pain Goal: 0 (22/57/50 5183)  Complications: No apparent anesthesia complications

## 2018-08-04 NOTE — Anesthesia Procedure Notes (Signed)
Arterial Line Insertion Start/End1/22/2020 2:50 PM, 08/04/2018 3:00 PM Performed by: Kyung Rudd, CRNA, CRNA  Patient location: Pre-op. Preanesthetic checklist: patient identified, IV checked, site marked, risks and benefits discussed, surgical consent, monitors and equipment checked, pre-op evaluation and timeout performed Lidocaine 1% used for infiltration Left, radial was placed Catheter size: 20 G Hand hygiene performed , maximum sterile barriers used  and Seldinger technique used Allen's test indicative of satisfactory collateral circulation Attempts: 1 Procedure performed without using ultrasound guided technique. Following insertion, Biopatch. Post procedure assessment: normal  Patient tolerated the procedure well with no immediate complications.

## 2018-08-04 NOTE — Progress Notes (Signed)
Bianca Wilson took medication this morning with thickened liquids, similar to apple sauce. Informed anesthesia, Dr. Christella Hartigan said they have to wait 6 hours after medications were given which was at 845 am.  Bianca Wilson and family aware.  Pending discussion with Dr. Zada Finders to determine if he can wait additional time to do surgery

## 2018-08-04 NOTE — Anesthesia Postprocedure Evaluation (Signed)
Anesthesia Post Note  Patient: Bianca Wilson  Procedure(s) Performed: Right Georganna Skeans for evacuation of subdural hematoma (Right Head)     Patient location during evaluation: PACU Anesthesia Type: General Level of consciousness: awake and alert Pain management: pain level controlled Vital Signs Assessment: post-procedure vital signs reviewed and stable Respiratory status: spontaneous breathing, nonlabored ventilation, respiratory function stable and patient connected to nasal cannula oxygen Cardiovascular status: blood pressure returned to baseline and stable Postop Assessment: no apparent nausea or vomiting Anesthetic complications: no    Last Vitals:  Vitals:   08/04/18 1936 08/04/18 2000  BP: (!) 146/58 (!) 145/50  Pulse: 82 87  Resp: 10 17  Temp:  36.6 C  SpO2: 100% 99%    Last Pain:  Vitals:   08/04/18 2000  TempSrc: Oral  PainSc:                  [name]

## 2018-08-04 NOTE — Op Note (Signed)
PATIENT: Bianca Wilson  DAY OF SURGERY: 08/04/18   PRE-OPERATIVE DIAGNOSIS:  Right subdural hematoma   POST-OPERATIVE DIAGNOSIS:  Right subdural hematoma   PROCEDURE:  Right burr hole evacuation of subdural hematoma   SURGEON:  Surgeon(s) and Role:    Judith Part, MD - Primary   ANESTHESIA: ETGA   BRIEF HISTORY: This is an 85 year old woman who presented with left sided weakness. The patient was found to have a large right sided subdural hematoma. A trial of non-surgical treatment did not improve her weakness and she was significantly debilitated. This was discussed with the patient as well as risks, benefits, and alternatives and wished to proceed with surgical treatment.   OPERATIVE DETAIL: The patient was taken to the operating room and placed on the OR table in the supine position. A formal time out was performed with two patient identifiers and confirmed the operative site. Anesthesia was induced by the anesthesia team. The operative site was marked, hair was clipped with surgical clippers, the area was then prepped and draped in a sterile fashion. A small linear incision was placed in the right frontal region. A burr hole was fashioned and dura incised and coagulated with immediate drainage of dark fluid consistent with chronic blood products and under high pressure. This was irrigated until the irrigation cleared without any signs of active bleeding. Hemostasis was obtained and the incision was then closed in layers. The patient was then returned to anesthesia for emergence. No apparent complications at the completion of the procedure. All counts were correct x2   EBL:  21mL   DRAINS: none   SPECIMENS: none   Judith Part, MD 08/04/18 5:34 PM

## 2018-08-04 NOTE — Anesthesia Preprocedure Evaluation (Addendum)
Anesthesia Evaluation  Patient identified by MRN, date of birth, ID band Patient awake    Reviewed: Allergy & Precautions, NPO status , [name] Chart, lab work & pertinent test results  History of Anesthesia Complications Negative for: history of anesthetic complications  Airway   TM Distance: >3 FB Neck ROM: Full   Comment: Unable to participate d/t dementia Dental  (+) Edentulous Upper, Edentulous Lower   Pulmonary sleep apnea ,    Pulmonary exam normal        Cardiovascular hypertension, Normal cardiovascular exam     Neuro/Psych PSYCHIATRIC DISORDERS Depression Dementia Chronic subdural hematoma    GI/Hepatic negative GI ROS, Neg liver ROS,   Endo/Other  diabetesHypothyroidism   Renal/GU Renal InsufficiencyRenal disease  negative genitourinary   Musculoskeletal negative musculoskeletal ROS (+)   Abdominal   Peds  Hematology negative hematology ROS (+)   Anesthesia Other Findings 85 yo F for burr hole for SDH evacuation - PMH: Alzheimer's dementia, OSA, HTN, DM2, CKD  "Echocardiogram was obtained on 10/14, and showed left ventricular ejection fraction was 65-70%. There was borderline LVH. No regional wall motion abnormalities present. Injection of contrast documented no interatrial shunt. There was mild (1+) mitral regurgitation and mild (1+) tricuspid regurgitation. Right ventricular systolic pressure was elevated between 40-50 mm Hg, consistent with moderate pulmonary hypertension. There was Grade I mild diastolic dysfunction."  Reproductive/Obstetrics                            Anesthesia Physical Anesthesia Plan  ASA: III  Anesthesia Plan: General   Post-op Pain Management:    Induction: Intravenous  PONV Risk Score and Plan: 3 and Ondansetron, Dexamethasone and Treatment may vary due to age or medical condition  Airway Management Planned: Oral ETT  Additional Equipment:  Arterial line  Intra-op Plan:   Post-operative Plan: Extubation in OR  Informed Consent: I have reviewed the patients History and Physical, chart, labs and discussed the procedure including the risks, benefits and alternatives for the proposed anesthesia with the patient or authorized representative who has indicated his/her understanding and acceptance.     Dental advisory given  Plan Discussed with:   Anesthesia Plan Comments:       Anesthesia Quick Evaluation

## 2018-08-04 NOTE — Progress Notes (Signed)
Neurosurgery Service Post-operative progress note  Assessment & Plan: 85 y.o. woman s/p burr hole drainage of SDH, seen in PACU, FC on R, eyes open to voice and answering simple questions, gripping with left hand to command, recovering well. -admit to 4N -CTH tonight to evaluate extent of evacuation -PT/OT in AM  Bianca Wilson  08/04/18 6:38 PM

## 2018-08-04 NOTE — H&P (Signed)
Surgical H&P Update  HPI: 85 y.o. woman with left sided arm and leg weakness with right sided subdural hematoma, here for burr hole evacuation. She has dementia, but prior to her fall a few weeks ago, she was ambulating and participating in group trips and activities. Since her fall and SDH, she has been wheelchair bound and had a significant decrement in her quality of life. We tried a period of non-surgical treatment as well as a trial of AEDs without any improvement. I therefore recommended surgery to decompress the hematoma. She is still having profound left sided weakness and would like to proceed with surgery.   PMHx:  Past Medical History:  Diagnosis Date  . Alzheimer disease ([place])   . Chronic kidney disease    stage 3  . Diabetes mellitus without complication ([place])    type 2  . Dysphagia   . Hypertension   . Osteoporosis    FamHx: No family history on file. SocHx:  reports that she has never smoked. She has never used smokeless tobacco. She reports that she does not drink alcohol or use drugs.  Physical Exam: Somnolent, PERRL, gaze neutral FC w/ Strength 5/5 on R, 3/5 on L  Assesment/Plan: 85 y.o. woman with right subdural hematoma and left sided weakness, here for burr hole evacuation. Risks, benefits, and alternatives discussed and the patient would like to continue with surgery. -OR today -4N post-op  Judith Part, MD 08/04/18 5:32 PM

## 2018-08-05 ENCOUNTER — Inpatient Hospital Stay (HOSPITAL_COMMUNITY): Payer: MEDICARE

## 2018-08-05 ENCOUNTER — Encounter (HOSPITAL_COMMUNITY): Payer: Self-pay | Admitting: Neurological Surgery

## 2018-08-05 LAB — GLUCOSE, CAPILLARY
Glucose-Capillary: 181 mg/dL — ABNORMAL HIGH (ref 70–99)
Glucose-Capillary: 259 mg/dL — ABNORMAL HIGH (ref 70–99)
Glucose-Capillary: 196 mg/dL — ABNORMAL HIGH (ref 70–99)
Glucose-Capillary: 172 mg/dL — ABNORMAL HIGH (ref 70–99)

## 2018-08-05 MED ORDER — LEVETIRACETAM 500 MG PO TABS: 1000.0000 mg | ORAL_TABLET | Freq: Once | ORAL | Status: DC

## 2018-08-05 MED ORDER — LEVETIRACETAM 500 MG PO TABS
500.0000 mg | ORAL_TABLET | Freq: Once | ORAL | Status: AC
  Administered 2018-08-05: 500 mg via ORAL
  Filled 2018-08-05: qty 1

## 2018-08-05 MED ORDER — LEVETIRACETAM 500 MG PO TABS: 500.0000 mg | ORAL_TABLET | Freq: Two times a day (BID) | ORAL | 2 refills | Status: DC

## 2018-08-05 NOTE — Evaluation (Signed)
Occupational Therapy Evaluation Patient Details Name: Bianca Wilson MRN: [id number] DOB: 05/31/1931 Today's Date: 08/05/2018    History of Present Illness Pt is an 85 y/o female admitted from SNF with continued left sided weakness and decreased mobility due to SDH after fall now s/p Rt burr hole 1/22. 2 recent falls with bil SDH with expansion of right. PMhx: Alzheimers, HTN, CKD, DM   Clinical Impression   [name] presents with the above. Prior to this admission pt was residing in nursing facility, mostly wheelchair bound for functional mobility, receiving assist from staff for transfers and ADL completion. Pt presenting with L side weakness, decreased sitting and standing balance impacting her functional performance. Pt requiring maxA+2 for stand pivot transfers using RW this session. Requiring minguard assist for simple grooming ADL, mod-maxA for UB ADL, and overall totalA for LB and toileting ADL. Pt with very supportive daughter present and encouraging throughout session. Pt will benefit from continued acute OT services and recommend follow up therapy services in SNF setting after discharge to maximize her safety and independence with ADL and mobility. Will follow.     Follow Up Recommendations  SNF;Supervision/Assistance - 24 hour    Equipment Recommendations  Other (comment)(defer to next venue)           Precautions / Restrictions Precautions Precautions: Fall Precaution Comments: left weakness Restrictions Weight Bearing Restrictions: No      Mobility Bed Mobility Overal bed mobility: Needs Assistance Bed Mobility: Rolling;Sidelying to Sit Rolling: Mod assist Sidelying to sit: Mod assist       General bed mobility comments: cues for sequence with assist to rotate pelvis and elevate trunk from surface  Transfers Overall transfer level: Needs assistance Equipment used: Rolling walker (2 wheeled) Transfers: Sit to/from [facility] Sit to  Stand: Min assist;+2 safety/equipment Stand pivot transfers: Max assist;+2 physical assistance       General transfer comment: min assist to rise from bed, bSC and chair with flexed posture and cues for hand placement, pt unable to significantly move feet in standing to step or turn and requires max +2 for pivot to [facility] toward left and chair toward right    Balance Overall balance assessment: Needs assistance   Sitting balance-Leahy Scale: Fair     Standing balance support: Bilateral upper extremity supported Standing balance-Leahy Scale: Poor Standing balance comment: trunk flexed with bil UE support in standing with and without RW                           ADL either performed or assessed with clinical judgement   ADL Overall ADL's : Needs assistance/impaired Eating/Feeding: Minimal assistance;Set up;Sitting   Grooming: Set up;Min guard;Sitting;Wash/dry face Grooming Details (indicate cue type and reason): supported sitting; cues to initiate task  Upper Body Bathing: Sitting;Moderate assistance   Lower Body Bathing: Maximal assistance;+2 for physical assistance;+2 for safety/equipment;Sit to/from stand   Upper Body Dressing : Maximal assistance;Sitting   Lower Body Dressing: Total assistance;+2 for physical assistance;+2 for safety/equipment;Sit to/from stand Lower Body Dressing Details (indicate cue type and reason): totalA to don sock at bed level Toilet Transfer: Maximal assistance;+2 for physical assistance;+2 for safety/equipment;Stand-pivot;BSC;RW   Toileting- Clothing Manipulation and Hygiene: Total assistance;+2 for physical assistance;+2 for safety/equipment;Sit to/from stand Toileting - Clothing Manipulation Details (indicate cue type and reason): +2 assist for standing balance and for completing peri-care after BM     Functional mobility during ADLs: Maximal assistance;+2 for physical  assistance;+2 for safety/equipment;Rolling walker(stand pivot  transfers) General ADL Comments: pt with L side weakness, decreased activity tolerance, baseline cognitive impairments     Vision Baseline Vision/History: Wears glasses Wears Glasses: Reading only Patient Visual Report: No change from baseline Additional Comments: pt's daughter reports pt is "supposed" to wear readers but typically does not, pt tracking in L/R visual fields during session     Perception     Praxis      Pertinent Vitals/Pain Pain Assessment: No/denies pain     Hand Dominance Right   Extremity/Trunk Assessment Upper Extremity Assessment Upper Extremity Assessment: LUE deficits/detail LUE Deficits / Details: grossly 1-2/5 throughout shoulder and elbow, weak grip strength but grip is present; tremors noted with PROM attempts; edemous L hand LUE Coordination: decreased fine motor;decreased gross motor   Lower Extremity Assessment Lower Extremity Assessment: Defer to PT evaluation LLE Deficits / Details: 3/5 hip flexion and quad    Cervical / Trunk Assessment Cervical / Trunk Assessment: Kyphotic;Other exceptions Cervical / Trunk Exceptions: forward head   Communication Communication Communication: HOH   Cognition Arousal/Alertness: Awake/alert Behavior During Therapy: Flat affect Overall Cognitive Status: History of cognitive impairments - at baseline                                 General Comments: pt overall following one step commands, does not always answer questions correctly, able to state her DOB and age    General Comments  pt's daughter present and supportive throughout session    Exercises General Exercises - Lower Extremity Long Arc Quad: AAROM;5 reps;Seated;Left   Shoulder Instructions      Home Living Family/patient expects to be discharged to:: Skilled nursing facility                                        Prior Functioning/Environment Level of Independence: Needs assistance  Gait / Transfers  Assistance Needed: pt was ambulating the first of DEc and has been max assist for transfers since Dec 19 ADL's / Homemaking Assistance Needed: max assist for bathing/dressing/feeding            OT Problem List: Decreased strength;Decreased range of motion;Decreased activity tolerance;Impaired balance (sitting and/or standing);Decreased coordination;Decreased cognition;Decreased knowledge of use of DME or AE;Impaired UE functional use      OT Treatment/Interventions: Self-care/ADL training;Therapeutic exercise;Neuromuscular education;DME and/or AE instruction;Patient/family education;Balance training    OT Goals(Current goals can be found in the care plan section) Acute Rehab OT Goals Patient Stated Goal: return to moving  OT Goal Formulation: With patient Time For Goal Achievement: 08/19/18 Potential to Achieve Goals: Good  OT Frequency: Min 2X/week   Barriers to D/C:            Co-evaluation PT/OT/SLP Co-Evaluation/Treatment: Yes Reason for Co-Treatment: Complexity of the patient's impairments (multi-system involvement);Necessary to address cognition/behavior during functional activity;For patient/therapist safety;To address functional/ADL transfers PT goals addressed during session: Mobility/safety with mobility;Balance OT goals addressed during session: ADL's and self-care;Proper use of Adaptive equipment and DME      AM-PAC OT "6 Clicks" Daily Activity     Outcome Measure Help from another person eating meals?: A Little Help from another person taking care of personal grooming?: A Little Help from another person toileting, which includes using toliet, bedpan, or urinal?: Total Help from another person bathing (including washing, rinsing, drying)?:  A Lot Help from another person to put on and taking off regular upper body clothing?: A Lot Help from another person to put on and taking off regular lower body clothing?: Total 6 Click Score: 12   End of Session Equipment  Utilized During Treatment: Gait belt;Rolling walker Nurse Communication: Mobility status  Activity Tolerance: Patient tolerated treatment well Patient left: in chair;with call bell/phone within reach;with chair alarm set;with family/visitor present  OT Visit Diagnosis: Unsteadiness on feet (R26.81);Other abnormalities of gait and mobility (R26.89);Muscle weakness (generalized) (M62.81);Hemiplegia and hemiparesis Hemiplegia - Right/Left: Left Hemiplegia - dominant/non-dominant: Non-Dominant                Time: 5697-9480 OT Time Calculation (min): 30 min Charges:  OT General Charges $OT Visit: 1 Visit OT Evaluation $OT Eval Moderate Complexity: 1 Mod  Lou Cal, OT [employer] Pager [phone] Office [phone]   Raymondo Band 08/05/2018, 10:49 AM

## 2018-08-05 NOTE — Progress Notes (Signed)
Report called to Child psychotherapist at [facility]. Family updated on plan to discharge. Will continue to monitor. Lianne Bushy RN BSN

## 2018-08-05 NOTE — Clinical Social Work Note (Signed)
Clinical Social Worker received notification that patient is from [facility] in [place].  Patient has been admitted for less than 24 hours, therefore facility is in agreement with patient return with discharge summary only.  Clinical Social Worker facilitated patient discharge including contacting patient family and facility to confirm patient discharge plans.  Clinical information faxed to facility and family agreeable with plan.  CSW arranged ambulance transport via [facility] to [facility].  RN to call report prior to discharge [phone] station [phone]  Clinical Social Worker will sign off for now as social work intervention is no longer needed. Please consult [place] again if new need arises.  Barbette Or, [place]

## 2018-08-05 NOTE — Discharge Summary (Addendum)
Discharge Summary  Date of Admission: 08/04/2018  Date of Discharge: 08/05/18  Attending Physician: Emelda Brothers, MD  Patient was admitted following an uncomplicated burr hole drainage of a right sided subdural hematoma. She was recovered in PACU and transferred to 4N. Her hospital course was uncomplicated. On POD1, pt was much more alert and strength was already improving in the left upper extremity. She was therefore discharged back to her facility on 08/05/2018. She will follow up in clinic with me in 2 weeks.  Neurologic exam at discharge:  Awake/alert, Ox1, PERRL, EOMI, FS, Strength 5/5 on R, 4/5 on L  Discharge Diagnosis: Subdural hematoma  Diet: Resume regular diet  Discharge Medications: Allergies as of 08/05/2018      Reactions   Benadryl [diphenhydramine]    Per MAR   Hydrocodone    Per [facility] 08/04/18 Family reports sensitive to narcotics (confusion/delirium)   Other Other (See Comments)   Muscles Relaxers ,Narcotics  08/04/18 Family reports sensitive to narcotics (confusion/delirium)      Medication List    TAKE these medications   acetaminophen 325 MG tablet Commonly known as:  TYLENOL Take 2 tablets (650 mg total) by mouth every 4 (four) hours as needed for fever, headache or mild pain.   alendronate 70 MG tablet Commonly known as:  FOSAMAX Take 70 mg by mouth every Sunday. Take with a full glass of water on an empty stomach.   amLODipine 10 MG tablet Commonly known as:  NORVASC Take 10 mg by mouth daily.   atorvastatin 20 MG tablet Commonly known as:  LIPITOR Take 20 mg by mouth at bedtime.   diphenhydrAMINE 25 mg capsule Commonly known as:  BENADRYL Take 1 capsule (25 mg total) by mouth every 6 (six) hours as needed for itching.   donepezil 10 MG tablet Commonly known as:  ARICEPT Take 10 mg by mouth at bedtime.   escitalopram 20 MG tablet Commonly known as:  LEXAPRO Take 20 mg by mouth daily.   insulin aspart 100 UNIT/ML injection Commonly  known as:  novoLOG Inject 0-9 Units into the skin 3 (three) times daily with meals. Sliding scale CBG 70 - 120: 0 units CBG 121 - 150: 1 unit,  CBG 151 - 200: 2 units,  CBG 201 - 250: 3 units,  CBG 251 - 300: 5 units,  CBG 301 - 350: 7 units,  CBG 351 - 400: 9 units   CBG > 400: 9 units and notify your MD What changed:  additional instructions   levETIRAcetam 500 MG tablet Commonly known as:  KEPPRA Take 1 tablet (500 mg total) by mouth 2 (two) times daily. What changed:    medication strength  how much to take   levothyroxine 25 MCG tablet Commonly known as:  SYNTHROID, LEVOTHROID Take 25 mcg by mouth daily before breakfast.   loperamide 2 MG tablet Commonly known as:  IMODIUM A-D Take 2 mg by mouth as needed for diarrhea or loose stools.   memantine 10 MG tablet Commonly known as:  NAMENDA Take 10 mg by mouth 2 (two) times daily.   metFORMIN 500 MG 24 hr tablet Commonly known as:  GLUCOPHAGE-XR Take 500 mg by mouth daily with breakfast.   ondansetron 4 MG tablet Commonly known as:  ZOFRAN Take 4 mg by mouth every 8 (eight) hours as needed for nausea or vomiting.       Judith Part, MD 08/05/18 12:12 PM

## 2018-08-05 NOTE — Discharge Instructions (Signed)
Discharge Instructions  No restriction in activities, slowly increase your activity back to normal.   Your incision is closed with absorbable sutures. These will naturally fall off over the next 4-6 weeks. If they become bothersome or cause discomfort, apply some antibiotic ointment like bacitracin or neosporin on the sutures. This will soften them up and usually makes them more comfortable while they dissolve.  Okay to shower on the day of discharge. Be gentle when cleaning your incision. Use regular soap and water. If that is uncomfortable, try using baby shampoo. Do not submerge the wound under water for 2 weeks after surgery.  Follow up with Dr. Gina Leblond in 2 weeks after discharge. If you do not already have a discharge appointment, please call his office at [phone] to schedule a follow up appointment. If you have any concerns or questions, please call the office and let us know. 

## 2018-08-05 NOTE — Progress Notes (Addendum)
Neurosurgery Service Progress Note  Subjective: No acute events overnight, more awake this morning than preop.   Objective: Vitals:   08/05/18 0500 08/05/18 0600 08/05/18 0700 08/05/18 0800  BP: (!) 138/54 130/73 (!) 136/58 (!) 143/62  Pulse: 62 80 76 68  Resp: 12 20    Temp:    98.9 F (37.2 C)  TempSrc:    Oral  SpO2: 96% 99% 93% 95%  Weight:      Height:       Temp (24hrs), Avg:98.1 F (36.7 C), Min:97.5 F (36.4 C), Max:98.9 F (37.2 C)  CBC Latest Ref Rng & Units 08/04/2018 07/08/2018 07/05/2018  WBC 4.0 - 10.5 K/uL 6.6 5.2 6.4  Hemoglobin 12.0 - 15.0 g/dL 13.6 12.3 11.5(L)  Hematocrit 36.0 - 46.0 % 43.4 36.9 34.9(L)  Platelets 150 - 400 K/uL 237 295 280   BMP Latest Ref Rng & Units 08/04/2018 07/09/2018 07/08/2018  Glucose 70 - 99 mg/dL 197(H) 114(H) 121(H)  BUN 8 - 23 mg/dL 19 8 7(L)  Creatinine 0.44 - 1.00 mg/dL 0.96 0.86 0.83  Sodium 135 - 145 mmol/L 145 142 142  Potassium 3.5 - 5.1 mmol/L 4.2 3.5 3.5  Chloride 98 - 111 mmol/L 108 105 107  CO2 22 - 32 mmol/L 27 27 26   Calcium 8.9 - 10.3 mg/dL 9.6 9.2 8.8(L)    Intake/Output Summary (Last 24 hours) at 08/05/2018 5170 Last data filed at 08/05/2018 0800 Gross per 24 hour  Intake 1000 ml  Output 435 ml  Net 565 ml    Current Facility-Administered Medications:  .  amLODipine (NORVASC) tablet 10 mg, 10 mg, Oral, Daily, Christopher Hink A, MD .  atorvastatin (LIPITOR) tablet 20 mg, 20 mg, Oral, QHS, Pier Bosher, Joyice Faster, MD, 20 mg at 08/04/18 2136 .  docusate (COLACE) 50 MG/5ML liquid 100 mg, 100 mg, Oral, BID, Skeet Simmer, [facility] .  donepezil (ARICEPT) tablet 10 mg, 10 mg, Oral, QHS, Shauntea Lok, Joyice Faster, MD, 10 mg at 08/04/18 2137 .  escitalopram (LEXAPRO) tablet 20 mg, 20 mg, Oral, Daily, Judith Part, MD, 20 mg at 08/04/18 2135 .  feeding supplement (ENSURE ENLIVE) (ENSURE ENLIVE) liquid 237 mL, 237 mL, Oral, BID BM, Darletta Noblett A, MD .  HYDROcodone-acetaminophen (NORCO/VICODIN) 5-325 MG per  tablet 1 tablet, 1 tablet, Oral, Q4H PRN, Dick Hark A, MD .  insulin aspart (novoLOG) injection 0-9 Units, 0-9 Units, Subcutaneous, TID WC, Judith Part, MD, 5 Units at 08/05/18 0753 .  labetalol (NORMODYNE,TRANDATE) injection 10-40 mg, 10-40 mg, Intravenous, Q10 min PRN, Judith Part, MD .  levETIRAcetam (KEPPRA) 100 MG/ML solution 1,000 mg, 1,000 mg, Oral, BID, Skeet Simmer, RPH, 1,000 mg at 08/04/18 2137 .  levothyroxine (SYNTHROID, LEVOTHROID) tablet 25 mcg, 25 mcg, Oral, QAC breakfast, Judith Part, MD, 25 mcg at 08/05/18 [phone] .  memantine (NAMENDA) tablet 10 mg, 10 mg, Oral, BID, Judith Part, MD, 10 mg at 08/04/18 2137 .  metFORMIN (GLUCOPHAGE-XR) 24 hr tablet 500 mg, 500 mg, Oral, Q breakfast, Tejay Hubert, Joyice Faster, MD, 500 mg at 08/05/18 0753 .  [DISCONTINUED] ondansetron (ZOFRAN) tablet 4 mg, 4 mg, Oral, Q4H PRN **OR** ondansetron (ZOFRAN) injection 4 mg, 4 mg, Intravenous, Q4H PRN, Korra Christine A, MD .  ondansetron (ZOFRAN) tablet 4 mg, 4 mg, Oral, Q8H PRN, Shanel Prazak A, MD .  oxyCODONE-acetaminophen (PERCOCET/ROXICET) 5-325 MG per tablet 1 tablet, 1 tablet, Oral, Q4H PRN, Judith Part, MD .  promethazine (PHENERGAN) tablet 12.5-25 mg, 12.5-25 mg, Oral,  Q4H PRN, Judith Part, MD   Physical Exam: Awake/alert, Ox1, PERRL, EOMI, FS, Strength 5/5 on R, 4/5 on L  Assessment & Plan: 85 y.o. woman s/p burr hole evacuation of subdural hematoma, recovering well. Post-op CTH with 50% evacuation, posterior component remains due to membrane formation. -Pt clinically better, no need for repeat evacuation at this time -discharge back to facility today -decrease keppra to 500bid, will likely d/c at 2wk follow up appointment  [place]  08/05/18 8:22 AM

## 2018-08-05 NOTE — Evaluation (Signed)
Physical Therapy Evaluation Patient Details Name: Bianca Wilson MRN: [id number] DOB: September 29, 1930 Today's Date: 08/05/2018   History of Present Illness  Pt is an 85 y/o female admitted from SNF with continued left sided weakness and decreased mobility due to SDH after fall now s/p Rt burr hole 1/22. 2 recent falls with bil SDH with expansion of right. PMhx: Alzheimers, HTN, CKD, DM  Clinical Impression  Pt pleasant with flat affect and decreased cognition throughout session. Pt with improved strength left side per family with grossly 3/5 LLE strength, gripping and moving LUE throughout session. Pt able to initiate and perform standing with decreased assist from last admission but unable to step and weight shift for pivot without 2 person assist. Pt with decreased strength, balance, cognition and functional mobility who will benefit from acute therapy to increase function for decreased burden of care.     Follow Up Recommendations SNF;Supervision/Assistance - 24 hour    Equipment Recommendations  None recommended by PT    Recommendations for Other Services       Precautions / Restrictions Precautions Precautions: Fall Precaution Comments: left weakness Restrictions Weight Bearing Restrictions: No      Mobility  Bed Mobility Overal bed mobility: Needs Assistance Bed Mobility: Rolling;Sidelying to Sit Rolling: Mod assist Sidelying to sit: Mod assist       General bed mobility comments: cues for sequence with assist to rotate pelvis and elevate trunk from surface  Transfers Overall transfer level: Needs assistance   Transfers: Sit to/from Stand;Stand Pivot Transfers Sit to Stand: Min assist;+2 safety/equipment Stand pivot transfers: Max assist;+2 physical assistance       General transfer comment: min assist to rise from bed, bSC and chair with flexed posture and cues for hand placement, pt unable to significantly move feet in standing to step or turn and requires max +2  for pivot to [facility] toward left and chair toward right  Ambulation/Gait             General Gait Details: not yet able  Stairs            Wheelchair Mobility    Modified Rankin (Stroke Patients Only)       Balance Overall balance assessment: Needs assistance   Sitting balance-Leahy Scale: Fair       Standing balance-Leahy Scale: Poor Standing balance comment: trunk flexed with bil UE support in standing with and without RW                             Pertinent Vitals/Pain Pain Assessment: No/denies pain    Home Living Family/patient expects to be discharged to:: Skilled nursing facility                      Prior Function Level of Independence: Needs assistance   Gait / Transfers Assistance Needed: pt was ambulating the first of DEc and has been max assist for transfers since Dec 19  ADL's / Homemaking Assistance Needed: max assist for bathing/dressing/feeding        Hand Dominance        Extremity/Trunk Assessment   Upper Extremity Assessment Upper Extremity Assessment: Defer to OT evaluation    Lower Extremity Assessment Lower Extremity Assessment: LLE deficits/detail LLE Deficits / Details: 3/5 hip flexion and quad     Cervical / Trunk Assessment Cervical / Trunk Assessment: Kyphotic;Other exceptions Cervical / Trunk Exceptions: forward head  Communication   Communication: [place]  Cognition Arousal/Alertness: Awake/alert Behavior During Therapy: Flat affect Overall Cognitive Status: History of cognitive impairments - at baseline                                        General Comments      Exercises General Exercises - Lower Extremity Long Arc Quad: AAROM;5 reps;Seated;Left   Assessment/Plan    PT Assessment Patient needs continued PT services  PT Problem List Decreased strength;Decreased balance;Decreased cognition;Decreased mobility;Decreased activity tolerance;Decreased safety  awareness;Decreased knowledge of use of DME       PT Treatment Interventions DME instruction;Functional mobility training;Balance training;Patient/family education;Therapeutic activities;Gait training;Neuromuscular re-education;Therapeutic exercise    PT Goals (Current goals can be found in the Care Plan section)  Acute Rehab PT Goals Patient Stated Goal: return to moving  PT Goal Formulation: With family Time For Goal Achievement: 08/19/18 Potential to Achieve Goals: Fair    Frequency Min 2X/week   Barriers to discharge        Co-evaluation PT/OT/SLP Co-Evaluation/Treatment: Yes Reason for Co-Treatment: Complexity of the patient's impairments (multi-system involvement);Necessary to address cognition/behavior during functional activity PT goals addressed during session: Mobility/safety with mobility;Balance         AM-PAC PT "6 Clicks" Mobility  Outcome Measure Help needed turning from your back to your side while in a flat bed without using bedrails?: A Lot Help needed moving from lying on your back to sitting on the side of a flat bed without using bedrails?: A Lot Help needed moving to and from a bed to a chair (including a wheelchair)?: A Lot Help needed standing up from a chair using your arms (e.g., wheelchair or bedside chair)?: A Lot Help needed to walk in hospital room?: Total Help needed climbing 3-5 steps with a railing? : Total 6 Click Score: 10    End of Session Equipment Utilized During Treatment: Gait belt Activity Tolerance: Patient tolerated treatment well Patient left: in chair;with call bell/phone within reach;with chair alarm set;with family/visitor present;with nursing/sitter in room Nurse Communication: Mobility status;Other (comment)(+2 pivot or use of stedy) PT Visit Diagnosis: Other abnormalities of gait and mobility (R26.89);Muscle weakness (generalized) (M62.81);Unsteadiness on feet (R26.81);History of falling (Z91.81)    Time: 8413-2440 PT  Time Calculation (min) (ACUTE ONLY): 30 min   Charges:   PT Evaluation $PT Eval Moderate Complexity: 1 Mod          [place], PT Acute Rehabilitation Services Pager: [phone] Office: [phone]   Myka Lukins B Jenita [place]field 08/05/2018, 10:22 AM

## 2018-08-06 DIAGNOSIS — E119 Type 2 diabetes mellitus without complications: Secondary | ICD-10-CM | POA: Diagnosis not present

## 2018-08-06 DIAGNOSIS — N183 Chronic kidney disease, stage 3 (moderate): Secondary | ICD-10-CM | POA: Diagnosis not present

## 2018-08-06 DIAGNOSIS — E785 Hyperlipidemia, unspecified: Secondary | ICD-10-CM | POA: Diagnosis not present

## 2018-08-06 DIAGNOSIS — I10 Essential (primary) hypertension: Secondary | ICD-10-CM | POA: Diagnosis not present

## 2018-08-12 ENCOUNTER — Other Ambulatory Visit (HOSPITAL_COMMUNITY): Payer: Self-pay | Admitting: Neurological Surgery

## 2018-08-12 DIAGNOSIS — S065XAA Traumatic subdural hemorrhage with loss of consciousness status unknown, initial encounter: Secondary | ICD-10-CM

## 2018-08-12 DIAGNOSIS — N3090 Cystitis, unspecified without hematuria: Secondary | ICD-10-CM | POA: Diagnosis not present

## 2018-08-12 DIAGNOSIS — R4182 Altered mental status, unspecified: Secondary | ICD-10-CM | POA: Diagnosis not present

## 2018-08-12 DIAGNOSIS — R410 Disorientation, unspecified: Secondary | ICD-10-CM | POA: Diagnosis not present

## 2018-08-12 DIAGNOSIS — I6200 Nontraumatic subdural hemorrhage, unspecified: Secondary | ICD-10-CM | POA: Diagnosis not present

## 2018-08-12 DIAGNOSIS — S065X9A Traumatic subdural hemorrhage with loss of consciousness of unspecified duration, initial encounter: Secondary | ICD-10-CM

## 2018-08-12 DIAGNOSIS — R42 Dizziness and giddiness: Secondary | ICD-10-CM

## 2018-08-13 ENCOUNTER — Ambulatory Visit (HOSPITAL_COMMUNITY): Admission: RE | Admit: 2018-08-13 | Payer: MEDICARE | Source: Ambulatory Visit

## 2018-08-13 ENCOUNTER — Encounter (HOSPITAL_COMMUNITY): Payer: Self-pay

## 2018-08-18 DIAGNOSIS — D559 Anemia due to enzyme disorder, unspecified: Secondary | ICD-10-CM | POA: Diagnosis not present

## 2018-08-18 DIAGNOSIS — E119 Type 2 diabetes mellitus without complications: Secondary | ICD-10-CM | POA: Diagnosis not present

## 2018-08-18 DIAGNOSIS — D518 Other vitamin B12 deficiency anemias: Secondary | ICD-10-CM | POA: Diagnosis not present

## 2018-08-18 DIAGNOSIS — E038 Other specified hypothyroidism: Secondary | ICD-10-CM | POA: Diagnosis not present

## 2018-08-19 DIAGNOSIS — B3741 Candidal cystitis and urethritis: Secondary | ICD-10-CM | POA: Diagnosis not present

## 2018-08-19 DIAGNOSIS — E119 Type 2 diabetes mellitus without complications: Secondary | ICD-10-CM | POA: Diagnosis not present

## 2018-08-19 DIAGNOSIS — M810 Age-related osteoporosis without current pathological fracture: Secondary | ICD-10-CM | POA: Diagnosis not present

## 2018-08-19 DIAGNOSIS — I10 Essential (primary) hypertension: Secondary | ICD-10-CM | POA: Diagnosis not present

## 2018-08-29 DIAGNOSIS — E119 Type 2 diabetes mellitus without complications: Secondary | ICD-10-CM | POA: Diagnosis not present

## 2018-08-29 DIAGNOSIS — G309 Alzheimer's disease, unspecified: Secondary | ICD-10-CM | POA: Diagnosis not present

## 2018-08-29 DIAGNOSIS — I10 Essential (primary) hypertension: Secondary | ICD-10-CM | POA: Diagnosis not present

## 2018-08-29 DIAGNOSIS — E039 Hypothyroidism, unspecified: Secondary | ICD-10-CM | POA: Diagnosis not present

## 2018-09-10 DIAGNOSIS — R296 Repeated falls: Secondary | ICD-10-CM | POA: Diagnosis not present

## 2018-09-10 DIAGNOSIS — S065X9A Traumatic subdural hemorrhage with loss of consciousness of unspecified duration, initial encounter: Secondary | ICD-10-CM | POA: Diagnosis not present

## 2018-09-10 DIAGNOSIS — F0280 Dementia in other diseases classified elsewhere without behavioral disturbance: Secondary | ICD-10-CM | POA: Diagnosis not present

## 2018-09-10 DIAGNOSIS — G309 Alzheimer's disease, unspecified: Secondary | ICD-10-CM | POA: Diagnosis not present

## 2018-09-14 DIAGNOSIS — S0990XA Unspecified injury of head, initial encounter: Secondary | ICD-10-CM | POA: Diagnosis not present

## 2018-09-14 DIAGNOSIS — S065X9A Traumatic subdural hemorrhage with loss of consciousness of unspecified duration, initial encounter: Secondary | ICD-10-CM | POA: Diagnosis not present

## 2018-09-14 DIAGNOSIS — R296 Repeated falls: Secondary | ICD-10-CM | POA: Diagnosis not present

## 2018-09-23 DIAGNOSIS — E119 Type 2 diabetes mellitus without complications: Secondary | ICD-10-CM | POA: Diagnosis not present

## 2018-09-23 DIAGNOSIS — N183 Chronic kidney disease, stage 3 (moderate): Secondary | ICD-10-CM | POA: Diagnosis not present

## 2018-09-23 DIAGNOSIS — I10 Essential (primary) hypertension: Secondary | ICD-10-CM | POA: Diagnosis not present

## 2018-09-23 DIAGNOSIS — M810 Age-related osteoporosis without current pathological fracture: Secondary | ICD-10-CM | POA: Diagnosis not present

## 2018-10-04 DIAGNOSIS — E119 Type 2 diabetes mellitus without complications: Secondary | ICD-10-CM | POA: Diagnosis not present

## 2018-10-04 DIAGNOSIS — D559 Anemia due to enzyme disorder, unspecified: Secondary | ICD-10-CM | POA: Diagnosis not present

## 2018-10-04 DIAGNOSIS — D518 Other vitamin B12 deficiency anemias: Secondary | ICD-10-CM | POA: Diagnosis not present

## 2018-10-04 DIAGNOSIS — E038 Other specified hypothyroidism: Secondary | ICD-10-CM | POA: Diagnosis not present

## 2018-10-07 DIAGNOSIS — E039 Hypothyroidism, unspecified: Secondary | ICD-10-CM | POA: Diagnosis not present

## 2018-10-07 DIAGNOSIS — E785 Hyperlipidemia, unspecified: Secondary | ICD-10-CM | POA: Diagnosis not present

## 2018-10-07 DIAGNOSIS — I10 Essential (primary) hypertension: Secondary | ICD-10-CM | POA: Diagnosis not present

## 2018-10-07 DIAGNOSIS — E1149 Type 2 diabetes mellitus with other diabetic neurological complication: Secondary | ICD-10-CM | POA: Diagnosis not present

## 2018-10-15 DIAGNOSIS — I10 Essential (primary) hypertension: Secondary | ICD-10-CM | POA: Diagnosis not present

## 2018-10-15 DIAGNOSIS — N183 Chronic kidney disease, stage 3 (moderate): Secondary | ICD-10-CM | POA: Diagnosis not present

## 2018-10-15 DIAGNOSIS — R296 Repeated falls: Secondary | ICD-10-CM | POA: Diagnosis not present

## 2018-10-15 DIAGNOSIS — E039 Hypothyroidism, unspecified: Secondary | ICD-10-CM | POA: Diagnosis not present

## 2018-10-19 DIAGNOSIS — R262 Difficulty in walking, not elsewhere classified: Secondary | ICD-10-CM | POA: Diagnosis not present

## 2018-10-19 DIAGNOSIS — R296 Repeated falls: Secondary | ICD-10-CM | POA: Diagnosis not present

## 2018-10-19 DIAGNOSIS — M6281 Muscle weakness (generalized): Secondary | ICD-10-CM | POA: Diagnosis not present

## 2018-10-22 DIAGNOSIS — E039 Hypothyroidism, unspecified: Secondary | ICD-10-CM | POA: Diagnosis not present

## 2018-10-24 DIAGNOSIS — E1149 Type 2 diabetes mellitus with other diabetic neurological complication: Secondary | ICD-10-CM | POA: Diagnosis not present

## 2018-10-24 DIAGNOSIS — I10 Essential (primary) hypertension: Secondary | ICD-10-CM | POA: Diagnosis not present

## 2018-10-29 DIAGNOSIS — R296 Repeated falls: Secondary | ICD-10-CM | POA: Diagnosis not present

## 2018-10-29 DIAGNOSIS — M6281 Muscle weakness (generalized): Secondary | ICD-10-CM | POA: Diagnosis not present

## 2018-10-29 DIAGNOSIS — R262 Difficulty in walking, not elsewhere classified: Secondary | ICD-10-CM | POA: Diagnosis not present

## 2018-11-01 DIAGNOSIS — R262 Difficulty in walking, not elsewhere classified: Secondary | ICD-10-CM | POA: Diagnosis not present

## 2018-11-01 DIAGNOSIS — M6281 Muscle weakness (generalized): Secondary | ICD-10-CM | POA: Diagnosis not present

## 2018-11-01 DIAGNOSIS — R296 Repeated falls: Secondary | ICD-10-CM | POA: Diagnosis not present

## 2018-11-02 DIAGNOSIS — M6281 Muscle weakness (generalized): Secondary | ICD-10-CM | POA: Diagnosis not present

## 2018-11-02 DIAGNOSIS — R296 Repeated falls: Secondary | ICD-10-CM | POA: Diagnosis not present

## 2018-11-02 DIAGNOSIS — R262 Difficulty in walking, not elsewhere classified: Secondary | ICD-10-CM | POA: Diagnosis not present

## 2018-11-03 DIAGNOSIS — R296 Repeated falls: Secondary | ICD-10-CM | POA: Diagnosis not present

## 2018-11-03 DIAGNOSIS — M6281 Muscle weakness (generalized): Secondary | ICD-10-CM | POA: Diagnosis not present

## 2018-11-03 DIAGNOSIS — R262 Difficulty in walking, not elsewhere classified: Secondary | ICD-10-CM | POA: Diagnosis not present

## 2018-11-04 DIAGNOSIS — R296 Repeated falls: Secondary | ICD-10-CM | POA: Diagnosis not present

## 2018-11-04 DIAGNOSIS — E038 Other specified hypothyroidism: Secondary | ICD-10-CM | POA: Diagnosis not present

## 2018-11-04 DIAGNOSIS — E119 Type 2 diabetes mellitus without complications: Secondary | ICD-10-CM | POA: Diagnosis not present

## 2018-11-04 DIAGNOSIS — R262 Difficulty in walking, not elsewhere classified: Secondary | ICD-10-CM | POA: Diagnosis not present

## 2018-11-04 DIAGNOSIS — D518 Other vitamin B12 deficiency anemias: Secondary | ICD-10-CM | POA: Diagnosis not present

## 2018-11-04 DIAGNOSIS — M6281 Muscle weakness (generalized): Secondary | ICD-10-CM | POA: Diagnosis not present

## 2018-11-04 DIAGNOSIS — D559 Anemia due to enzyme disorder, unspecified: Secondary | ICD-10-CM | POA: Diagnosis not present

## 2018-11-08 DIAGNOSIS — M6281 Muscle weakness (generalized): Secondary | ICD-10-CM | POA: Diagnosis not present

## 2018-11-08 DIAGNOSIS — R296 Repeated falls: Secondary | ICD-10-CM | POA: Diagnosis not present

## 2018-11-08 DIAGNOSIS — R262 Difficulty in walking, not elsewhere classified: Secondary | ICD-10-CM | POA: Diagnosis not present

## 2018-11-09 DIAGNOSIS — M6281 Muscle weakness (generalized): Secondary | ICD-10-CM | POA: Diagnosis not present

## 2018-11-09 DIAGNOSIS — R296 Repeated falls: Secondary | ICD-10-CM | POA: Diagnosis not present

## 2018-11-09 DIAGNOSIS — R262 Difficulty in walking, not elsewhere classified: Secondary | ICD-10-CM | POA: Diagnosis not present

## 2018-11-12 DIAGNOSIS — R296 Repeated falls: Secondary | ICD-10-CM | POA: Diagnosis not present

## 2018-11-12 DIAGNOSIS — R1312 Dysphagia, oropharyngeal phase: Secondary | ICD-10-CM | POA: Diagnosis not present

## 2018-11-12 DIAGNOSIS — I150 Renovascular hypertension: Secondary | ICD-10-CM | POA: Diagnosis not present

## 2018-11-12 DIAGNOSIS — M6281 Muscle weakness (generalized): Secondary | ICD-10-CM | POA: Diagnosis not present

## 2018-11-12 DIAGNOSIS — E1149 Type 2 diabetes mellitus with other diabetic neurological complication: Secondary | ICD-10-CM | POA: Diagnosis not present

## 2018-11-12 DIAGNOSIS — E039 Hypothyroidism, unspecified: Secondary | ICD-10-CM | POA: Diagnosis not present

## 2018-11-12 DIAGNOSIS — N183 Chronic kidney disease, stage 3 (moderate): Secondary | ICD-10-CM | POA: Diagnosis not present

## 2018-11-13 DIAGNOSIS — M6281 Muscle weakness (generalized): Secondary | ICD-10-CM | POA: Diagnosis not present

## 2018-11-13 DIAGNOSIS — R1312 Dysphagia, oropharyngeal phase: Secondary | ICD-10-CM | POA: Diagnosis not present

## 2018-11-13 DIAGNOSIS — R296 Repeated falls: Secondary | ICD-10-CM | POA: Diagnosis not present

## 2018-11-17 DIAGNOSIS — M6281 Muscle weakness (generalized): Secondary | ICD-10-CM | POA: Diagnosis not present

## 2018-11-17 DIAGNOSIS — R1312 Dysphagia, oropharyngeal phase: Secondary | ICD-10-CM | POA: Diagnosis not present

## 2018-11-17 DIAGNOSIS — R296 Repeated falls: Secondary | ICD-10-CM | POA: Diagnosis not present

## 2018-11-22 DIAGNOSIS — R1312 Dysphagia, oropharyngeal phase: Secondary | ICD-10-CM | POA: Diagnosis not present

## 2018-11-22 DIAGNOSIS — R296 Repeated falls: Secondary | ICD-10-CM | POA: Diagnosis not present

## 2018-11-22 DIAGNOSIS — M6281 Muscle weakness (generalized): Secondary | ICD-10-CM | POA: Diagnosis not present

## 2018-11-23 DIAGNOSIS — R1312 Dysphagia, oropharyngeal phase: Secondary | ICD-10-CM | POA: Diagnosis not present

## 2018-11-23 DIAGNOSIS — M6281 Muscle weakness (generalized): Secondary | ICD-10-CM | POA: Diagnosis not present

## 2018-11-23 DIAGNOSIS — R296 Repeated falls: Secondary | ICD-10-CM | POA: Diagnosis not present

## 2018-11-24 DIAGNOSIS — I10 Essential (primary) hypertension: Secondary | ICD-10-CM | POA: Diagnosis not present

## 2018-11-24 DIAGNOSIS — E1149 Type 2 diabetes mellitus with other diabetic neurological complication: Secondary | ICD-10-CM | POA: Diagnosis not present

## 2018-11-24 DIAGNOSIS — G9341 Metabolic encephalopathy: Secondary | ICD-10-CM | POA: Diagnosis not present

## 2018-11-24 DIAGNOSIS — M6281 Muscle weakness (generalized): Secondary | ICD-10-CM | POA: Diagnosis not present

## 2018-11-24 DIAGNOSIS — E039 Hypothyroidism, unspecified: Secondary | ICD-10-CM | POA: Diagnosis not present

## 2018-11-24 DIAGNOSIS — R1312 Dysphagia, oropharyngeal phase: Secondary | ICD-10-CM | POA: Diagnosis not present

## 2018-11-24 DIAGNOSIS — R296 Repeated falls: Secondary | ICD-10-CM | POA: Diagnosis not present

## 2018-11-25 DIAGNOSIS — R296 Repeated falls: Secondary | ICD-10-CM | POA: Diagnosis not present

## 2018-11-25 DIAGNOSIS — R1312 Dysphagia, oropharyngeal phase: Secondary | ICD-10-CM | POA: Diagnosis not present

## 2018-11-25 DIAGNOSIS — M6281 Muscle weakness (generalized): Secondary | ICD-10-CM | POA: Diagnosis not present

## 2018-11-26 DIAGNOSIS — M6281 Muscle weakness (generalized): Secondary | ICD-10-CM | POA: Diagnosis not present

## 2018-11-26 DIAGNOSIS — R296 Repeated falls: Secondary | ICD-10-CM | POA: Diagnosis not present

## 2018-11-26 DIAGNOSIS — R1312 Dysphagia, oropharyngeal phase: Secondary | ICD-10-CM | POA: Diagnosis not present

## 2018-11-29 DIAGNOSIS — R1312 Dysphagia, oropharyngeal phase: Secondary | ICD-10-CM | POA: Diagnosis not present

## 2018-11-29 DIAGNOSIS — M6281 Muscle weakness (generalized): Secondary | ICD-10-CM | POA: Diagnosis not present

## 2018-11-29 DIAGNOSIS — R296 Repeated falls: Secondary | ICD-10-CM | POA: Diagnosis not present

## 2018-11-30 DIAGNOSIS — R1312 Dysphagia, oropharyngeal phase: Secondary | ICD-10-CM | POA: Diagnosis not present

## 2018-11-30 DIAGNOSIS — R296 Repeated falls: Secondary | ICD-10-CM | POA: Diagnosis not present

## 2018-11-30 DIAGNOSIS — M6281 Muscle weakness (generalized): Secondary | ICD-10-CM | POA: Diagnosis not present

## 2018-12-01 DIAGNOSIS — R296 Repeated falls: Secondary | ICD-10-CM | POA: Diagnosis not present

## 2018-12-01 DIAGNOSIS — R1312 Dysphagia, oropharyngeal phase: Secondary | ICD-10-CM | POA: Diagnosis not present

## 2018-12-01 DIAGNOSIS — M6281 Muscle weakness (generalized): Secondary | ICD-10-CM | POA: Diagnosis not present

## 2018-12-02 DIAGNOSIS — R1312 Dysphagia, oropharyngeal phase: Secondary | ICD-10-CM | POA: Diagnosis not present

## 2018-12-02 DIAGNOSIS — R296 Repeated falls: Secondary | ICD-10-CM | POA: Diagnosis not present

## 2018-12-02 DIAGNOSIS — M6281 Muscle weakness (generalized): Secondary | ICD-10-CM | POA: Diagnosis not present

## 2018-12-03 DIAGNOSIS — M6281 Muscle weakness (generalized): Secondary | ICD-10-CM | POA: Diagnosis not present

## 2018-12-03 DIAGNOSIS — R1312 Dysphagia, oropharyngeal phase: Secondary | ICD-10-CM | POA: Diagnosis not present

## 2018-12-03 DIAGNOSIS — R296 Repeated falls: Secondary | ICD-10-CM | POA: Diagnosis not present

## 2018-12-10 DIAGNOSIS — E785 Hyperlipidemia, unspecified: Secondary | ICD-10-CM | POA: Diagnosis not present

## 2018-12-10 DIAGNOSIS — E039 Hypothyroidism, unspecified: Secondary | ICD-10-CM | POA: Diagnosis not present

## 2018-12-10 DIAGNOSIS — I150 Renovascular hypertension: Secondary | ICD-10-CM | POA: Diagnosis not present

## 2018-12-10 DIAGNOSIS — E119 Type 2 diabetes mellitus without complications: Secondary | ICD-10-CM | POA: Diagnosis not present

## 2018-12-11 DIAGNOSIS — D559 Anemia due to enzyme disorder, unspecified: Secondary | ICD-10-CM | POA: Diagnosis not present

## 2018-12-11 DIAGNOSIS — E119 Type 2 diabetes mellitus without complications: Secondary | ICD-10-CM | POA: Diagnosis not present

## 2018-12-11 DIAGNOSIS — E038 Other specified hypothyroidism: Secondary | ICD-10-CM | POA: Diagnosis not present

## 2018-12-11 DIAGNOSIS — D518 Other vitamin B12 deficiency anemias: Secondary | ICD-10-CM | POA: Diagnosis not present

## 2018-12-17 DIAGNOSIS — E039 Hypothyroidism, unspecified: Secondary | ICD-10-CM | POA: Diagnosis not present

## 2018-12-19 DIAGNOSIS — E1149 Type 2 diabetes mellitus with other diabetic neurological complication: Secondary | ICD-10-CM | POA: Diagnosis not present

## 2018-12-19 DIAGNOSIS — E039 Hypothyroidism, unspecified: Secondary | ICD-10-CM | POA: Diagnosis not present

## 2018-12-19 DIAGNOSIS — I10 Essential (primary) hypertension: Secondary | ICD-10-CM | POA: Diagnosis not present

## 2018-12-25 DIAGNOSIS — Z20828 Contact with and (suspected) exposure to other viral communicable diseases: Secondary | ICD-10-CM | POA: Diagnosis not present

## 2019-01-06 DIAGNOSIS — Z20828 Contact with and (suspected) exposure to other viral communicable diseases: Secondary | ICD-10-CM | POA: Diagnosis not present

## 2019-01-06 DIAGNOSIS — E785 Hyperlipidemia, unspecified: Secondary | ICD-10-CM | POA: Diagnosis not present

## 2019-01-06 DIAGNOSIS — E1149 Type 2 diabetes mellitus with other diabetic neurological complication: Secondary | ICD-10-CM | POA: Diagnosis not present

## 2019-01-06 DIAGNOSIS — N183 Chronic kidney disease, stage 3 (moderate): Secondary | ICD-10-CM | POA: Diagnosis not present

## 2019-01-06 DIAGNOSIS — I150 Renovascular hypertension: Secondary | ICD-10-CM | POA: Diagnosis not present

## 2019-01-07 DIAGNOSIS — Z79899 Other long term (current) drug therapy: Secondary | ICD-10-CM | POA: Diagnosis not present

## 2019-01-07 DIAGNOSIS — E119 Type 2 diabetes mellitus without complications: Secondary | ICD-10-CM | POA: Diagnosis not present

## 2019-01-07 DIAGNOSIS — D649 Anemia, unspecified: Secondary | ICD-10-CM | POA: Diagnosis not present

## 2019-01-07 DIAGNOSIS — D519 Vitamin B12 deficiency anemia, unspecified: Secondary | ICD-10-CM | POA: Diagnosis not present

## 2019-01-07 DIAGNOSIS — E039 Hypothyroidism, unspecified: Secondary | ICD-10-CM | POA: Diagnosis not present

## 2019-01-07 DIAGNOSIS — R7989 Other specified abnormal findings of blood chemistry: Secondary | ICD-10-CM | POA: Diagnosis not present

## 2019-01-07 DIAGNOSIS — E785 Hyperlipidemia, unspecified: Secondary | ICD-10-CM | POA: Diagnosis not present

## 2019-01-08 DIAGNOSIS — E038 Other specified hypothyroidism: Secondary | ICD-10-CM | POA: Diagnosis not present

## 2019-01-08 DIAGNOSIS — E119 Type 2 diabetes mellitus without complications: Secondary | ICD-10-CM | POA: Diagnosis not present

## 2019-01-08 DIAGNOSIS — D559 Anemia due to enzyme disorder, unspecified: Secondary | ICD-10-CM | POA: Diagnosis not present

## 2019-01-08 DIAGNOSIS — D518 Other vitamin B12 deficiency anemias: Secondary | ICD-10-CM | POA: Diagnosis not present

## 2019-01-13 DIAGNOSIS — Z20828 Contact with and (suspected) exposure to other viral communicable diseases: Secondary | ICD-10-CM | POA: Diagnosis not present

## 2019-01-18 DIAGNOSIS — Z20828 Contact with and (suspected) exposure to other viral communicable diseases: Secondary | ICD-10-CM | POA: Diagnosis not present

## 2019-01-25 DIAGNOSIS — Z20828 Contact with and (suspected) exposure to other viral communicable diseases: Secondary | ICD-10-CM | POA: Diagnosis not present

## 2019-01-30 DIAGNOSIS — N183 Chronic kidney disease, stage 3 (moderate): Secondary | ICD-10-CM | POA: Diagnosis not present

## 2019-01-30 DIAGNOSIS — I2510 Atherosclerotic heart disease of native coronary artery without angina pectoris: Secondary | ICD-10-CM | POA: Diagnosis not present

## 2019-01-30 DIAGNOSIS — E1149 Type 2 diabetes mellitus with other diabetic neurological complication: Secondary | ICD-10-CM | POA: Diagnosis not present

## 2019-01-30 DIAGNOSIS — E039 Hypothyroidism, unspecified: Secondary | ICD-10-CM | POA: Diagnosis not present

## 2019-01-31 DIAGNOSIS — N183 Chronic kidney disease, stage 3 (moderate): Secondary | ICD-10-CM | POA: Diagnosis not present

## 2019-01-31 DIAGNOSIS — Z03818 Encounter for observation for suspected exposure to other biological agents ruled out: Secondary | ICD-10-CM | POA: Diagnosis not present

## 2019-01-31 DIAGNOSIS — E1149 Type 2 diabetes mellitus with other diabetic neurological complication: Secondary | ICD-10-CM | POA: Diagnosis not present

## 2019-01-31 DIAGNOSIS — I150 Renovascular hypertension: Secondary | ICD-10-CM | POA: Diagnosis not present

## 2019-01-31 DIAGNOSIS — M810 Age-related osteoporosis without current pathological fracture: Secondary | ICD-10-CM | POA: Diagnosis not present

## 2019-02-01 DIAGNOSIS — Z20828 Contact with and (suspected) exposure to other viral communicable diseases: Secondary | ICD-10-CM | POA: Diagnosis not present

## 2019-02-08 DIAGNOSIS — E119 Type 2 diabetes mellitus without complications: Secondary | ICD-10-CM | POA: Diagnosis not present

## 2019-02-08 DIAGNOSIS — D518 Other vitamin B12 deficiency anemias: Secondary | ICD-10-CM | POA: Diagnosis not present

## 2019-02-08 DIAGNOSIS — D559 Anemia due to enzyme disorder, unspecified: Secondary | ICD-10-CM | POA: Diagnosis not present

## 2019-02-08 DIAGNOSIS — E038 Other specified hypothyroidism: Secondary | ICD-10-CM | POA: Diagnosis not present

## 2019-02-09 DIAGNOSIS — Z03818 Encounter for observation for suspected exposure to other biological agents ruled out: Secondary | ICD-10-CM | POA: Diagnosis not present

## 2019-02-14 ENCOUNTER — Other Ambulatory Visit: Payer: Self-pay

## 2019-02-14 DIAGNOSIS — Z03818 Encounter for observation for suspected exposure to other biological agents ruled out: Secondary | ICD-10-CM | POA: Diagnosis not present

## 2019-02-21 DIAGNOSIS — Z03818 Encounter for observation for suspected exposure to other biological agents ruled out: Secondary | ICD-10-CM | POA: Diagnosis not present

## 2019-02-28 DIAGNOSIS — Z03818 Encounter for observation for suspected exposure to other biological agents ruled out: Secondary | ICD-10-CM | POA: Diagnosis not present

## 2019-02-28 DIAGNOSIS — E785 Hyperlipidemia, unspecified: Secondary | ICD-10-CM | POA: Diagnosis not present

## 2019-02-28 DIAGNOSIS — R630 Anorexia: Secondary | ICD-10-CM | POA: Diagnosis not present

## 2019-02-28 DIAGNOSIS — I69054 Hemiplegia and hemiparesis following nontraumatic subarachnoid hemorrhage affecting left non-dominant side: Secondary | ICD-10-CM | POA: Diagnosis not present

## 2019-02-28 DIAGNOSIS — I150 Renovascular hypertension: Secondary | ICD-10-CM | POA: Diagnosis not present

## 2019-03-01 DIAGNOSIS — E0590 Thyrotoxicosis, unspecified without thyrotoxic crisis or storm: Secondary | ICD-10-CM | POA: Diagnosis not present

## 2019-03-01 DIAGNOSIS — D649 Anemia, unspecified: Secondary | ICD-10-CM | POA: Diagnosis not present

## 2019-03-01 DIAGNOSIS — D440 Neoplasm of uncertain behavior of thyroid gland: Secondary | ICD-10-CM | POA: Diagnosis not present

## 2019-03-01 DIAGNOSIS — E119 Type 2 diabetes mellitus without complications: Secondary | ICD-10-CM | POA: Diagnosis not present

## 2019-03-01 DIAGNOSIS — E039 Hypothyroidism, unspecified: Secondary | ICD-10-CM | POA: Diagnosis not present

## 2019-03-01 DIAGNOSIS — E878 Other disorders of electrolyte and fluid balance, not elsewhere classified: Secondary | ICD-10-CM | POA: Diagnosis not present

## 2019-03-04 DIAGNOSIS — M6281 Muscle weakness (generalized): Secondary | ICD-10-CM | POA: Diagnosis not present

## 2019-03-04 DIAGNOSIS — R262 Difficulty in walking, not elsewhere classified: Secondary | ICD-10-CM | POA: Diagnosis not present

## 2019-03-07 DIAGNOSIS — Z03818 Encounter for observation for suspected exposure to other biological agents ruled out: Secondary | ICD-10-CM | POA: Diagnosis not present

## 2019-03-07 DIAGNOSIS — R262 Difficulty in walking, not elsewhere classified: Secondary | ICD-10-CM | POA: Diagnosis not present

## 2019-03-07 DIAGNOSIS — M6281 Muscle weakness (generalized): Secondary | ICD-10-CM | POA: Diagnosis not present

## 2019-03-08 DIAGNOSIS — R262 Difficulty in walking, not elsewhere classified: Secondary | ICD-10-CM | POA: Diagnosis not present

## 2019-03-08 DIAGNOSIS — M6281 Muscle weakness (generalized): Secondary | ICD-10-CM | POA: Diagnosis not present

## 2019-03-09 DIAGNOSIS — R262 Difficulty in walking, not elsewhere classified: Secondary | ICD-10-CM | POA: Diagnosis not present

## 2019-03-09 DIAGNOSIS — M6281 Muscle weakness (generalized): Secondary | ICD-10-CM | POA: Diagnosis not present

## 2019-03-10 DIAGNOSIS — M6281 Muscle weakness (generalized): Secondary | ICD-10-CM | POA: Diagnosis not present

## 2019-03-10 DIAGNOSIS — R262 Difficulty in walking, not elsewhere classified: Secondary | ICD-10-CM | POA: Diagnosis not present

## 2019-03-13 DIAGNOSIS — E1149 Type 2 diabetes mellitus with other diabetic neurological complication: Secondary | ICD-10-CM | POA: Diagnosis not present

## 2019-03-13 DIAGNOSIS — M810 Age-related osteoporosis without current pathological fracture: Secondary | ICD-10-CM | POA: Diagnosis not present

## 2019-03-13 DIAGNOSIS — I10 Essential (primary) hypertension: Secondary | ICD-10-CM | POA: Diagnosis not present

## 2019-03-13 DIAGNOSIS — E039 Hypothyroidism, unspecified: Secondary | ICD-10-CM | POA: Diagnosis not present

## 2019-03-14 DIAGNOSIS — Z03818 Encounter for observation for suspected exposure to other biological agents ruled out: Secondary | ICD-10-CM | POA: Diagnosis not present

## 2019-03-14 DIAGNOSIS — M6281 Muscle weakness (generalized): Secondary | ICD-10-CM | POA: Diagnosis not present

## 2019-03-14 DIAGNOSIS — R262 Difficulty in walking, not elsewhere classified: Secondary | ICD-10-CM | POA: Diagnosis not present

## 2019-03-15 DIAGNOSIS — R262 Difficulty in walking, not elsewhere classified: Secondary | ICD-10-CM | POA: Diagnosis not present

## 2019-03-15 DIAGNOSIS — R1312 Dysphagia, oropharyngeal phase: Secondary | ICD-10-CM | POA: Diagnosis not present

## 2019-03-16 DIAGNOSIS — R262 Difficulty in walking, not elsewhere classified: Secondary | ICD-10-CM | POA: Diagnosis not present

## 2019-03-16 DIAGNOSIS — R1312 Dysphagia, oropharyngeal phase: Secondary | ICD-10-CM | POA: Diagnosis not present

## 2019-03-17 DIAGNOSIS — Z03818 Encounter for observation for suspected exposure to other biological agents ruled out: Secondary | ICD-10-CM | POA: Diagnosis not present

## 2019-03-17 DIAGNOSIS — R262 Difficulty in walking, not elsewhere classified: Secondary | ICD-10-CM | POA: Diagnosis not present

## 2019-03-17 DIAGNOSIS — R1312 Dysphagia, oropharyngeal phase: Secondary | ICD-10-CM | POA: Diagnosis not present

## 2019-03-18 DIAGNOSIS — R262 Difficulty in walking, not elsewhere classified: Secondary | ICD-10-CM | POA: Diagnosis not present

## 2019-03-18 DIAGNOSIS — R1312 Dysphagia, oropharyngeal phase: Secondary | ICD-10-CM | POA: Diagnosis not present

## 2019-03-21 DIAGNOSIS — Z03818 Encounter for observation for suspected exposure to other biological agents ruled out: Secondary | ICD-10-CM | POA: Diagnosis not present

## 2019-03-23 DIAGNOSIS — R1312 Dysphagia, oropharyngeal phase: Secondary | ICD-10-CM | POA: Diagnosis not present

## 2019-03-23 DIAGNOSIS — R262 Difficulty in walking, not elsewhere classified: Secondary | ICD-10-CM | POA: Diagnosis not present

## 2019-03-24 DIAGNOSIS — R1312 Dysphagia, oropharyngeal phase: Secondary | ICD-10-CM | POA: Diagnosis not present

## 2019-03-24 DIAGNOSIS — Z03818 Encounter for observation for suspected exposure to other biological agents ruled out: Secondary | ICD-10-CM | POA: Diagnosis not present

## 2019-03-24 DIAGNOSIS — R262 Difficulty in walking, not elsewhere classified: Secondary | ICD-10-CM | POA: Diagnosis not present

## 2019-03-27 DIAGNOSIS — R262 Difficulty in walking, not elsewhere classified: Secondary | ICD-10-CM | POA: Diagnosis not present

## 2019-03-27 DIAGNOSIS — R1312 Dysphagia, oropharyngeal phase: Secondary | ICD-10-CM | POA: Diagnosis not present

## 2019-03-28 DIAGNOSIS — R1312 Dysphagia, oropharyngeal phase: Secondary | ICD-10-CM | POA: Diagnosis not present

## 2019-03-28 DIAGNOSIS — R262 Difficulty in walking, not elsewhere classified: Secondary | ICD-10-CM | POA: Diagnosis not present

## 2019-03-28 DIAGNOSIS — N183 Chronic kidney disease, stage 3 (moderate): Secondary | ICD-10-CM | POA: Diagnosis not present

## 2019-03-28 DIAGNOSIS — I69054 Hemiplegia and hemiparesis following nontraumatic subarachnoid hemorrhage affecting left non-dominant side: Secondary | ICD-10-CM | POA: Diagnosis not present

## 2019-03-28 DIAGNOSIS — I10 Essential (primary) hypertension: Secondary | ICD-10-CM | POA: Diagnosis not present

## 2019-03-28 DIAGNOSIS — E1149 Type 2 diabetes mellitus with other diabetic neurological complication: Secondary | ICD-10-CM | POA: Diagnosis not present

## 2019-03-28 DIAGNOSIS — Z03818 Encounter for observation for suspected exposure to other biological agents ruled out: Secondary | ICD-10-CM | POA: Diagnosis not present

## 2019-03-29 DIAGNOSIS — R1312 Dysphagia, oropharyngeal phase: Secondary | ICD-10-CM | POA: Diagnosis not present

## 2019-03-29 DIAGNOSIS — R262 Difficulty in walking, not elsewhere classified: Secondary | ICD-10-CM | POA: Diagnosis not present

## 2019-03-30 DIAGNOSIS — R262 Difficulty in walking, not elsewhere classified: Secondary | ICD-10-CM | POA: Diagnosis not present

## 2019-03-30 DIAGNOSIS — R1312 Dysphagia, oropharyngeal phase: Secondary | ICD-10-CM | POA: Diagnosis not present

## 2019-03-31 DIAGNOSIS — R262 Difficulty in walking, not elsewhere classified: Secondary | ICD-10-CM | POA: Diagnosis not present

## 2019-03-31 DIAGNOSIS — R1312 Dysphagia, oropharyngeal phase: Secondary | ICD-10-CM | POA: Diagnosis not present

## 2019-04-01 DIAGNOSIS — R1312 Dysphagia, oropharyngeal phase: Secondary | ICD-10-CM | POA: Diagnosis not present

## 2019-04-01 DIAGNOSIS — R262 Difficulty in walking, not elsewhere classified: Secondary | ICD-10-CM | POA: Diagnosis not present

## 2019-04-02 DIAGNOSIS — R262 Difficulty in walking, not elsewhere classified: Secondary | ICD-10-CM | POA: Diagnosis not present

## 2019-04-02 DIAGNOSIS — R1312 Dysphagia, oropharyngeal phase: Secondary | ICD-10-CM | POA: Diagnosis not present

## 2019-04-03 DIAGNOSIS — E039 Hypothyroidism, unspecified: Secondary | ICD-10-CM | POA: Diagnosis not present

## 2019-04-03 DIAGNOSIS — I2510 Atherosclerotic heart disease of native coronary artery without angina pectoris: Secondary | ICD-10-CM | POA: Diagnosis not present

## 2019-04-03 DIAGNOSIS — R1312 Dysphagia, oropharyngeal phase: Secondary | ICD-10-CM | POA: Diagnosis not present

## 2019-04-03 DIAGNOSIS — I10 Essential (primary) hypertension: Secondary | ICD-10-CM | POA: Diagnosis not present

## 2019-04-03 DIAGNOSIS — R262 Difficulty in walking, not elsewhere classified: Secondary | ICD-10-CM | POA: Diagnosis not present

## 2019-04-03 DIAGNOSIS — E1149 Type 2 diabetes mellitus with other diabetic neurological complication: Secondary | ICD-10-CM | POA: Diagnosis not present

## 2019-04-04 DIAGNOSIS — R262 Difficulty in walking, not elsewhere classified: Secondary | ICD-10-CM | POA: Diagnosis not present

## 2019-04-04 DIAGNOSIS — R1312 Dysphagia, oropharyngeal phase: Secondary | ICD-10-CM | POA: Diagnosis not present

## 2019-04-05 DIAGNOSIS — R262 Difficulty in walking, not elsewhere classified: Secondary | ICD-10-CM | POA: Diagnosis not present

## 2019-04-05 DIAGNOSIS — R1312 Dysphagia, oropharyngeal phase: Secondary | ICD-10-CM | POA: Diagnosis not present

## 2019-04-06 DIAGNOSIS — R1312 Dysphagia, oropharyngeal phase: Secondary | ICD-10-CM | POA: Diagnosis not present

## 2019-04-06 DIAGNOSIS — R262 Difficulty in walking, not elsewhere classified: Secondary | ICD-10-CM | POA: Diagnosis not present

## 2019-04-07 DIAGNOSIS — R262 Difficulty in walking, not elsewhere classified: Secondary | ICD-10-CM | POA: Diagnosis not present

## 2019-04-07 DIAGNOSIS — R1312 Dysphagia, oropharyngeal phase: Secondary | ICD-10-CM | POA: Diagnosis not present

## 2019-04-08 DIAGNOSIS — R1312 Dysphagia, oropharyngeal phase: Secondary | ICD-10-CM | POA: Diagnosis not present

## 2019-04-08 DIAGNOSIS — R262 Difficulty in walking, not elsewhere classified: Secondary | ICD-10-CM | POA: Diagnosis not present

## 2019-04-10 DIAGNOSIS — R1312 Dysphagia, oropharyngeal phase: Secondary | ICD-10-CM | POA: Diagnosis not present

## 2019-04-10 DIAGNOSIS — R262 Difficulty in walking, not elsewhere classified: Secondary | ICD-10-CM | POA: Diagnosis not present

## 2019-04-11 DIAGNOSIS — R262 Difficulty in walking, not elsewhere classified: Secondary | ICD-10-CM | POA: Diagnosis not present

## 2019-04-11 DIAGNOSIS — R1312 Dysphagia, oropharyngeal phase: Secondary | ICD-10-CM | POA: Diagnosis not present

## 2019-04-12 DIAGNOSIS — R262 Difficulty in walking, not elsewhere classified: Secondary | ICD-10-CM | POA: Diagnosis not present

## 2019-04-12 DIAGNOSIS — E119 Type 2 diabetes mellitus without complications: Secondary | ICD-10-CM | POA: Diagnosis not present

## 2019-04-12 DIAGNOSIS — D518 Other vitamin B12 deficiency anemias: Secondary | ICD-10-CM | POA: Diagnosis not present

## 2019-04-12 DIAGNOSIS — E038 Other specified hypothyroidism: Secondary | ICD-10-CM | POA: Diagnosis not present

## 2019-04-12 DIAGNOSIS — R1312 Dysphagia, oropharyngeal phase: Secondary | ICD-10-CM | POA: Diagnosis not present

## 2019-04-12 DIAGNOSIS — D559 Anemia due to enzyme disorder, unspecified: Secondary | ICD-10-CM | POA: Diagnosis not present

## 2019-04-13 DIAGNOSIS — R1312 Dysphagia, oropharyngeal phase: Secondary | ICD-10-CM | POA: Diagnosis not present

## 2019-04-13 DIAGNOSIS — R262 Difficulty in walking, not elsewhere classified: Secondary | ICD-10-CM | POA: Diagnosis not present

## 2019-04-14 DIAGNOSIS — R1312 Dysphagia, oropharyngeal phase: Secondary | ICD-10-CM | POA: Diagnosis not present

## 2019-04-14 DIAGNOSIS — R41841 Cognitive communication deficit: Secondary | ICD-10-CM | POA: Diagnosis not present

## 2019-04-15 DIAGNOSIS — R41841 Cognitive communication deficit: Secondary | ICD-10-CM | POA: Diagnosis not present

## 2019-04-15 DIAGNOSIS — R1312 Dysphagia, oropharyngeal phase: Secondary | ICD-10-CM | POA: Diagnosis not present

## 2019-04-17 DIAGNOSIS — R41841 Cognitive communication deficit: Secondary | ICD-10-CM | POA: Diagnosis not present

## 2019-04-17 DIAGNOSIS — R1312 Dysphagia, oropharyngeal phase: Secondary | ICD-10-CM | POA: Diagnosis not present

## 2019-04-19 DIAGNOSIS — R41841 Cognitive communication deficit: Secondary | ICD-10-CM | POA: Diagnosis not present

## 2019-04-19 DIAGNOSIS — R1312 Dysphagia, oropharyngeal phase: Secondary | ICD-10-CM | POA: Diagnosis not present

## 2019-04-20 DIAGNOSIS — R1312 Dysphagia, oropharyngeal phase: Secondary | ICD-10-CM | POA: Diagnosis not present

## 2019-04-20 DIAGNOSIS — R41841 Cognitive communication deficit: Secondary | ICD-10-CM | POA: Diagnosis not present

## 2019-04-21 DIAGNOSIS — R1312 Dysphagia, oropharyngeal phase: Secondary | ICD-10-CM | POA: Diagnosis not present

## 2019-04-21 DIAGNOSIS — R41841 Cognitive communication deficit: Secondary | ICD-10-CM | POA: Diagnosis not present

## 2019-04-22 DIAGNOSIS — R1312 Dysphagia, oropharyngeal phase: Secondary | ICD-10-CM | POA: Diagnosis not present

## 2019-04-22 DIAGNOSIS — R41841 Cognitive communication deficit: Secondary | ICD-10-CM | POA: Diagnosis not present

## 2019-04-25 DIAGNOSIS — R1312 Dysphagia, oropharyngeal phase: Secondary | ICD-10-CM | POA: Diagnosis not present

## 2019-04-25 DIAGNOSIS — R41841 Cognitive communication deficit: Secondary | ICD-10-CM | POA: Diagnosis not present

## 2019-04-26 DIAGNOSIS — R1312 Dysphagia, oropharyngeal phase: Secondary | ICD-10-CM | POA: Diagnosis not present

## 2019-04-26 DIAGNOSIS — R41841 Cognitive communication deficit: Secondary | ICD-10-CM | POA: Diagnosis not present

## 2019-04-27 DIAGNOSIS — R1312 Dysphagia, oropharyngeal phase: Secondary | ICD-10-CM | POA: Diagnosis not present

## 2019-04-27 DIAGNOSIS — R41841 Cognitive communication deficit: Secondary | ICD-10-CM | POA: Diagnosis not present

## 2019-04-28 DIAGNOSIS — R1312 Dysphagia, oropharyngeal phase: Secondary | ICD-10-CM | POA: Diagnosis not present

## 2019-04-28 DIAGNOSIS — R41841 Cognitive communication deficit: Secondary | ICD-10-CM | POA: Diagnosis not present

## 2019-04-29 DIAGNOSIS — R41841 Cognitive communication deficit: Secondary | ICD-10-CM | POA: Diagnosis not present

## 2019-04-29 DIAGNOSIS — R1312 Dysphagia, oropharyngeal phase: Secondary | ICD-10-CM | POA: Diagnosis not present

## 2019-04-30 DIAGNOSIS — R1312 Dysphagia, oropharyngeal phase: Secondary | ICD-10-CM | POA: Diagnosis not present

## 2019-04-30 DIAGNOSIS — R41841 Cognitive communication deficit: Secondary | ICD-10-CM | POA: Diagnosis not present

## 2019-05-01 DIAGNOSIS — R1312 Dysphagia, oropharyngeal phase: Secondary | ICD-10-CM | POA: Diagnosis not present

## 2019-05-01 DIAGNOSIS — R41841 Cognitive communication deficit: Secondary | ICD-10-CM | POA: Diagnosis not present

## 2019-05-02 DIAGNOSIS — R41841 Cognitive communication deficit: Secondary | ICD-10-CM | POA: Diagnosis not present

## 2019-05-02 DIAGNOSIS — R1312 Dysphagia, oropharyngeal phase: Secondary | ICD-10-CM | POA: Diagnosis not present

## 2019-05-03 DIAGNOSIS — R41841 Cognitive communication deficit: Secondary | ICD-10-CM | POA: Diagnosis not present

## 2019-05-03 DIAGNOSIS — R519 Headache, unspecified: Secondary | ICD-10-CM | POA: Diagnosis not present

## 2019-05-03 DIAGNOSIS — R1312 Dysphagia, oropharyngeal phase: Secondary | ICD-10-CM | POA: Diagnosis not present

## 2019-05-04 DIAGNOSIS — R41841 Cognitive communication deficit: Secondary | ICD-10-CM | POA: Diagnosis not present

## 2019-05-04 DIAGNOSIS — R1312 Dysphagia, oropharyngeal phase: Secondary | ICD-10-CM | POA: Diagnosis not present

## 2019-05-05 DIAGNOSIS — R1312 Dysphagia, oropharyngeal phase: Secondary | ICD-10-CM | POA: Diagnosis not present

## 2019-05-05 DIAGNOSIS — R41841 Cognitive communication deficit: Secondary | ICD-10-CM | POA: Diagnosis not present

## 2019-05-06 DIAGNOSIS — R1312 Dysphagia, oropharyngeal phase: Secondary | ICD-10-CM | POA: Diagnosis not present

## 2019-05-06 DIAGNOSIS — R41841 Cognitive communication deficit: Secondary | ICD-10-CM | POA: Diagnosis not present

## 2019-05-08 DIAGNOSIS — R41841 Cognitive communication deficit: Secondary | ICD-10-CM | POA: Diagnosis not present

## 2019-05-08 DIAGNOSIS — R1312 Dysphagia, oropharyngeal phase: Secondary | ICD-10-CM | POA: Diagnosis not present

## 2019-05-09 DIAGNOSIS — R1312 Dysphagia, oropharyngeal phase: Secondary | ICD-10-CM | POA: Diagnosis not present

## 2019-05-09 DIAGNOSIS — R41841 Cognitive communication deficit: Secondary | ICD-10-CM | POA: Diagnosis not present

## 2019-05-11 DIAGNOSIS — R1312 Dysphagia, oropharyngeal phase: Secondary | ICD-10-CM | POA: Diagnosis not present

## 2019-05-11 DIAGNOSIS — R41841 Cognitive communication deficit: Secondary | ICD-10-CM | POA: Diagnosis not present

## 2019-05-20 DIAGNOSIS — I10 Essential (primary) hypertension: Secondary | ICD-10-CM | POA: Diagnosis not present

## 2019-05-20 DIAGNOSIS — E039 Hypothyroidism, unspecified: Secondary | ICD-10-CM | POA: Diagnosis not present

## 2019-05-20 DIAGNOSIS — G309 Alzheimer's disease, unspecified: Secondary | ICD-10-CM | POA: Diagnosis not present

## 2019-05-20 DIAGNOSIS — E1122 Type 2 diabetes mellitus with diabetic chronic kidney disease: Secondary | ICD-10-CM | POA: Diagnosis not present

## 2019-05-20 DIAGNOSIS — E782 Mixed hyperlipidemia: Secondary | ICD-10-CM | POA: Diagnosis not present

## 2019-08-22 DIAGNOSIS — Z8679 Personal history of other diseases of the circulatory system: Secondary | ICD-10-CM | POA: Diagnosis not present

## 2019-08-22 DIAGNOSIS — E782 Mixed hyperlipidemia: Secondary | ICD-10-CM | POA: Diagnosis not present

## 2019-08-22 DIAGNOSIS — E039 Hypothyroidism, unspecified: Secondary | ICD-10-CM | POA: Diagnosis not present

## 2019-08-22 DIAGNOSIS — G309 Alzheimer's disease, unspecified: Secondary | ICD-10-CM | POA: Diagnosis not present

## 2019-08-22 DIAGNOSIS — I10 Essential (primary) hypertension: Secondary | ICD-10-CM | POA: Diagnosis not present

## 2019-08-22 DIAGNOSIS — E1122 Type 2 diabetes mellitus with diabetic chronic kidney disease: Secondary | ICD-10-CM | POA: Diagnosis not present

## 2019-08-24 DIAGNOSIS — E039 Hypothyroidism, unspecified: Secondary | ICD-10-CM | POA: Diagnosis not present

## 2019-08-24 DIAGNOSIS — E119 Type 2 diabetes mellitus without complications: Secondary | ICD-10-CM | POA: Diagnosis not present

## 2019-08-24 DIAGNOSIS — I119 Hypertensive heart disease without heart failure: Secondary | ICD-10-CM | POA: Diagnosis not present

## 2019-08-24 DIAGNOSIS — I69054 Hemiplegia and hemiparesis following nontraumatic subarachnoid hemorrhage affecting left non-dominant side: Secondary | ICD-10-CM | POA: Diagnosis not present

## 2019-08-25 DIAGNOSIS — E1149 Type 2 diabetes mellitus with other diabetic neurological complication: Secondary | ICD-10-CM | POA: Diagnosis not present

## 2019-08-25 DIAGNOSIS — R278 Other lack of coordination: Secondary | ICD-10-CM | POA: Diagnosis not present

## 2019-08-25 DIAGNOSIS — E785 Hyperlipidemia, unspecified: Secondary | ICD-10-CM | POA: Diagnosis not present

## 2019-08-25 DIAGNOSIS — G9341 Metabolic encephalopathy: Secondary | ICD-10-CM | POA: Diagnosis not present

## 2019-08-25 DIAGNOSIS — M6281 Muscle weakness (generalized): Secondary | ICD-10-CM | POA: Diagnosis not present

## 2019-08-25 DIAGNOSIS — N183 Chronic kidney disease, stage 3 unspecified: Secondary | ICD-10-CM | POA: Diagnosis not present

## 2019-08-25 DIAGNOSIS — E559 Vitamin D deficiency, unspecified: Secondary | ICD-10-CM | POA: Diagnosis not present

## 2019-08-25 DIAGNOSIS — E039 Hypothyroidism, unspecified: Secondary | ICD-10-CM | POA: Diagnosis not present

## 2019-08-25 DIAGNOSIS — D649 Anemia, unspecified: Secondary | ICD-10-CM | POA: Diagnosis not present

## 2019-08-25 DIAGNOSIS — Z79899 Other long term (current) drug therapy: Secondary | ICD-10-CM | POA: Diagnosis not present

## 2019-08-25 DIAGNOSIS — R262 Difficulty in walking, not elsewhere classified: Secondary | ICD-10-CM | POA: Diagnosis not present

## 2019-08-25 DIAGNOSIS — E119 Type 2 diabetes mellitus without complications: Secondary | ICD-10-CM | POA: Diagnosis not present

## 2019-08-29 DIAGNOSIS — R262 Difficulty in walking, not elsewhere classified: Secondary | ICD-10-CM | POA: Diagnosis not present

## 2019-08-29 DIAGNOSIS — M6281 Muscle weakness (generalized): Secondary | ICD-10-CM | POA: Diagnosis not present

## 2019-08-29 DIAGNOSIS — R278 Other lack of coordination: Secondary | ICD-10-CM | POA: Diagnosis not present

## 2019-08-30 DIAGNOSIS — R278 Other lack of coordination: Secondary | ICD-10-CM | POA: Diagnosis not present

## 2019-08-30 DIAGNOSIS — M6281 Muscle weakness (generalized): Secondary | ICD-10-CM | POA: Diagnosis not present

## 2019-08-30 DIAGNOSIS — R262 Difficulty in walking, not elsewhere classified: Secondary | ICD-10-CM | POA: Diagnosis not present

## 2019-08-31 DIAGNOSIS — R278 Other lack of coordination: Secondary | ICD-10-CM | POA: Diagnosis not present

## 2019-08-31 DIAGNOSIS — R569 Unspecified convulsions: Secondary | ICD-10-CM | POA: Diagnosis not present

## 2019-08-31 DIAGNOSIS — M6281 Muscle weakness (generalized): Secondary | ICD-10-CM | POA: Diagnosis not present

## 2019-08-31 DIAGNOSIS — E119 Type 2 diabetes mellitus without complications: Secondary | ICD-10-CM | POA: Diagnosis not present

## 2019-08-31 DIAGNOSIS — R262 Difficulty in walking, not elsewhere classified: Secondary | ICD-10-CM | POA: Diagnosis not present

## 2019-08-31 DIAGNOSIS — I69054 Hemiplegia and hemiparesis following nontraumatic subarachnoid hemorrhage affecting left non-dominant side: Secondary | ICD-10-CM | POA: Diagnosis not present

## 2019-08-31 DIAGNOSIS — I119 Hypertensive heart disease without heart failure: Secondary | ICD-10-CM | POA: Diagnosis not present

## 2019-09-01 DIAGNOSIS — R278 Other lack of coordination: Secondary | ICD-10-CM | POA: Diagnosis not present

## 2019-09-01 DIAGNOSIS — M6281 Muscle weakness (generalized): Secondary | ICD-10-CM | POA: Diagnosis not present

## 2019-09-01 DIAGNOSIS — R262 Difficulty in walking, not elsewhere classified: Secondary | ICD-10-CM | POA: Diagnosis not present

## 2019-09-02 DIAGNOSIS — M810 Age-related osteoporosis without current pathological fracture: Secondary | ICD-10-CM | POA: Diagnosis not present

## 2019-09-02 DIAGNOSIS — E039 Hypothyroidism, unspecified: Secondary | ICD-10-CM | POA: Diagnosis not present

## 2019-09-02 DIAGNOSIS — I2510 Atherosclerotic heart disease of native coronary artery without angina pectoris: Secondary | ICD-10-CM | POA: Diagnosis not present

## 2019-09-02 DIAGNOSIS — I10 Essential (primary) hypertension: Secondary | ICD-10-CM | POA: Diagnosis not present

## 2019-09-02 DIAGNOSIS — M6281 Muscle weakness (generalized): Secondary | ICD-10-CM | POA: Diagnosis not present

## 2019-09-02 DIAGNOSIS — R278 Other lack of coordination: Secondary | ICD-10-CM | POA: Diagnosis not present

## 2019-09-02 DIAGNOSIS — R262 Difficulty in walking, not elsewhere classified: Secondary | ICD-10-CM | POA: Diagnosis not present

## 2019-09-04 DIAGNOSIS — R296 Repeated falls: Secondary | ICD-10-CM | POA: Diagnosis not present

## 2019-09-04 DIAGNOSIS — M6281 Muscle weakness (generalized): Secondary | ICD-10-CM | POA: Diagnosis not present

## 2019-09-04 DIAGNOSIS — R278 Other lack of coordination: Secondary | ICD-10-CM | POA: Diagnosis not present

## 2019-09-04 DIAGNOSIS — R262 Difficulty in walking, not elsewhere classified: Secondary | ICD-10-CM | POA: Diagnosis not present

## 2019-09-05 DIAGNOSIS — G301 Alzheimer's disease with late onset: Secondary | ICD-10-CM | POA: Diagnosis not present

## 2019-09-05 DIAGNOSIS — R296 Repeated falls: Secondary | ICD-10-CM | POA: Diagnosis not present

## 2019-09-05 DIAGNOSIS — Z03818 Encounter for observation for suspected exposure to other biological agents ruled out: Secondary | ICD-10-CM | POA: Diagnosis not present

## 2019-09-05 DIAGNOSIS — R05 Cough: Secondary | ICD-10-CM | POA: Diagnosis not present

## 2019-09-06 DIAGNOSIS — I10 Essential (primary) hypertension: Secondary | ICD-10-CM | POA: Diagnosis not present

## 2019-09-06 DIAGNOSIS — R278 Other lack of coordination: Secondary | ICD-10-CM | POA: Diagnosis not present

## 2019-09-06 DIAGNOSIS — R262 Difficulty in walking, not elsewhere classified: Secondary | ICD-10-CM | POA: Diagnosis not present

## 2019-09-06 DIAGNOSIS — R05 Cough: Secondary | ICD-10-CM | POA: Diagnosis not present

## 2019-09-06 DIAGNOSIS — D649 Anemia, unspecified: Secondary | ICD-10-CM | POA: Diagnosis not present

## 2019-09-06 DIAGNOSIS — E1149 Type 2 diabetes mellitus with other diabetic neurological complication: Secondary | ICD-10-CM | POA: Diagnosis not present

## 2019-09-06 DIAGNOSIS — M6281 Muscle weakness (generalized): Secondary | ICD-10-CM | POA: Diagnosis not present

## 2019-09-07 DIAGNOSIS — B9729 Other coronavirus as the cause of diseases classified elsewhere: Secondary | ICD-10-CM | POA: Diagnosis not present

## 2019-09-07 DIAGNOSIS — R296 Repeated falls: Secondary | ICD-10-CM | POA: Diagnosis not present

## 2019-09-08 DIAGNOSIS — E0590 Thyrotoxicosis, unspecified without thyrotoxic crisis or storm: Secondary | ICD-10-CM | POA: Diagnosis not present

## 2019-09-08 DIAGNOSIS — D440 Neoplasm of uncertain behavior of thyroid gland: Secondary | ICD-10-CM | POA: Diagnosis not present

## 2019-09-08 DIAGNOSIS — D649 Anemia, unspecified: Secondary | ICD-10-CM | POA: Diagnosis not present

## 2019-09-08 DIAGNOSIS — E039 Hypothyroidism, unspecified: Secondary | ICD-10-CM | POA: Diagnosis not present

## 2019-09-09 DIAGNOSIS — B9729 Other coronavirus as the cause of diseases classified elsewhere: Secondary | ICD-10-CM | POA: Diagnosis not present

## 2019-09-09 DIAGNOSIS — S2231XD Fracture of one rib, right side, subsequent encounter for fracture with routine healing: Secondary | ICD-10-CM | POA: Diagnosis not present

## 2019-09-09 DIAGNOSIS — R079 Chest pain, unspecified: Secondary | ICD-10-CM | POA: Diagnosis not present

## 2019-09-09 DIAGNOSIS — M810 Age-related osteoporosis without current pathological fracture: Secondary | ICD-10-CM | POA: Diagnosis not present

## 2019-09-09 DIAGNOSIS — R296 Repeated falls: Secondary | ICD-10-CM | POA: Diagnosis not present

## 2019-09-12 DIAGNOSIS — Z03818 Encounter for observation for suspected exposure to other biological agents ruled out: Secondary | ICD-10-CM | POA: Diagnosis not present

## 2019-09-13 DIAGNOSIS — D649 Anemia, unspecified: Secondary | ICD-10-CM | POA: Diagnosis not present

## 2019-09-13 DIAGNOSIS — N183 Chronic kidney disease, stage 3 unspecified: Secondary | ICD-10-CM | POA: Diagnosis not present

## 2019-09-14 DIAGNOSIS — B9729 Other coronavirus as the cause of diseases classified elsewhere: Secondary | ICD-10-CM | POA: Diagnosis not present

## 2019-09-14 DIAGNOSIS — D649 Anemia, unspecified: Secondary | ICD-10-CM | POA: Diagnosis not present

## 2019-09-14 DIAGNOSIS — I10 Essential (primary) hypertension: Secondary | ICD-10-CM | POA: Diagnosis not present

## 2019-09-14 DIAGNOSIS — I110 Hypertensive heart disease with heart failure: Secondary | ICD-10-CM | POA: Diagnosis not present

## 2019-09-14 DIAGNOSIS — J1282 Pneumonia due to coronavirus disease 2019: Secondary | ICD-10-CM | POA: Diagnosis not present

## 2019-09-14 DIAGNOSIS — S2231XD Fracture of one rib, right side, subsequent encounter for fracture with routine healing: Secondary | ICD-10-CM | POA: Diagnosis not present

## 2019-09-14 DIAGNOSIS — I119 Hypertensive heart disease without heart failure: Secondary | ICD-10-CM | POA: Diagnosis not present

## 2019-09-15 ENCOUNTER — Inpatient Hospital Stay (HOSPITAL_COMMUNITY)
Admission: AD | Admit: 2019-09-15 | Discharge: 2019-09-19 | DRG: 177 | Disposition: A | Discharge status: Skilled Nursing Facility | Payer: MEDICARE | Source: Other Acute Inpatient Hospital | Attending: Internal Medicine | Admitting: Internal Medicine

## 2019-09-15 DIAGNOSIS — Z888 Allergy status to other drugs, medicaments and biological substances status: Secondary | ICD-10-CM | POA: Diagnosis not present

## 2019-09-15 DIAGNOSIS — G309 Alzheimer's disease, unspecified: Secondary | ICD-10-CM | POA: Diagnosis present

## 2019-09-15 DIAGNOSIS — I129 Hypertensive chronic kidney disease with stage 1 through stage 4 chronic kidney disease, or unspecified chronic kidney disease: Secondary | ICD-10-CM | POA: Diagnosis present

## 2019-09-15 DIAGNOSIS — N183 Chronic kidney disease, stage 3 unspecified: Secondary | ICD-10-CM | POA: Diagnosis present

## 2019-09-15 DIAGNOSIS — E1165 Type 2 diabetes mellitus with hyperglycemia: Secondary | ICD-10-CM | POA: Diagnosis not present

## 2019-09-15 DIAGNOSIS — S065XAA Traumatic subdural hemorrhage with loss of consciousness status unknown, initial encounter: Secondary | ICD-10-CM | POA: Diagnosis present

## 2019-09-15 DIAGNOSIS — I10 Essential (primary) hypertension: Secondary | ICD-10-CM | POA: Diagnosis present

## 2019-09-15 DIAGNOSIS — E038 Other specified hypothyroidism: Secondary | ICD-10-CM | POA: Diagnosis not present

## 2019-09-15 DIAGNOSIS — J9601 Acute respiratory failure with hypoxia: Secondary | ICD-10-CM | POA: Diagnosis present

## 2019-09-15 DIAGNOSIS — Z885 Allergy status to narcotic agent status: Secondary | ICD-10-CM

## 2019-09-15 DIAGNOSIS — Z79890 Hormone replacement therapy (postmenopausal): Secondary | ICD-10-CM

## 2019-09-15 DIAGNOSIS — E785 Hyperlipidemia, unspecified: Secondary | ICD-10-CM | POA: Diagnosis present

## 2019-09-15 DIAGNOSIS — F0280 Dementia in other diseases classified elsewhere without behavioral disturbance: Secondary | ICD-10-CM | POA: Diagnosis present

## 2019-09-15 DIAGNOSIS — E119 Type 2 diabetes mellitus without complications: Secondary | ICD-10-CM

## 2019-09-15 DIAGNOSIS — T380X5A Adverse effect of glucocorticoids and synthetic analogues, initial encounter: Secondary | ICD-10-CM | POA: Diagnosis present

## 2019-09-15 DIAGNOSIS — R0902 Hypoxemia: Secondary | ICD-10-CM | POA: Diagnosis not present

## 2019-09-15 DIAGNOSIS — M2550 Pain in unspecified joint: Secondary | ICD-10-CM | POA: Diagnosis not present

## 2019-09-15 DIAGNOSIS — E860 Dehydration: Secondary | ICD-10-CM | POA: Diagnosis present

## 2019-09-15 DIAGNOSIS — Z66 Do not resuscitate: Secondary | ICD-10-CM | POA: Diagnosis present

## 2019-09-15 DIAGNOSIS — Z794 Long term (current) use of insulin: Secondary | ICD-10-CM

## 2019-09-15 DIAGNOSIS — U071 COVID-19: Principal | ICD-10-CM | POA: Diagnosis present

## 2019-09-15 DIAGNOSIS — R0602 Shortness of breath: Secondary | ICD-10-CM | POA: Diagnosis not present

## 2019-09-15 DIAGNOSIS — E1122 Type 2 diabetes mellitus with diabetic chronic kidney disease: Secondary | ICD-10-CM | POA: Diagnosis present

## 2019-09-15 DIAGNOSIS — Z7401 Bed confinement status: Secondary | ICD-10-CM | POA: Diagnosis not present

## 2019-09-15 DIAGNOSIS — M810 Age-related osteoporosis without current pathological fracture: Secondary | ICD-10-CM | POA: Diagnosis present

## 2019-09-15 DIAGNOSIS — E039 Hypothyroidism, unspecified: Secondary | ICD-10-CM | POA: Diagnosis present

## 2019-09-15 DIAGNOSIS — E870 Hyperosmolality and hypernatremia: Secondary | ICD-10-CM | POA: Diagnosis present

## 2019-09-15 DIAGNOSIS — F0390 Unspecified dementia without behavioral disturbance: Secondary | ICD-10-CM | POA: Diagnosis not present

## 2019-09-15 DIAGNOSIS — J1282 Pneumonia due to coronavirus disease 2019: Secondary | ICD-10-CM | POA: Diagnosis present

## 2019-09-15 DIAGNOSIS — N179 Acute kidney failure, unspecified: Secondary | ICD-10-CM | POA: Diagnosis present

## 2019-09-15 DIAGNOSIS — S065X9A Traumatic subdural hemorrhage with loss of consciousness of unspecified duration, initial encounter: Secondary | ICD-10-CM | POA: Diagnosis present

## 2019-09-15 DIAGNOSIS — Z79899 Other long term (current) drug therapy: Secondary | ICD-10-CM | POA: Diagnosis not present

## 2019-09-15 DIAGNOSIS — Z7983 Long term (current) use of bisphosphonates: Secondary | ICD-10-CM | POA: Diagnosis not present

## 2019-09-15 DIAGNOSIS — G300 Alzheimer's disease with early onset: Secondary | ICD-10-CM | POA: Diagnosis not present

## 2019-09-15 DIAGNOSIS — J189 Pneumonia, unspecified organism: Secondary | ICD-10-CM | POA: Diagnosis not present

## 2019-09-15 DIAGNOSIS — R410 Disorientation, unspecified: Secondary | ICD-10-CM | POA: Diagnosis not present

## 2019-09-15 DIAGNOSIS — R404 Transient alteration of awareness: Secondary | ICD-10-CM | POA: Diagnosis not present

## 2019-09-15 LAB — CREATININE, SERUM
Creatinine, Ser: 1.73 mg/dL — ABNORMAL HIGH (ref 0.44–1.00)
GFR calc Af Amer: 30 mL/min — ABNORMAL LOW (ref >60)
GFR calc non Af Amer: 26 mL/min — ABNORMAL LOW (ref >60)

## 2019-09-15 LAB — CBC
HCT: 41.4 % (ref 36.0–46.0)
Hemoglobin: 13.2 g/dL (ref 12.0–15.0)
MCH: 29.5 pg (ref 26.0–34.0)
MCHC: 31.9 g/dL (ref 30.0–36.0)
MCV: 92.4 fL (ref 80.0–100.0)
Platelets: 199 10*3/uL (ref 150–400)
RBC: 4.48 MIL/uL (ref 3.87–5.11)
RDW: 13.7 % (ref 11.5–15.5)
WBC: 10.3 10*3/uL (ref 4.0–10.5)
nRBC: 0 % (ref 0.0–0.2)

## 2019-09-15 LAB — LACTATE DEHYDROGENASE: LDH: 268 U/L — ABNORMAL HIGH (ref 98–192)

## 2019-09-15 LAB — GLUCOSE, CAPILLARY
Glucose-Capillary: 298 mg/dL — ABNORMAL HIGH (ref 70–99)
Glucose-Capillary: 327 mg/dL — ABNORMAL HIGH (ref 70–99)

## 2019-09-15 LAB — HEMOGLOBIN A1C
Hgb A1c MFr Bld: 6.5 % — ABNORMAL HIGH (ref 4.8–5.6)
Mean Plasma Glucose: 139.85 mg/dL

## 2019-09-15 LAB — TROPONIN I (HIGH SENSITIVITY)
Troponin I (High Sensitivity): 17 ng/L (ref <18)
Troponin I (High Sensitivity): 17 ng/L (ref <18)

## 2019-09-15 LAB — FERRITIN: Ferritin: 303 ng/mL (ref 11–307)

## 2019-09-15 LAB — PROCALCITONIN: Procalcitonin: 0.4 ng/mL

## 2019-09-15 MED ORDER — ENOXAPARIN SODIUM 30 MG/0.3ML ~~LOC~~ SOLN
30.0000 mg | SUBCUTANEOUS | Status: DC
  Administered 2019-09-16 – 2019-09-18 (×3): 30 mg via SUBCUTANEOUS
  Filled 2019-09-15 (×3): qty 0.3

## 2019-09-15 MED ORDER — AMLODIPINE BESYLATE 10 MG PO TABS
10.0000 mg | ORAL_TABLET | Freq: Every day | ORAL | Status: DC
  Administered 2019-09-16 – 2019-09-19 (×4): 10 mg via ORAL
  Filled 2019-09-15 (×4): qty 1

## 2019-09-15 MED ORDER — ONDANSETRON HCL 4 MG PO TABS: 4.0000 mg | ORAL_TABLET | Freq: Four times a day (QID) | ORAL | Status: DC | PRN

## 2019-09-15 MED ORDER — FAMOTIDINE IN NACL 20-0.9 MG/50ML-% IV SOLN
20.0000 mg | Freq: Two times a day (BID) | INTRAVENOUS | Status: DC
  Administered 2019-09-15: 20 mg via INTRAVENOUS
  Filled 2019-09-15: qty 50

## 2019-09-15 MED ORDER — ONDANSETRON HCL 4 MG/2ML IJ SOLN: 4.0000 mg | Freq: Four times a day (QID) | INTRAMUSCULAR | Status: DC | PRN

## 2019-09-15 MED ORDER — ENOXAPARIN SODIUM 40 MG/0.4ML ~~LOC~~ SOLN
40.0000 mg | SUBCUTANEOUS | Status: DC
  Administered 2019-09-15: 40 mg via SUBCUTANEOUS
  Filled 2019-09-15: qty 0.4

## 2019-09-15 MED ORDER — POLYETHYLENE GLYCOL 3350 17 G PO PACK: 17.0000 g | PACK | Freq: Every day | ORAL | Status: DC | PRN

## 2019-09-15 MED ORDER — INSULIN ASPART 100 UNIT/ML ~~LOC~~ SOLN
0.0000 [IU] | Freq: Three times a day (TID) | SUBCUTANEOUS | Status: DC
  Administered 2019-09-16: 5 [IU] via SUBCUTANEOUS
  Administered 2019-09-16: 2 [IU] via SUBCUTANEOUS
  Administered 2019-09-16: 8 [IU] via SUBCUTANEOUS
  Administered 2019-09-17: 2 [IU] via SUBCUTANEOUS
  Administered 2019-09-17: 5 [IU] via SUBCUTANEOUS
  Administered 2019-09-18 – 2019-09-19 (×3): 3 [IU] via SUBCUTANEOUS

## 2019-09-15 MED ORDER — MEMANTINE HCL 10 MG PO TABS
10.0000 mg | ORAL_TABLET | Freq: Two times a day (BID) | ORAL | Status: DC
  Administered 2019-09-15 – 2019-09-19 (×8): 10 mg via ORAL
  Filled 2019-09-15 (×8): qty 1

## 2019-09-15 MED ORDER — LEVETIRACETAM 500 MG PO TABS: 500.0000 mg | ORAL_TABLET | Freq: Two times a day (BID) | ORAL | Status: DC

## 2019-09-15 MED ORDER — LEVOTHYROXINE SODIUM 25 MCG PO TABS
50.0000 ug | ORAL_TABLET | Freq: Every day | ORAL | Status: DC
  Administered 2019-09-16 – 2019-09-19 (×4): 50 ug via ORAL
  Filled 2019-09-15 (×4): qty 2

## 2019-09-15 MED ORDER — SIMVASTATIN 20 MG PO TABS
20.0000 mg | ORAL_TABLET | Freq: Every day | ORAL | Status: DC
  Administered 2019-09-16 – 2019-09-19 (×4): 20 mg via ORAL
  Filled 2019-09-15 (×4): qty 1

## 2019-09-15 MED ORDER — SODIUM CHLORIDE 0.9 % IV SOLN
100.0000 mg | Freq: Every day | INTRAVENOUS | Status: AC
  Administered 2019-09-16 – 2019-09-19 (×4): 100 mg via INTRAVENOUS
  Filled 2019-09-15 (×4): qty 20

## 2019-09-15 MED ORDER — INSULIN ASPART 100 UNIT/ML ~~LOC~~ SOLN
0.0000 [IU] | Freq: Every day | SUBCUTANEOUS | Status: DC
  Administered 2019-09-15: 4 [IU] via SUBCUTANEOUS

## 2019-09-15 MED ORDER — ACETAMINOPHEN 325 MG PO TABS
650.0000 mg | ORAL_TABLET | Freq: Four times a day (QID) | ORAL | Status: DC | PRN
  Administered 2019-09-16 – 2019-09-18 (×2): 650 mg via ORAL
  Filled 2019-09-15 (×2): qty 2

## 2019-09-15 MED ORDER — SODIUM CHLORIDE 0.9 % IV SOLN: 100.0000 mg | INTRAVENOUS | Status: DC

## 2019-09-15 MED ORDER — ESCITALOPRAM OXALATE 20 MG PO TABS: 20.0000 mg | ORAL_TABLET | Freq: Every day | ORAL | Status: DC

## 2019-09-15 MED ORDER — SODIUM CHLORIDE 0.9 % IV SOLN: 100.0000 mg | Freq: Every day | INTRAVENOUS | Status: DC

## 2019-09-15 MED ORDER — ALBUTEROL SULFATE HFA 108 (90 BASE) MCG/ACT IN AERS
2.0000 | INHALATION_SPRAY | Freq: Four times a day (QID) | RESPIRATORY_TRACT | Status: DC
  Administered 2019-09-15 – 2019-09-17 (×9): 2 via RESPIRATORY_TRACT
  Filled 2019-09-15: qty 6.7

## 2019-09-15 MED ORDER — FAMOTIDINE IN NACL 20-0.9 MG/50ML-% IV SOLN
20.0000 mg | Freq: Every day | INTRAVENOUS | Status: DC
  Administered 2019-09-16 – 2019-09-18 (×3): 20 mg via INTRAVENOUS
  Filled 2019-09-15 (×3): qty 50

## 2019-09-15 MED ORDER — TOCILIZUMAB 400 MG/20ML IV SOLN
8.0000 mg/kg | Freq: Once | INTRAVENOUS | Status: AC
  Administered 2019-09-15: 504 mg via INTRAVENOUS
  Filled 2019-09-15: qty 20

## 2019-09-15 MED ORDER — DIVALPROEX SODIUM 250 MG PO DR TAB
500.0000 mg | DELAYED_RELEASE_TABLET | Freq: Every day | ORAL | Status: DC
  Administered 2019-09-15 – 2019-09-18 (×4): 500 mg via ORAL
  Filled 2019-09-15 (×4): qty 2

## 2019-09-15 MED ORDER — SODIUM CHLORIDE 0.9 % IV SOLN: 200.0000 mg | Freq: Once | INTRAVENOUS | Status: DC

## 2019-09-15 MED ORDER — ATORVASTATIN CALCIUM 10 MG PO TABS: 20.0000 mg | ORAL_TABLET | Freq: Every day | ORAL | Status: DC

## 2019-09-15 MED ORDER — DEXAMETHASONE SODIUM PHOSPHATE 10 MG/ML IJ SOLN
6.0000 mg | INTRAMUSCULAR | Status: DC
  Administered 2019-09-16 – 2019-09-19 (×4): 6 mg via INTRAVENOUS
  Filled 2019-09-15 (×4): qty 1

## 2019-09-15 MED ORDER — DONEPEZIL HCL 10 MG PO TABS
10.0000 mg | ORAL_TABLET | Freq: Every day | ORAL | Status: DC
  Administered 2019-09-16 – 2019-09-18 (×3): 10 mg via ORAL
  Filled 2019-09-15 (×3): qty 1

## 2019-09-15 NOTE — H&P (Addendum)
History and Physical    Bianca Wilson [id number] DOB: 1931/05/16 DOA: 09/15/2019  PCP: Bonnita Nasuti, MD  Patient coming from: [place] [facility]  I have personally briefly reviewed patient's old medical records in [place]  Chief Complaint: Hypoxia and altered mental status  HPI: Bianca Wilson is a 86 y.o. female with medical history significant of, CKD stage III ,Alzheimer dementia, hypertension, hyperlipidemia, type 2 diabetes mellitus, hypothyroidism and recent diagnosis of COVID-19 positive test twice, initially on 09/06/2019 and then 09-13-2019 [facility]  Patient herself have dementia and is unable to provide history.  History was obtained from the notes from [facility] and directly speaking to the ED physician Dr. Colin Rhein at [facility] ED.  Reportedly, patient was found to be hypoxic at Covid being and she was on 3 L of nasal cannula oxygen.  She desaturated to 70% with supplemental oxygen and was found to be increasingly confused, noncoherent and has not had anything to eat or drink since Monday.  Upon arrival to ED at [facility], she was started on nonrebreather.  Chest x-ray showed bilateral patchy airspace disease.  Her Covid positive status was confirmed with the records.  Apparently, ED physician over there tried to talk to the patient's daughter apparently, ED physician tried to talk to patient's daughter Olin Hauser about the course of action they needed and to verify the DNR status that was brought in with the patient by EMS.  Reportedly, family got upset and requested transfer of the patient to [facility] instead.  Patient was given a dose of remdesivir as well as IV Decadron 6 mg at [facility] and was transferred here.  She also had CT of the head over there due to confusion which was unremarkable.  Elevated D-dimer of 1888.  Creatinine 1.4.  Looking at the records available at  [facility], this is her baseline creatinine.  LDH 935.  Ferritin 251.  AST and ALT within normal range.  Sodium 148.  Potassium within normal range.  UA unremarkable.  Normal white cells.  Patient currently here is on nonrebreather but she is alert and oriented to person only.  She denied any shortness of breath and looks very comfortable.  I doubt she requires nonrebreather at this point in time.   Review of Systems: As per HPI otherwise negative.    Past Medical History:  Diagnosis Date  . Alzheimer disease ([place])   . Chronic kidney disease    stage 3  . Diabetes mellitus without complication ([place])    type 2  . Dysphagia   . Hypertension   . Osteoporosis     Past Surgical History:  Procedure Laterality Date  . BURR HOLE Right 08/04/2018   Procedure: Right [facility] for evacuation of subdural hematoma;  Surgeon: Judith Part, MD;  Location: [place];  Service: Neurosurgery;  Laterality: Right;     reports that she has never smoked. She has never used smokeless tobacco. She reports that she does not drink alcohol or use drugs.  Allergies  Allergen Reactions  . Benadryl [Diphenhydramine]     Per MAR  . Hydrocodone     Per [facility] 08/04/18 Family reports sensitive to narcotics (confusion/delirium)  . Other Other (See Comments)    Muscles Relaxers ,Narcotics  08/04/18 Family reports sensitive to narcotics (confusion/delirium)    Noncontributory family history  Prior to Admission medications   Medication Sig Start Date  End Date Taking? Authorizing Provider  acetaminophen (TYLENOL) 325 MG tablet Take 2 tablets (650 mg total) by mouth every 4 (four) hours as needed for fever, headache or mild pain. Patient not taking: Reported on 07/30/2018 07/08/18   Mendel Corning, MD  alendronate (FOSAMAX) 70 MG tablet Take 70 mg by mouth every Sunday. Take with a full glass of water on an empty stomach.    [provider]  amLODipine (NORVASC) 10 MG tablet Take 10 mg by mouth  daily.     [provider]  atorvastatin (LIPITOR) 20 MG tablet Take 20 mg by mouth at bedtime.     [provider]  diphenhydrAMINE (BENADRYL) 25 mg capsule Take 1 capsule (25 mg total) by mouth every 6 (six) hours as needed for itching. Patient not taking: Reported on 07/30/2018 07/08/18   Rai, Vernelle Emerald, MD  donepezil (ARICEPT) 10 MG tablet Take 10 mg by mouth at bedtime.    [provider]  escitalopram (LEXAPRO) 20 MG tablet Take 20 mg by mouth daily.    [provider]  insulin aspart (NOVOLOG) 100 UNIT/ML injection Inject 0-9 Units into the skin 3 (three) times daily with meals. Sliding scale CBG 70 - 120: 0 units CBG 121 - 150: 1 unit,  CBG 151 - 200: 2 units,  CBG 201 - 250: 3 units,  CBG 251 - 300: 5 units,  CBG 301 - 350: 7 units,  CBG 351 - 400: 9 units   CBG > 400: 9 units and notify your MD Patient taking differently: Inject 0-9 Units into the skin 3 (three) times daily with meals. Sliding scale CBG 150 - 200: 2 units, CBG 201 - 250: 4 units,  CBG 251 - 300: 6 units,  CBG 301 - 350: 8 units,  CBG 351 - 400: 10 units   CBG > 400 or less than 70: Call provider for orders 07/08/18   Rai, Vernelle Emerald, MD  levETIRAcetam (KEPPRA) 500 MG tablet Take 1 tablet (500 mg total) by mouth 2 (two) times daily. 08/05/18   Judith Part, MD  levothyroxine (SYNTHROID, LEVOTHROID) 25 MCG tablet Take 25 mcg by mouth daily before breakfast.    [provider]  loperamide (IMODIUM A-D) 2 MG tablet Take 2 mg by mouth as needed for diarrhea or loose stools.    [provider]  memantine (NAMENDA) 10 MG tablet Take 10 mg by mouth 2 (two) times daily.    [provider]  metFORMIN (GLUCOPHAGE-XR) 500 MG 24 hr tablet Take 500 mg by mouth daily with breakfast.    [provider]  ondansetron (ZOFRAN) 4 MG tablet Take 4 mg by mouth every 8 (eight) hours as needed for nausea or vomiting.    [provider]    Physical Exam: There  were no vitals filed for this visit.  Constitutional: NAD, calm, comfortable There were no vitals filed for this visit. Eyes: PERRL, lids and conjunctivae normal Neck: normal, supple, no masses, no thyromegaly Respiratory: Diminished breath sounds, no wheezes or crackles.  On nonrebreather Cardiovascular: Regular rate and rhythm, no murmurs / rubs / gallops. No extremity edema. 2+ pedal pulses. No carotid bruits.  Abdomen: no tenderness, no masses palpated. No hepatosplenomegaly. Bowel sounds positive.  Musculoskeletal: no clubbing / cyanosis. No joint deformity upper and lower extremities. Good ROM, no contractures. Normal muscle tone.  Skin: no rashes, lesions, ulcers. No induration Neurologic: CN 2-12 grossly intact. Sensation intact, DTR normal. Strength 5/5 in all  4.   Labs on Admission: I have personally reviewed following labs and imaging studies  CBC: No results for input(s): WBC, NEUTROABS, HGB, HCT, MCV, PLT in the last 168 hours. Basic Metabolic Panel: No results for input(s): NA, K, CL, CO2, GLUCOSE, BUN, CREATININE, CALCIUM, MG, PHOS in the last 168 hours. GFR: CrCl cannot be calculated ([name] most recent lab result is older than the maximum 21 days allowed.). Liver Function Tests: No results for input(s): AST, ALT, ALKPHOS, BILITOT, PROT, ALBUMIN in the last 168 hours. No results for input(s): LIPASE, AMYLASE in the last 168 hours. No results for input(s): AMMONIA in the last 168 hours. Coagulation Profile: No results for input(s): INR, PROTIME in the last 168 hours. Cardiac Enzymes: No results for input(s): CKTOTAL, CKMB, CKMBINDEX, TROPONINI in the last 168 hours. BNP (last 3 results) No results for input(s): PROBNP in the last 8760 hours. HbA1C: No results for input(s): HGBA1C in the last 72 hours. CBG: No results for input(s): GLUCAP in the last 168 hours. Lipid Profile: No results for input(s): CHOL, HDL, LDLCALC, TRIG, CHOLHDL, LDLDIRECT in the last 72  hours. Thyroid Function Tests: No results for input(s): TSH, T4TOTAL, FREET4, T3FREE, THYROIDAB in the last 72 hours. Anemia Panel: No results for input(s): VITAMINB12, FOLATE, FERRITIN, TIBC, IRON, RETICCTPCT in the last 72 hours. Urine analysis:    Component Value Date/Time   COLORURINE YELLOW 07/01/2018 1834   APPEARANCEUR CLEAR 07/01/2018 1834   LABSPEC <1.005 (L) 07/01/2018 1834   PHURINE 5.0 07/01/2018 1834   GLUCOSEU NEGATIVE 07/01/2018 1834   HGBUR NEGATIVE 07/01/2018 1834   BILIRUBINUR NEGATIVE 07/01/2018 1834   KETONESUR NEGATIVE 07/01/2018 1834   PROTEINUR NEGATIVE 07/01/2018 1834   NITRITE NEGATIVE 07/01/2018 1834   LEUKOCYTESUR TRACE (A) 07/01/2018 1834    Radiological Exams on Admission: No results found.   Assessment/Plan Active Problems:   Subdural hematoma (HCC)   HTN (hypertension)   Alzheimer's dementia ([place])   HLD (hyperlipidemia)   Type 2 diabetes mellitus ([place])   Hypothyroidism   Pneumonia due to COVID-19 virus   Acute respiratory failure with hypoxia (HCC)    Acute hypoxic respiratory failure secondary to COVID-19 pneumonia: Patient has received 1 dose of remdesivir as well as 1 dose of Decadron over at [facility] ED.  I will consult pharmacy to initiate both of them starting tomorrow morning.  I also spoke to patient's daughter Olin Hauser who claims to be the healthcare power of attorney and she prefers for the patient to be full code at this point in time means she would like for the patient to be intubated and go through CPR if needed.  I also discussed with her about the use of Actemra in COVID-19 patients although that is still off label.  We went through potential risk and benefits.  Patient has no history of pneumonia.  She is not on any immunosuppressive medications or any prednisone chronically.  Olin Hauser said " do everything is needed to treat my mother".  Will order Actemra for her.  Essential hypertension: Controlled.  Resume home  medications.  Hyperlipidemia: Resume home statins.  Hypothyroidism: Resume Synthroid.  Type 2 diabetes mellitus: Only on Metformin.  We will hold that.  Start on SSI.  Check hemoglobin A1c.  Dementia/ ?  Acute encephalopathy: She is alert and oriented to person.  This is her baseline based on my conversation with patient's daughter.  Her encephalopathy has resolved.  I will resume her dementia medications.  CKD stage III: Extensive review of  [facility] records show that patient's creatinine has been ranging anywhere from 0.8-1.9 going all the way from 2017.  Unknown whether this is acute kidney injury versus chronic kidney disease.  We will repeat her labs tomorrow morning.  Elevated D-dimer: Likely inflammatory marker secondary to COVID-19 pneumonia.  Now that she is looking comfortable, I doubt PE.  Due to her elevated creatinine, unable to do CT angiogram.  Hopefully her renal function will improve tomorrow and we will consider doing CT angiogram of the chest to rule out PE.  DVT prophylaxis: Lovenox Code Status: Full code Family Communication: None present at bedside.  Discussed with daughter, mentioned above.  Olin Hauser, her phone number is [phone] Disposition Plan: To be determined Consults called: None Admission status: Inpatient   Darliss Cheney MD [facility]  09/15/2019, 4:12 PM  To contact the attending provider between 7A-7P or the covering provider during after hours 7P-7A, please log into the web site [email or web address]

## 2019-09-16 DIAGNOSIS — J9601 Acute respiratory failure with hypoxia: Secondary | ICD-10-CM

## 2019-09-16 LAB — CBC WITH DIFFERENTIAL/PLATELET
Abs Immature Granulocytes: 0.05 10*3/uL (ref 0.00–0.07)
Basophils Absolute: 0 10*3/uL (ref 0.0–0.1)
Basophils Relative: 0 %
Eosinophils Absolute: 0 10*3/uL (ref 0.0–0.5)
Eosinophils Relative: 0 %
HCT: 42.3 % (ref 36.0–46.0)
Hemoglobin: 13.5 g/dL (ref 12.0–15.0)
Immature Granulocytes: 1 %
Lymphocytes Relative: 9 %
Lymphs Abs: 0.8 10*3/uL (ref 0.7–4.0)
MCH: 30.2 pg (ref 26.0–34.0)
MCHC: 31.9 g/dL (ref 30.0–36.0)
MCV: 94.6 fL (ref 80.0–100.0)
Monocytes Absolute: 0.3 10*3/uL (ref 0.1–1.0)
Monocytes Relative: 4 %
Neutro Abs: 7.1 10*3/uL (ref 1.7–7.7)
Neutrophils Relative %: 86 %
Platelets: 246 10*3/uL (ref 150–400)
RBC: 4.47 MIL/uL (ref 3.87–5.11)
RDW: 13.8 % (ref 11.5–15.5)
WBC: 8.2 10*3/uL (ref 4.0–10.5)
nRBC: 0 % (ref 0.0–0.2)

## 2019-09-16 LAB — COMPREHENSIVE METABOLIC PANEL
ALT: 16 U/L (ref 0–44)
AST: 28 U/L (ref 15–41)
Albumin: 2.5 g/dL — ABNORMAL LOW (ref 3.5–5.0)
Alkaline Phosphatase: 70 U/L (ref 38–126)
Anion gap: 15 (ref 5–15)
BUN: 57 mg/dL — ABNORMAL HIGH (ref 8–23)
CO2: 27 mmol/L (ref 22–32)
Calcium: 8.6 mg/dL — ABNORMAL LOW (ref 8.9–10.3)
Chloride: 107 mmol/L (ref 98–111)
Creatinine, Ser: 1.45 mg/dL — ABNORMAL HIGH (ref 0.44–1.00)
GFR calc Af Amer: 38 mL/min — ABNORMAL LOW (ref >60)
GFR calc non Af Amer: 33 mL/min — ABNORMAL LOW (ref >60)
Glucose, Bld: 278 mg/dL — ABNORMAL HIGH (ref 70–99)
Potassium: 4.8 mmol/L (ref 3.5–5.1)
Sodium: 149 mmol/L — ABNORMAL HIGH (ref 135–145)
Total Bilirubin: 0.8 mg/dL (ref 0.3–1.2)
Total Protein: 6.3 g/dL — ABNORMAL LOW (ref 6.5–8.1)

## 2019-09-16 LAB — GLUCOSE, CAPILLARY
Glucose-Capillary: 241 mg/dL — ABNORMAL HIGH (ref 70–99)
Glucose-Capillary: 283 mg/dL — ABNORMAL HIGH (ref 70–99)
Glucose-Capillary: 278 mg/dL — ABNORMAL HIGH (ref 70–99)
Glucose-Capillary: 147 mg/dL — ABNORMAL HIGH (ref 70–99)
Glucose-Capillary: 153 mg/dL — ABNORMAL HIGH (ref 70–99)

## 2019-09-16 LAB — C-REACTIVE PROTEIN: CRP: 17.7 mg/dL — ABNORMAL HIGH (ref <1.0)

## 2019-09-16 LAB — D-DIMER, QUANTITATIVE: D-Dimer, Quant: 1.54 ug/mL-FEU — ABNORMAL HIGH (ref 0.00–0.50)

## 2019-09-16 LAB — MAGNESIUM: Magnesium: 2.6 mg/dL — ABNORMAL HIGH (ref 1.7–2.4)

## 2019-09-16 LAB — BRAIN NATRIURETIC PEPTIDE: B Natriuretic Peptide: 38.7 pg/mL (ref 0.0–100.0)

## 2019-09-16 MED ORDER — INSULIN GLARGINE 100 UNIT/ML ~~LOC~~ SOLN: 20.0000 [IU] | Freq: Every day | SUBCUTANEOUS | Status: DC

## 2019-09-16 MED ORDER — ENSURE ENLIVE PO LIQD: 237.0000 mL | Freq: Three times a day (TID) | ORAL | Status: DC

## 2019-09-16 MED ORDER — ENSURE ENLIVE PO LIQD
237.0000 mL | Freq: Three times a day (TID) | ORAL | Status: DC
  Administered 2019-09-16 – 2019-09-19 (×8): 237 mL via ORAL

## 2019-09-16 MED ORDER — INSULIN GLARGINE 100 UNIT/ML ~~LOC~~ SOLN
25.0000 [IU] | Freq: Every day | SUBCUTANEOUS | Status: DC
  Administered 2019-09-16 – 2019-09-19 (×4): 25 [IU] via SUBCUTANEOUS
  Filled 2019-09-16 (×6): qty 0.25

## 2019-09-16 MED ORDER — DEXTROSE 5 % IV SOLN: INTRAVENOUS | Status: DC

## 2019-09-16 MED ORDER — INSULIN ASPART 100 UNIT/ML ~~LOC~~ SOLN
3.0000 [IU] | Freq: Three times a day (TID) | SUBCUTANEOUS | Status: DC
  Administered 2019-09-16 – 2019-09-19 (×8): 3 [IU] via SUBCUTANEOUS

## 2019-09-16 MED ORDER — PRO-STAT SUGAR FREE PO LIQD
30.0000 mL | Freq: Two times a day (BID) | ORAL | Status: DC
  Administered 2019-09-16 – 2019-09-19 (×7): 30 mL via ORAL
  Filled 2019-09-16 (×6): qty 30

## 2019-09-16 NOTE — TOC Initial Note (Addendum)
Transition of Care [facility]) - Initial/Assessment Note    Patient Details  Name: Bianca Wilson MRN: [id number] Date of Birth: [birth year]  [facility] CM/SW Contact:    Maryclare Labrador, RN Phone Number: 09/16/2019, 3:33 PM  Clinical Narrative:        CM contacted by pts daughter.  Daughter informed CM that she recently placed her mom in  [place] facility around mid Feb.  Daughter is interested in exploring taking pt home at discharge.  CM inquired about baseline mobility/functionality to determine what type of care may be needed to take pt home.  Daughter informed CM that pt will need 1:1 care at home.  CM explained that [facility] could potentially be arranged however would not provide 1:1 care - at most once or twice a week for 45 mins with each [facility] modality.  Daughter stated she understood and informed CM that she is in the process of securing private pay help as well.  Daughter requested CM reach out to [facility] to see if they could potentially accept pt for [facility] at discharge.  CM informed daughter of medicare.gov information.  CM contacted agency and spoke with [place] to discuss with agency leadership to see if they can take COVID Positive pt.       Update:  [facility] can not staff pt.  Daughter did not have a second preference.  [facility] can accept pt if discharging home.  Daughter informed that [facility] can accept however she is still contemplating facility or home discharge     Barriers to Discharge: Continued Medical Work up   Patient Goals and CMS Choice     Choice offered to / list presented to : Adult Children  Expected Discharge Plan and Services         Living arrangements for the past 2 months: Assisted Living Facility(Recently placed in [place])                                      Prior Living Arrangements/Services Living arrangements for the past 2 months: Assisted Living Facility(Recently placed in [employer])                    Activities of Daily Living      Permission Sought/Granted                  Emotional Assessment              Admission diagnosis:  Pneumonia due to COVID-19 virus [U07.1, J12.82] Patient Active Problem List   Diagnosis Date Noted  . Pneumonia due to COVID-19 virus 09/15/2019  . Acute respiratory failure with hypoxia ([place]) 09/15/2019  . Goals of care, counseling/discussion   . Palliative care by specialist   . HTN (hypertension) 06/16/2018  . AKI (acute kidney injury) ([place]) 06/16/2018  . Alzheimer's dementia ([place]) 06/16/2018  . At risk for falls 06/16/2018  . Depression 06/16/2018  . HLD (hyperlipidemia) 06/16/2018  . Type 2 diabetes mellitus ([place]) 06/16/2018  . Hypothyroidism 06/16/2018  . Subdural hematoma ([place]) 06/15/2018   PCP:  Bonnita Nasuti, MD Pharmacy:  No Pharmacies Listed    Social Determinants of Health (SDOH) Interventions    Readmission Risk Interventions No flowsheet data found.

## 2019-09-16 NOTE — Progress Notes (Signed)
PROGRESS NOTE                                                                                                                                                                                                             Patient Demographics:    Bianca Wilson, is a 86 y.o. female, DOB - 23-Nov-1930, [id number]  Outpatient Primary MD for the patient is Bonnita Nasuti, MD    LOS - 1  Admit date - 09/15/2019    No chief complaint on file.      Brief Narrative -    Subjective:    Bianca Wilson today in bed appears to be in no distress, pleasantly confused but denies any headache chest or abdominal pain.  She thinks she is 86 years old and continues to deny any shortness of breath.   Assessment  & Plan :     1. Acute Hypoxic Resp. Failure due to Acute Covid 19 Viral Pneumonitis during the ongoing 2020 Covid 19 Pandemic - she appears to have had severe disease upon presentation requiring 15 L nasal cannula oxygen, she has been appropriately treated with IV steroids, remdesivir and Actemra upon arrival.  She has shown good improvement currently down to 3 L nasal cannula oxygen and appears to be improving.  Inflammatory markers will be trended.  Encouraged the patient to sit up in chair in the daytime use I-S and flutter valve for pulmonary toiletry and then prone in bed when at night.   SpO2: (!) 88 % O2 Flow Rate (L/min): 3 L/min  Recent Labs  Lab 09/15/19 1700 09/16/19 0720  CRP  --  17.7*  DDIMER  --  1.54*  FERRITIN 303  --   BNP  --  38.7  PROCALCITON 0.40  --     Hepatic Function Latest Ref Rng & Units 09/16/2019 07/01/2018 06/15/2018  Total Protein 6.5 - 8.1 g/dL 6.3(L) 6.5 6.7  Albumin 3.5 - 5.0 g/dL 2.5(L) 3.6 3.8  AST 15 - 41 U/L '28 18 17  ' ALT 0 - 44 U/L '16 12 11  ' Alk Phosphatase 38 - 126 U/L 70 77 82  Total Bilirubin 0.3 - 1.2 mg/dL 0.8 0.5 0.5    2.  Dehydration with hypernatremia and AKI.  Hydrate  with D5W and monitor.  Baseline creatinine as per our records is under 1 few  months ago.  3.  Hypothyroidism.  On home dose Synthroid.  4.  Dyslipidemia.  Continue home dose statin.  5.  Advanced underlying Alzheimer's dementia, at risk for delirium, supportive care.  Minimize benzodiazepines and narcotics.  6.  Essential hypertension.  Continue Norvasc.  7. DM2 - on SSI, due to steroids we will add some Lantus.  Monitor.  Lab Results  Component Value Date   HGBA1C 6.5 (H) 09/15/2019   CBG (last 3)  Recent Labs    09/15/19 1704 09/15/19 2057 09/16/19 0815  GLUCAP 298* 327* 241*     Condition -  Guarded  Family Communication  :  Daughter 09/16/19  Code Status :  DNR  Diet :   Diet Order            DIET SOFT Room service appropriate? Yes; Fluid consistency: Nectar Thick  Diet effective now               Disposition Plan  : In the hospital, getting treatment for severe COVID-19 infection.  Will finish her treatment course.  Consults  :  None  Procedures  :  None  PUD Prophylaxis : None  DVT Prophylaxis  :  Lovenox   Lab Results  Component Value Date   PLT 246 09/16/2019    Inpatient Medications  Scheduled Meds: . albuterol  2 puff Inhalation Q6H  . amLODipine  10 mg Oral Daily  . dexamethasone (DECADRON) injection  6 mg Intravenous Q24H  . divalproex  500 mg Oral QHS  . donepezil  10 mg Oral QHS  . enoxaparin (LOVENOX) injection  30 mg Subcutaneous Q24H  . insulin aspart  0-15 Units Subcutaneous TID WC  . insulin aspart  0-5 Units Subcutaneous QHS  . levothyroxine  50 mcg Oral Q0600  . memantine  10 mg Oral BID  . simvastatin  20 mg Oral Daily   Continuous Infusions: . dextrose    . famotidine (PEPCID) IV    . remdesivir 100 mg in NS 100 mL 100 mg (09/16/19 0842)   PRN Meds:.acetaminophen, ondansetron **OR** ondansetron (ZOFRAN) IV, polyethylene glycol  Antibiotics  :    Anti-infectives (From admission, onward)   Start     Dose/Rate Route  Frequency Ordered Stop   09/17/19 1000  remdesivir 100 mg in sodium chloride 0.9 % 100 mL IVPB  Status:  Discontinued     100 mg 200 mL/hr over 30 Minutes Intravenous Daily 09/15/19 1609 09/15/19 1611   09/16/19 1000  remdesivir 100 mg in sodium chloride 0.9 % 100 mL IVPB  Status:  Discontinued     100 mg 200 mL/hr over 30 Minutes Intravenous Daily 09/15/19 1612 09/15/19 1653   09/16/19 1000  remdesivir 100 mg in sodium chloride 0.9 % 100 mL IVPB     100 mg 200 mL/hr over 30 Minutes Intravenous Daily 09/15/19 1653 09/20/19 0959   09/16/19 0900  remdesivir 200 mg in sodium chloride 0.9% 250 mL IVPB  Status:  Discontinued     200 mg 580 mL/hr over 30 Minutes Intravenous Once 09/15/19 1609 09/15/19 1611   09/15/19 1700  remdesivir 100 mg in sodium chloride 0.9 % 100 mL IVPB  Status:  Discontinued     100 mg 200 mL/hr over 30 Minutes Intravenous Every 1 hr x 2 09/15/19 1612 09/15/19 1653       Time Spent in minutes  30   Lala Lund M.D on 09/16/2019 at 10:53 AM  To page go to [email or web address] - password  [place]  [phone]     See all Orders from today for further details    Objective:   Vitals:   09/16/19 0739 09/16/19 0742 09/16/19 0814 09/16/19 0835  BP:   (!) 100/51 111/65  Pulse: 67 68 71 73  Resp: (!) '21 15  16  ' Temp:   97.7 F (36.5 C)   TempSrc:   Oral   SpO2: 96% 93%  (!) 88%  Weight:      Height:        Wt Readings from Last 3 Encounters:  09/16/19 66.4 kg  08/04/18 69.2 kg  07/01/18 73 kg     Intake/Output Summary (Last 24 hours) at 09/16/2019 1053 Last data filed at 09/16/2019 0552 Gross per 24 hour  Intake 310 ml  Output --  Net 310 ml     Physical Exam  Awake stays confused, moving all 4 extremities by herself [place].AT,PERRAL Supple Neck,No JVD, No cervical lymphadenopathy appriciated.  Symmetrical Chest wall movement, Good air movement bilaterally, CTAB RRR,No Gallops,Rubs or new Murmurs, No Parasternal Heave +ve  B.Sounds, Abd Soft, No tenderness, No organomegaly appriciated, No rebound - guarding or rigidity. No Cyanosis, Clubbing or edema, No new Rash or bruise     Data Review:    CBC Recent Labs  Lab 09/15/19 1700 09/16/19 0720  WBC 10.3 8.2  HGB 13.2 13.5  HCT 41.4 42.3  PLT 199 246  MCV 92.4 94.6  MCH 29.5 30.2  MCHC 31.9 31.9  RDW 13.7 13.8  LYMPHSABS  --  0.8  MONOABS  --  0.3  EOSABS  --  0.0  BASOSABS  --  0.0    Chemistries  Recent Labs  Lab 09/15/19 1700 09/16/19 0720  NA  --  149*  K  --  4.8  CL  --  107  CO2  --  27  GLUCOSE  --  278*  BUN  --  57*  CREATININE 1.73* 1.45*  CALCIUM  --  8.6*  MG  --  2.6*  AST  --  28  ALT  --  16  ALKPHOS  --  70  BILITOT  --  0.8   ------------------------------------------------------------------------------------------------------------------ No results for input(s): CHOL, HDL, LDLCALC, TRIG, CHOLHDL, LDLDIRECT in the last 72 hours.  Lab Results  Component Value Date   HGBA1C 6.5 (H) 09/15/2019   ------------------------------------------------------------------------------------------------------------------ No results for input(s): TSH, T4TOTAL, T3FREE, THYROIDAB in the last 72 hours.  Invalid input(s): FREET3  Cardiac Enzymes No results for input(s): CKMB, TROPONINI, MYOGLOBIN in the last 168 hours.  Invalid input(s): CK ------------------------------------------------------------------------------------------------------------------    Component Value Date/Time   BNP 38.7 09/16/2019 0720    Micro Results No results found for this or any previous visit (from the past 240 hour(s)).  Radiology Reports No results found.

## 2019-09-16 NOTE — Progress Notes (Addendum)
Initial Nutrition Assessment   RD working remotely.  DOCUMENTATION CODES:   Not applicable  INTERVENTION:  Provide Ensure Enlive po TID (thickened to appropriate consistency), each supplement provides 350 kcal and 20 grams of protein  Provide 30 ml Prostat po BID, each supplement provides 100 kcal and 15 grams of protein.   Encourage adequate PO intake.   NUTRITION DIAGNOSIS:   Inadequate oral intake related to poor appetite as evidenced by meal completion < 50%.  GOAL:   Patient will meet greater than or equal to 90% of their needs  MONITOR:   PO intake, Supplement acceptance, Skin, Weight trends, Labs, I & O's  REASON FOR ASSESSMENT:   Consult Assessment of nutrition requirement/status  ASSESSMENT:   86 y.o. female with medical history significant of, CKD stage III ,Alzheimer dementia, hypertension, hyperlipidemia, type 2 diabetes mellitus, hypothyroidism and recent diagnosis of COVID-19 positive test twice, initially on 09/06/2019 and then 09-13-2019 [facility]. Acute hypoxic respiratory failure secondary to COVID-19 pneumonia. Transferred to [facility].  Per MD note, pt with no po since Monday. Per RN, pt has been consuming fluids today and able to eat cake at lunch. RN reports pt will eat when max encouragement given. RD to order nutritional supplements to aid in caloric and protein needs. Unable to complete Nutrition-Focused physical exam at this time.   Labs and medications reviewed.   Diet Order:   Diet Order            DIET SOFT Room service appropriate? Yes; Fluid consistency: Nectar Thick  Diet effective now              EDUCATION NEEDS:   Not appropriate for education at this time  Skin:  Skin Assessment: Reviewed RN Assessment  Last BM:  Unknown  Height:   Ht Readings from Last 1 Encounters:  09/15/19 5\' 2"  (1.575 m)    Weight:   Wt Readings from Last 1 Encounters:  09/16/19 66.4 kg     BMI:  Body mass index is 26.77 kg/m.  Estimated Nutritional Needs:   Kcal:  1700-1850  Protein:  75-90 grams  Fluid:  >/= 1.7 L/day    Corrin Parker, MS, RD, LDN RD pager number/after hours weekend pager number on Amion.

## 2019-09-16 NOTE — Progress Notes (Signed)
   09/16/19 1713  Family/Significant Other Communication  Family/Significant Other Update Called;Updated   updated daughter patricia on pt status and Plan of Care

## 2019-09-16 NOTE — Progress Notes (Signed)
Physical Therapy Evaluation  Patient presents with impaired sitting and standing balance. She requires modA x 2 people for transfer bed>chair with use of Stedy. She is confused during session but agreeable to mobility. Patient self removes [place] and was on RA at start of session due to this. Patient placed on 2L [place] for second half of session. Appeared to desat on room air to 81% sitting EOB. Oxygen saturation 92% at end of session on 2L [place] with patient in recliner chair. Patient unable to state her PLOF or home set-up. Per chart review, patient is from ALF. Recommend continued skilled PT services and discharge to SNF for short term rehabilitation if ALF cannot accommodate patient at this level of mobility.     09/16/19 0952  PT Visit Information  Last PT Received On 09/16/19  Assistance Needed +2  PT/OT/SLP Co-Evaluation/Treatment Yes  Reason for Co-Treatment Complexity of the patient's impairments (multi-system involvement);Necessary to address cognition/behavior during functional activity;For patient/therapist safety  PT goals addressed during session Mobility/safety with mobility;Balance  History of Present Illness 86 year old female admitted 09/15/19 with recent diagnosis of COVID 19 on 09/06/19 and 09/13/19. She was initially brought from her ALF to [facility] via EMS due to hypoxia and on 3L . She desaturated to 70% with supplemental oxygen and was found to be increasingly confused, noncoherent, and has not had anything to eat or drink since Monday. She was started on nonrebreather upon arrival to [facility] ED. CXR showed bilat patchy airspace disease. Patient transferred to [facility].  Precautions  Precautions Fall  Restrictions  Weight Bearing Restrictions No  Home Living  Family/patient expects to be discharged to: Assisted living  Additional Comments Patient unable to provide information on PLOF. Per PT evaluation Jan 2020, patient was ambulatory in early Dec 2019.  Prior  Function  Comments Need to confirm with patient's ALF patient's PLOF.  Communication  Communication Expressive difficulties  Pain Assessment  Pain Assessment Faces  Faces Pain Scale 0  Cognition  Arousal/Alertness Awake/alert  Behavior During Therapy Restless  Overall Cognitive Status History of cognitive impairments - at baseline (history Alzheimer's dementia per chart review)  General Comments Patient talkative during session but her comments are not always appropriate to current situation. She is confused.  Upper Extremity Assessment  Upper Extremity Assessment Defer to OT evaluation  Lower Extremity Assessment  Lower Extremity Assessment Generalized weakness;Difficult to assess due to impaired cognition  Cervical / Trunk Assessment  Cervical / Trunk Assessment Kyphotic  Bed Mobility  Overal bed mobility Needs Assistance  Bed Mobility Supine to Sit  Supine to sit Max assist;+2 for physical assistance;HOB elevated  General bed mobility comments hand over hand assist for patient to attempt to use bedrail to assist   Transfers  Overall transfer level Needs assistance  Transfer via Lift Equipment Stedy  Transfers Sit to/from Stand  Sit to Stand Mod assist;+2 physical assistance;From elevated surface  General transfer comment sit>stand from EOB with Stedy and bed height elevated for transfer to recliner chair  Ambulation/Gait  General Gait Details patient too unsteady to attempt ambulation  Balance  Overall balance assessment Needs assistance  Sitting-balance support Bilateral upper extremity supported;Single extremity supported;Feet supported  Sitting balance-Leahy Scale Poor (progressing to fair)  Sitting balance - Comments initially requiring mod/maxA to sit EOB, left lean but improved to close supervision sitting balance at EOB for approx one minute  Postural control Left lateral lean  Standing balance support Bilateral upper extremity supported  Standing balance-Leahy Scale  Zero  Standing balance comment modA with Stedy for standing with Stedy, left lean noted  General Comments  General comments (skin integrity, edema, etc.) BP at start of session supine in bed: 107/67 at rest, BP sitting EOB: 118/84, unable to get oxygen saturation during bed mobility and transfer, 2L [place] placed on patient once sitting EOB as nurse noted patient desated to 80s on room air earlier, oxygen down to 81% once reading achieved, 92% at end of session on 2L [place], HR 82 bpm at rest  PT - End of Session  Equipment Utilized During Treatment Gait belt;Oxygen  Activity Tolerance Patient limited by fatigue  Patient left in chair;with chair alarm set  Nurse Communication Mobility status;Need for lift equipment  PT Assessment  PT Recommendation/Assessment Patient needs continued PT services  PT Visit Diagnosis Unsteadiness on feet (R26.81);Muscle weakness (generalized) (M62.81)  PT Problem List Decreased strength;Decreased activity tolerance;Decreased balance;Decreased mobility;Decreased cognition;Cardiopulmonary status limiting activity;Decreased safety awareness  Barriers to Discharge Comments Unsure how much support/physical assist ALF can provide patient  PT Plan  PT Frequency (ACUTE ONLY) Min 2X/week  PT Treatment/Interventions (ACUTE ONLY) DME instruction;Gait training;Functional mobility training;Therapeutic activities;Balance training;Therapeutic exercise;Cognitive remediation;Patient/family education  AM-PAC PT "6 Clicks" Mobility Outcome Measure (Version 2)  Help needed turning from your back to your side while in a flat bed without using bedrails? 2  Help needed moving from lying on your back to sitting on the side of a flat bed without using bedrails? 2  Help needed moving to and from a bed to a chair (including a wheelchair)? 2  Help needed standing up from a chair using your arms (e.g., wheelchair or bedside chair)? 2  Help needed to walk in hospital room? 1  Help needed climbing 3-5  steps with a railing?  1  6 Click Score 10  Consider Recommendation of Discharge To: CIR/SNF/LTACH  PT Recommendation  Follow Up Recommendations SNF; 24/7 Assistance  PT equipment  (TBD as mobility progresses)  Individuals Consulted  Consulted and Agree with Results and Recommendations Patient unable/family or caregiver not available  Acute Rehab PT Goals  Patient Stated Goal to get out of here  PT Goal Formulation Patient unable to participate in goal setting  Time For Goal Achievement 09/29/19  Potential to Achieve Goals Fair  PT Time Calculation  PT Start Time (ACUTE ONLY) [id number]  PT Stop Time (ACUTE ONLY) 1034  PT Time Calculation (min) (ACUTE ONLY) 42 min  PT General Charges  $$ ACUTE PT VISIT 1 Visit  PT Evaluation  $PT Eval Moderate Complexity 1 Mod   Birdie Hopes, DPT, PT Acute Rehab [phone] office

## 2019-09-16 NOTE — Progress Notes (Addendum)
Occupational Therapy Evaluation Patient Details Name: Bianca Wilson MRN: [id number] DOB: Oct 11, 1930 Today's Date: 09/16/2019    History of Present Illness Bianca Wilson is a 86 y.o. female with medical history significant of, CKD stage III ,Alzheimer dementia, hypertension, hyperlipidemia, type 2 diabetes mellitus, hypothyroidism and recent diagnosis of COVID-19 positive test twice, initially on 09/06/2019 and then 09-13-2019 [facility]. Reportedly, patient was found to be hypoxic at Covid being and she was on 3 L of nasal cannula oxygen.  She desaturated to 70% with supplemental oxygen and was found to be increasingly confused, noncoherent and has not had anything to eat or drink since Monday.  Upon arrival to ED at [facility], she was started on nonrebreather.  Chest x-ray showed bilateral patchy airspace disease.     Clinical Impression   PTA, pt residing at ALF and has recently required increased assistance with ADLs and mobility per staff. Presently, pt received on RA (at 81%), as pt independently removes [place]. Pt Max A for bed mobility to sit EOB with Max A initially to maintain sitting balance improving to min guard at times, but requires constant cues for correcting balance. Pt Mod A + 2 for sit to stand in [place] and transferred to recliner chair. Initially, BP at 107/67 and increasing to 118/84 sitting EOB. Replaced 2 L O2  for transfer with readings at 90%. Pt overall Mod A for self feeding with constant cues needed for pacing, sequencing, and posture. Assistance needed to hold cup with straw to mouth, place small banana pieces in hand (pt attempted to place unpeeled banana in mouth). Pt also noted to cough with liquids, consulted MD for SLP referral to ensure pt safety. Recommend SNF at DC based on current functional abilities. Will continue to follow acutely.    Follow Up Recommendations  SNF;Supervision/Assistance - 24  hour    Equipment Recommendations  None recommended by OT    Recommendations for Other Services Speech consult     Precautions / Restrictions Precautions Precautions: Fall;Other (comment)(Airborne) Restrictions Weight Bearing Restrictions: No      Mobility Bed Mobility Overal bed mobility: Needs Assistance Bed Mobility: Supine to Sit     Supine to sit: Max assist;+2 for physical assistance;HOB elevated     General bed mobility comments: hand over hand assist for patient to attempt to use bedrail to assist   Transfers Overall transfer level: Needs assistance   Transfers: Sit to/from Stand Sit to Stand: Mod assist;+2 physical assistance;From elevated surface         General transfer comment: sit>stand from EOB with Stedy and bed height elevated for transfer to recliner chair    Balance Overall balance assessment: Needs assistance Sitting-balance support: Bilateral upper extremity supported;Single extremity supported;Feet supported Sitting balance-Leahy Scale: Poor(progressing to fair) Sitting balance - Comments: initially requiring mod/maxA to sit EOB, left lean but improved to close supervision sitting balance at EOB for approx one minute Postural control: Left lateral lean Standing balance support: Bilateral upper extremity supported Standing balance-Leahy Scale: Zero Standing balance comment: modA with Stedy for standing with Stedy, left lean noted                           ADL either performed or assessed with clinical judgement   ADL Overall ADL's : Needs assistance/impaired Eating/Feeding: Moderate assistance;Sitting;Cueing for safety;Cueing for sequencing Eating/Feeding Details (indicate cue type and reason): Pt attempted to eat  unpeeled banana, required cues for pacing, assistance to hold cup and coughing noted Grooming: Moderate assistance;Sitting   Upper Body Bathing: Maximal assistance;Sitting   Lower Body Bathing: Total assistance;Bed  level   Upper Body Dressing : Maximal assistance;Sitting   Lower Body Dressing: Total assistance;Bed level   Toilet Transfer: +2 for physical assistance Toilet Transfer Details (indicate cue type and reason): Did not attempt, suspect +2 Toileting- Clothing Manipulation and Hygiene: Total assistance;Bed level               Vision         Perception     Praxis      Pertinent Vitals/Pain Pain Assessment: Faces Faces Pain Scale: No hurt     Hand Dominance Right   Extremity/Trunk Assessment Upper Extremity Assessment Upper Extremity Assessment: Generalized weakness   Lower Extremity Assessment Lower Extremity Assessment: Defer to PT evaluation       Communication Communication Communication: Expressive difficulties   Cognition Arousal/Alertness: Awake/alert Behavior During Therapy: Restless Overall Cognitive Status: History of cognitive impairments - at baseline(history Alzheimer's dementia)                                     General Comments  BP at start of session supine in bed: 107/67 at rest, BP sitting EOB: 118/84, unable to get oxygen saturation during bed mobility and transfer, 2L [place] placed on patient once sitting EOB as nurse noted patient desated to 80s on room air earlier, oxygen down to 81% once reading achieved, 92% at end of session on 2L [place], HR 82 bpm at rest    Exercises     Shoulder Instructions      Home Living Family/patient expects to be discharged to:: Assisted living                             Home Equipment: Walker - 2 wheels;Wheelchair - manual   Additional Comments: Pt unable to provide further details on PLOF, reporting "does not really remember"       Prior Functioning/Environment Level of Independence: Needs assistance  Gait / Transfers Assistance Needed: Has not used RW since Jan 2020 (per old PT eval) ADL's / Homemaking Assistance Needed: Recently has required extensive assist with ADLs per  staff   Comments: Pt unable to provide further details on PLOF due to pt reporting she "doesnt really remember"        OT Problem List: Decreased strength;Decreased activity tolerance;Impaired balance (sitting and/or standing);Decreased coordination;Decreased cognition;Decreased safety awareness;Decreased knowledge of use of DME or AE      OT Treatment/Interventions: Self-care/ADL training;Therapeutic exercise;Energy conservation;DME and/or AE instruction;Therapeutic activities;Patient/family education    OT Goals(Current goals can be found in the care plan section) Acute Rehab OT Goals Patient Stated Goal: get out of here OT Goal Formulation: With patient Time For Goal Achievement: 09/30/19 Potential to Achieve Goals: Fair  OT Frequency: Min 2X/week   Barriers to D/C:            Co-evaluation   Reason for Co-Treatment: Complexity of the patient's impairments (multi-system involvement);For patient/therapist safety;Necessary to address cognition/behavior during functional activity          AM-PAC OT "6 Clicks" Daily Activity     Outcome Measure Help from another person eating meals?: A Little Help from another person taking care of personal grooming?: A Little Help from another person  toileting, which includes using toliet, bedpan, or urinal?: Total Help from another person bathing (including washing, rinsing, drying)?: A Lot Help from another person to put on and taking off regular upper body clothing?: A Lot Help from another person to put on and taking off regular lower body clothing?: Total 6 Click Score: 12   End of Session Equipment Utilized During Treatment: Gait belt;Other (comment);Oxygen(Stedy) Nurse Communication: Mobility status  Activity Tolerance: Patient tolerated treatment well;Patient limited by fatigue Patient left: in chair;with call bell/phone within reach;with chair alarm set  OT Visit Diagnosis: Unsteadiness on feet (R26.81);Other abnormalities of  gait and mobility (R26.89);Muscle weakness (generalized) (M62.81);Feeding difficulties (R63.3)                Time: [id number] OT Time Calculation (min): 39 min Charges:  OT General Charges $OT Visit: 1 Visit OT Evaluation $OT Eval Moderate Complexity: 1 Mod  Layla Maw, OTR/L  Layla Maw 09/16/2019, 2:14 PM

## 2019-09-17 LAB — CBC WITH DIFFERENTIAL/PLATELET
Abs Immature Granulocytes: 0.05 10*3/uL (ref 0.00–0.07)
Basophils Absolute: 0 10*3/uL (ref 0.0–0.1)
Basophils Relative: 0 %
Eosinophils Absolute: 0 10*3/uL (ref 0.0–0.5)
Eosinophils Relative: 0 %
HCT: 38.7 % (ref 36.0–46.0)
Hemoglobin: 12.5 g/dL (ref 12.0–15.0)
Immature Granulocytes: 1 %
Lymphocytes Relative: 8 %
Lymphs Abs: 0.7 10*3/uL (ref 0.7–4.0)
MCH: 30 pg (ref 26.0–34.0)
MCHC: 32.3 g/dL (ref 30.0–36.0)
MCV: 92.8 fL (ref 80.0–100.0)
Monocytes Absolute: 0.3 10*3/uL (ref 0.1–1.0)
Monocytes Relative: 3 %
Neutro Abs: 7.4 10*3/uL (ref 1.7–7.7)
Neutrophils Relative %: 88 %
Platelets: 254 10*3/uL (ref 150–400)
RBC: 4.17 MIL/uL (ref 3.87–5.11)
RDW: 13.8 % (ref 11.5–15.5)
WBC: 8.5 10*3/uL (ref 4.0–10.5)
nRBC: 0.2 % (ref 0.0–0.2)

## 2019-09-17 LAB — PROCALCITONIN: Procalcitonin: 0.25 ng/mL

## 2019-09-17 LAB — COMPREHENSIVE METABOLIC PANEL
ALT: 20 U/L (ref 0–44)
AST: 38 U/L (ref 15–41)
Albumin: 2.6 g/dL — ABNORMAL LOW (ref 3.5–5.0)
Alkaline Phosphatase: 68 U/L (ref 38–126)
Anion gap: 14 (ref 5–15)
BUN: 59 mg/dL — ABNORMAL HIGH (ref 8–23)
CO2: 26 mmol/L (ref 22–32)
Calcium: 8.3 mg/dL — ABNORMAL LOW (ref 8.9–10.3)
Chloride: 103 mmol/L (ref 98–111)
Creatinine, Ser: 1.4 mg/dL — ABNORMAL HIGH (ref 0.44–1.00)
GFR calc Af Amer: 39 mL/min — ABNORMAL LOW (ref >60)
GFR calc non Af Amer: 34 mL/min — ABNORMAL LOW (ref >60)
Glucose, Bld: 209 mg/dL — ABNORMAL HIGH (ref 70–99)
Potassium: 4 mmol/L (ref 3.5–5.1)
Sodium: 143 mmol/L (ref 135–145)
Total Bilirubin: 0.8 mg/dL (ref 0.3–1.2)
Total Protein: 6 g/dL — ABNORMAL LOW (ref 6.5–8.1)

## 2019-09-17 LAB — D-DIMER, QUANTITATIVE: D-Dimer, Quant: 1.7 ug/mL-FEU — ABNORMAL HIGH (ref 0.00–0.50)

## 2019-09-17 LAB — GLUCOSE, CAPILLARY
Glucose-Capillary: 204 mg/dL — ABNORMAL HIGH (ref 70–99)
Glucose-Capillary: 144 mg/dL — ABNORMAL HIGH (ref 70–99)
Glucose-Capillary: 90 mg/dL (ref 70–99)
Glucose-Capillary: 31 mg/dL — CL (ref 70–99)
Glucose-Capillary: 72 mg/dL (ref 70–99)

## 2019-09-17 LAB — BRAIN NATRIURETIC PEPTIDE: B Natriuretic Peptide: 66.2 pg/mL (ref 0.0–100.0)

## 2019-09-17 LAB — C-REACTIVE PROTEIN: CRP: 11 mg/dL — ABNORMAL HIGH (ref <1.0)

## 2019-09-17 LAB — MAGNESIUM: Magnesium: 2.5 mg/dL — ABNORMAL HIGH (ref 1.7–2.4)

## 2019-09-17 MED ORDER — LACTATED RINGERS IV SOLN: INTRAVENOUS | Status: AC

## 2019-09-17 NOTE — Progress Notes (Signed)
PROGRESS NOTE                                                                                                                                                                                                             Patient Demographics:    Bianca Wilson, is a 86 y.o. female, DOB - 1931-02-08, [id number]  Outpatient Primary MD for the patient is Bonnita Nasuti, MD    LOS - 2  Admit date - 09/15/2019    No chief complaint on file.      Brief Narrative - Bianca Wilson is a 86 y.o. female with medical history significant of, CKD stage III ,Alzheimer dementia, hypertension, hyperlipidemia, type 2 diabetes mellitus, hypothyroidism and recent diagnosis of COVID-19 positive test twice, initially on 09/06/2019 and then 09-13-2019 initially brought to [facility] via EMS from [facility], was diagnosed with acute hypoxic respiratory failure due to COVID-19 pneumonia and sent to [facility] for further treatment.   Subjective:   Patient in bed, appears comfortable, denies any headache, no fever, no chest pain or pressure, no shortness of breath , no abdominal pain. No focal weakness.    Assessment  & Plan :     1. Acute Hypoxic Resp. Failure due to Acute Covid 19 Viral Pneumonitis during the ongoing 2020 Covid 19 Pandemic - she appears to have had severe disease upon presentation requiring 15 L nasal cannula oxygen, she has been appropriately treated with IV steroids, remdesivir and Actemra upon arrival.  She has shown good improvement currently down to 3 L nasal cannula oxygen and appears to be improving.  Inflammatory markers will be trended.  Encouraged the patient to sit up in chair in the daytime use I-S and flutter valve for pulmonary toiletry and then prone in bed when at night.   SpO2: 97 % O2 Flow Rate (L/min): 3 L/min  Recent Labs  Lab 09/15/19 1700 09/16/19 0720 09/17/19 0437  CRP  --  17.7*  11.0*  DDIMER  --  1.54* 1.70*  FERRITIN 303  --   --   BNP  --  38.7 66.2  PROCALCITON 0.40  --  0.25    Hepatic Function Latest Ref Rng & Units 09/17/2019 09/16/2019 07/01/2018  Total Protein 6.5 - 8.1 g/dL 6.0(L) 6.3(L) 6.5  Albumin 3.5 - 5.0 g/dL 2.6(L) 2.5(L) 3.6  AST 15 - 41 U/L 38 28 18  ALT 0 - 44 U/L '20 16 12  ' Alk Phosphatase 38 - 126 U/L 68 70 77  Total Bilirubin 0.3 - 1.2 mg/dL 0.8 0.8 0.5    2.  Dehydration with hypernatremia and AKI.  Improved after hydration with D5W.  3.  Hypothyroidism.  On home dose Synthroid.  4.  Dyslipidemia.  Continue home dose statin.  5.  Advanced underlying Alzheimer's dementia, at risk for delirium, supportive care.  Minimize benzodiazepines and narcotics.  6.  Essential hypertension.  Continue Norvasc.  7. DM2 - on SSI, due to steroids we will add some Lantus.  Monitor.  Lab Results  Component Value Date   HGBA1C 6.5 (H) 09/15/2019   CBG (last 3)  Recent Labs    09/16/19 1821 09/16/19 2129 09/17/19 0755  GLUCAP 147* 153* 204*     Condition -  Guarded  Family Communication  :  Daughter 09/16/19, 09/17/2019  Code Status :  DNR  Diet :   Diet Order            DIET SOFT Room service appropriate? Yes; Fluid consistency: Nectar Thick  Diet effective now               Disposition Plan  : In the hospital, getting treatment for severe COVID-19 infection.  Will finish her treatment course most likely on 09/19/2019. Then SNF now per daughter.  Consults  :  None  Procedures  :  None  PUD Prophylaxis : None  DVT Prophylaxis  :  Lovenox   Lab Results  Component Value Date   PLT 254 09/17/2019    Inpatient Medications  Scheduled Meds: . albuterol  2 puff Inhalation Q6H  . amLODipine  10 mg Oral Daily  . dexamethasone (DECADRON) injection  6 mg Intravenous Q24H  . divalproex  500 mg Oral QHS  . donepezil  10 mg Oral QHS  . enoxaparin (LOVENOX) injection  30 mg Subcutaneous Q24H  . feeding supplement (ENSURE ENLIVE)   237 mL Oral TID BM  . feeding supplement (PRO-STAT SUGAR FREE 64)  30 mL Oral BID  . insulin aspart  0-15 Units Subcutaneous TID WC  . insulin aspart  0-5 Units Subcutaneous QHS  . insulin aspart  3 Units Subcutaneous TID WC  . insulin glargine  25 Units Subcutaneous Daily  . levothyroxine  50 mcg Oral Q0600  . memantine  10 mg Oral BID  . simvastatin  20 mg Oral Daily   Continuous Infusions: . famotidine (PEPCID) IV Stopped (09/17/19 3383)  . remdesivir 100 mg in NS 100 mL 100 mg (09/17/19 0843)   PRN Meds:.acetaminophen, ondansetron **OR** ondansetron (ZOFRAN) IV, polyethylene glycol  Antibiotics  :    Anti-infectives (From admission, onward)   Start     Dose/Rate Route Frequency Ordered Stop   09/17/19 1000  remdesivir 100 mg in sodium chloride 0.9 % 100 mL IVPB  Status:  Discontinued     100 mg 200 mL/hr over 30 Minutes Intravenous Daily 09/15/19 1609 09/15/19 1611   09/16/19 1000  remdesivir 100 mg in sodium chloride 0.9 % 100 mL IVPB  Status:  Discontinued     100 mg 200 mL/hr over 30 Minutes Intravenous Daily 09/15/19 1612 09/15/19 1653   09/16/19 1000  remdesivir 100 mg in sodium chloride 0.9 % 100 mL IVPB     100 mg 200 mL/hr over 30 Minutes Intravenous Daily 09/15/19 1653 09/20/19 0959   09/16/19 0900  remdesivir  200 mg in sodium chloride 0.9% 250 mL IVPB  Status:  Discontinued     200 mg 580 mL/hr over 30 Minutes Intravenous Once 09/15/19 1609 09/15/19 1611   09/15/19 1700  remdesivir 100 mg in sodium chloride 0.9 % 100 mL IVPB  Status:  Discontinued     100 mg 200 mL/hr over 30 Minutes Intravenous Every 1 hr x 2 09/15/19 1612 09/15/19 1653       Time Spent in minutes  Kenney M.D on 09/17/2019 at 11:51 AM  To page go to [email or web address] - password [email or web address]  [facility] -  Office  [phone]     See all Orders from today for further details    Objective:   Vitals:   09/16/19 1117 09/16/19 2126 09/17/19 0500 09/17/19 0549  BP: 131/80 (!)  119/53  123/64  Pulse: (!) 118 62  72  Resp: '18 16  18  ' Temp: (!) 97.4 F (36.3 C) (!) 97.5 F (36.4 C)  (!) 97.5 F (36.4 C)  TempSrc: Oral Oral  Oral  SpO2: 90% 91%  97%  Weight:   59.4 kg   Height:        Wt Readings from Last 3 Encounters:  09/17/19 59.4 kg  08/04/18 69.2 kg  07/01/18 73 kg     Intake/Output Summary (Last 24 hours) at 09/17/2019 1151 Last data filed at 09/17/2019 1000 Gross per 24 hour  Intake 2135.28 ml  Output 250 ml  Net 1885.28 ml     Physical Exam  Awake stays confused, moving all 4 extremities by herself [place].AT,PERRAL Supple Neck,No JVD, No cervical lymphadenopathy appriciated.  Symmetrical Chest wall movement, Good air movement bilaterally, CTAB RRR,No Gallops, Rubs or new Murmurs, No Parasternal Heave +ve B.Sounds, Abd Soft, No tenderness, No organomegaly appriciated, No rebound - guarding or rigidity. No Cyanosis, Clubbing or edema, No new Rash or bruise     Data Review:    CBC Recent Labs  Lab 09/15/19 1700 09/16/19 0720 09/17/19 0437  WBC 10.3 8.2 8.5  HGB 13.2 13.5 12.5  HCT 41.4 42.3 38.7  PLT 199 246 254  MCV 92.4 94.6 92.8  MCH 29.5 30.2 30.0  MCHC 31.9 31.9 32.3  RDW 13.7 13.8 13.8  LYMPHSABS  --  0.8 0.7  MONOABS  --  0.3 0.3  EOSABS  --  0.0 0.0  BASOSABS  --  0.0 0.0    Chemistries  Recent Labs  Lab 09/15/19 1700 09/16/19 0720 09/17/19 0437  NA  --  149* 143  K  --  4.8 4.0  CL  --  107 103  CO2  --  27 26  GLUCOSE  --  278* 209*  BUN  --  57* 59*  CREATININE 1.73* 1.45* 1.40*  CALCIUM  --  8.6* 8.3*  MG  --  2.6* 2.5*  AST  --  28 38  ALT  --  16 20  ALKPHOS  --  70 68  BILITOT  --  0.8 0.8   ------------------------------------------------------------------------------------------------------------------ No results for input(s): CHOL, HDL, LDLCALC, TRIG, CHOLHDL, LDLDIRECT in the last 72 hours.  Lab Results  Component Value Date   HGBA1C 6.5 (H) 09/15/2019    ------------------------------------------------------------------------------------------------------------------ No results for input(s): TSH, T4TOTAL, T3FREE, THYROIDAB in the last 72 hours.  Invalid input(s): FREET3  Cardiac Enzymes No results for input(s): CKMB, TROPONINI, MYOGLOBIN in the last 168 hours.  Invalid input(s): CK ------------------------------------------------------------------------------------------------------------------    Component Value Date/Time   BNP 66.2  09/17/2019 0437    Micro Results No results found for this or any previous visit (from the past 240 hour(s)).  Radiology Reports No results found.

## 2019-09-17 NOTE — Evaluation (Signed)
Clinical/Bedside Swallow Evaluation Patient Details  Name: Bianca Wilson MRN: [id number] Date of Birth: [birth year]  Today's Date: 09/17/2019 Time: SLP Start Time (ACUTE ONLY): 1435 SLP Stop Time (ACUTE ONLY): 1458 SLP Time Calculation (min) (ACUTE ONLY): 23 min  Past Medical History:  Past Medical History:  Diagnosis Date  . Alzheimer disease ([place])   . Chronic kidney disease    stage 3  . Diabetes mellitus without complication ([place])    type 2  . Dysphagia   . Hypertension   . Osteoporosis    Past Surgical History:  Past Surgical History:  Procedure Laterality Date  . BURR HOLE Right 08/04/2018   Procedure: Right [facility] for evacuation of subdural hematoma;  Surgeon: Judith Part, MD;  Location: [place];  Service: Neurosurgery;  Laterality: Right;   HPI:  86 y.o. female with medical history significant of, CKD stage III ,Alzheimer dementia, hypertension, hyperlipidemia, type 2 diabetes mellitus, hypothyroidism and recent diagnosis of COVID-19 positive test twice, initially on 09/06/2019 and then 09-13-2019 initially brought to [facility] via EMS from [place] assisted living, was diagnosed with acute hypoxic respiratory failure due to COVID-19 pneumonia and sent to [facility] for further treatment. Pt was seen by SLP services during an admission for a fall in Dec 2019 - at that time, she had a chronic dysphagia and was D/Cd on a dysphagia 1 diet with nectar thick liquids.    Assessment / Plan / Recommendation Clinical Impression  Pt participated in clinical swallowing evaluation.  She was unable to follow commands, language was consistent with advanced dementia.  Mitt restraint was removed and pt fed herself portions of a soft cookie and held a cup with assist to drink thin liquids from a straw.  Primary deficits appear to be related to prolonged and slightly perseverative oral phase. Pt tended to hold cookie just outside her lips with ongoing mastication despite its absence  from her mouth. Once solids were received, there was delayed manipulation, requiring cues to swallow.  There were no s/s of aspiration, even with successive sips of thin liquid.  Placed call to pt's daughter, Bianca Wilson, to inquire about pt's ability to drink thin liquids PTA but she was unavailable.  Given performance today, would recommend a dysphagia 1 diet with thin liquids; meds crushed in puree. SLP will follow briefly for toleration/education.   SLP Visit Diagnosis: Dysphagia, oral phase (R13.11)    Aspiration Risk  Mild aspiration risk    Diet Recommendation   dysphagia 1, thin liquids   Medication Administration: Crushed with puree    Other  Recommendations Oral Care Recommendations: Oral care BID   Follow up Recommendations None      Frequency and Duration min 1 x/week  1 week       Prognosis        Swallow Study   General Date of Onset: 09/16/19 HPI: 86 y.o. female with medical history significant of, CKD stage III ,Alzheimer dementia, hypertension, hyperlipidemia, type 2 diabetes mellitus, hypothyroidism and recent diagnosis of COVID-19 positive test twice, initially on 09/06/2019 and then 09-13-2019 initially brought to [facility] via EMS from [place] assisted living, was diagnosed with acute hypoxic respiratory failure due to COVID-19 pneumonia and sent to [facility] for further treatment. Pt was seen by SLP services during an admission for a fall in Dec 2019 - at that time, she had a chronic dysphagia and was D/Cd on a dysphagia 1 diet with nectar thick liquids.  Type of Study: Bedside  Swallow Evaluation Previous Swallow Assessment: see HPI Diet Prior to this Study: Dysphagia 3 (soft);Nectar-thick liquids Temperature Spikes Noted: No Respiratory Status: Room air History of Recent Intubation: No Behavior/Cognition: Alert;Confused Oral Cavity Assessment: Within Functional Limits Oral Cavity - Dentition: Dentures, top Self-Feeding Abilities: Needs  assist Patient Positioning: Upright in bed Baseline Vocal Quality: Normal Volitional Cough: Cognitively unable to elicit Volitional Swallow: Unable to elicit    Oral/Motor/Sensory Function     Ice Chips Ice chips: Within functional limits   Thin Liquid Thin Liquid: Within functional limits Presentation: Straw    Nectar Thick Nectar Thick Liquid: Within functional limits Presentation: Straw   Honey Thick Honey Thick Liquid: Not tested   Puree Puree: Impaired Presentation: Spoon Oral Phase Functional Implications: Prolonged oral transit   Solid     Solid: Impaired Oral Phase Functional Implications: Prolonged oral transit      Bianca Wilson 09/17/2019,3:02 PM  Bianca Wilson, Bianca Wilson Office number [phone] Pager [phone]

## 2019-09-18 LAB — COMPREHENSIVE METABOLIC PANEL
ALT: 25 U/L (ref 0–44)
AST: 50 U/L — ABNORMAL HIGH (ref 15–41)
Albumin: 2.4 g/dL — ABNORMAL LOW (ref 3.5–5.0)
Alkaline Phosphatase: 69 U/L (ref 38–126)
Anion gap: 16 — ABNORMAL HIGH (ref 5–15)
BUN: 54 mg/dL — ABNORMAL HIGH (ref 8–23)
CO2: 23 mmol/L (ref 22–32)
Calcium: 8.2 mg/dL — ABNORMAL LOW (ref 8.9–10.3)
Chloride: 103 mmol/L (ref 98–111)
Creatinine, Ser: 1.23 mg/dL — ABNORMAL HIGH (ref 0.44–1.00)
GFR calc Af Amer: 46 mL/min — ABNORMAL LOW (ref >60)
GFR calc non Af Amer: 40 mL/min — ABNORMAL LOW (ref >60)
Glucose, Bld: 228 mg/dL — ABNORMAL HIGH (ref 70–99)
Potassium: 4.1 mmol/L (ref 3.5–5.1)
Sodium: 142 mmol/L (ref 135–145)
Total Bilirubin: 0.7 mg/dL (ref 0.3–1.2)
Total Protein: 5.6 g/dL — ABNORMAL LOW (ref 6.5–8.1)

## 2019-09-18 LAB — CBC WITH DIFFERENTIAL/PLATELET
Abs Immature Granulocytes: 0.06 10*3/uL (ref 0.00–0.07)
Basophils Absolute: 0 10*3/uL (ref 0.0–0.1)
Basophils Relative: 0 %
Eosinophils Absolute: 0 10*3/uL (ref 0.0–0.5)
Eosinophils Relative: 0 %
HCT: 37.8 % (ref 36.0–46.0)
Hemoglobin: 12.2 g/dL (ref 12.0–15.0)
Immature Granulocytes: 1 %
Lymphocytes Relative: 9 %
Lymphs Abs: 0.6 10*3/uL — ABNORMAL LOW (ref 0.7–4.0)
MCH: 29.8 pg (ref 26.0–34.0)
MCHC: 32.3 g/dL (ref 30.0–36.0)
MCV: 92.4 fL (ref 80.0–100.0)
Monocytes Absolute: 0.1 10*3/uL (ref 0.1–1.0)
Monocytes Relative: 2 %
Neutro Abs: 5.7 10*3/uL (ref 1.7–7.7)
Neutrophils Relative %: 88 %
Platelets: 243 10*3/uL (ref 150–400)
RBC: 4.09 MIL/uL (ref 3.87–5.11)
RDW: 13.6 % (ref 11.5–15.5)
WBC: 6.4 10*3/uL (ref 4.0–10.5)
nRBC: 0 % (ref 0.0–0.2)

## 2019-09-18 LAB — D-DIMER, QUANTITATIVE: D-Dimer, Quant: 1.02 ug/mL-FEU — ABNORMAL HIGH (ref 0.00–0.50)

## 2019-09-18 LAB — C-REACTIVE PROTEIN: CRP: 6.7 mg/dL — ABNORMAL HIGH (ref <1.0)

## 2019-09-18 LAB — PROCALCITONIN: Procalcitonin: 0.2 ng/mL

## 2019-09-18 LAB — GLUCOSE, CAPILLARY
Glucose-Capillary: 194 mg/dL — ABNORMAL HIGH (ref 70–99)
Glucose-Capillary: 186 mg/dL — ABNORMAL HIGH (ref 70–99)
Glucose-Capillary: 109 mg/dL — ABNORMAL HIGH (ref 70–99)
Glucose-Capillary: 151 mg/dL — ABNORMAL HIGH (ref 70–99)

## 2019-09-18 LAB — BRAIN NATRIURETIC PEPTIDE: B Natriuretic Peptide: 91.2 pg/mL (ref 0.0–100.0)

## 2019-09-18 LAB — MAGNESIUM: Magnesium: 2.6 mg/dL — ABNORMAL HIGH (ref 1.7–2.4)

## 2019-09-18 MED ORDER — ALBUTEROL SULFATE HFA 108 (90 BASE) MCG/ACT IN AERS
2.0000 | INHALATION_SPRAY | Freq: Four times a day (QID) | RESPIRATORY_TRACT | Status: DC
  Administered 2019-09-18 – 2019-09-19 (×5): 2 via RESPIRATORY_TRACT

## 2019-09-18 NOTE — Progress Notes (Signed)
Pt's daughter [name] called, updated & spoke to Pt via speaker phone in room.    09/18/19 0900  Family/Significant Other Communication  Family/Significant Other Update Called;Updated

## 2019-09-18 NOTE — Progress Notes (Signed)
PROGRESS NOTE                                                                                                                                                                                                             Patient Demographics:    Bianca Wilson, is a 86 y.o. female, DOB - 12/02/1930, [id number]  Outpatient Primary MD for the patient is Bonnita Nasuti, MD    LOS - 3  Admit date - 09/15/2019    No chief complaint on file.      Brief Narrative - Bianca Wilson is a 86 y.o. female with medical history significant of, CKD stage III ,Alzheimer dementia, hypertension, hyperlipidemia, type 2 diabetes mellitus, hypothyroidism and recent diagnosis of COVID-19 positive test twice, initially on 09/06/2019 and then 09-13-2019 initially brought to [facility] via EMS from [place] assisted living, was diagnosed with acute hypoxic respiratory failure due to COVID-19 pneumonia and sent to [facility] for further treatment.   Subjective:   Patient in bed, appears comfortable, denies any headache, no fever, no chest pain or pressure, no shortness of breath , no abdominal pain. No focal weakness.   Assessment  & Plan :     1. Acute Hypoxic Resp. Failure due to Acute Covid 19 Viral Pneumonitis during the ongoing 2020 Covid 19 Pandemic - she appears to have had severe disease upon presentation requiring 15 L nasal cannula oxygen, she has been appropriately treated with IV steroids, remdesivir and Actemra upon arrival.  She has shown good improvement currently down to 3 L nasal cannula oxygen and appears to be improving.  Inflammatory markers will be trended.  Encouraged the patient to sit up in chair in the daytime use I-S and flutter valve for pulmonary toiletry and then prone in bed when at night.   Recent Labs  Lab 09/15/19 1700 09/16/19 0720 09/17/19 0437 09/18/19 0303  CRP  --  17.7* 11.0* 6.7*  DDIMER  --  1.54* 1.70*  1.02*  FERRITIN 303  --   --   --   BNP  --  38.7 66.2 91.2  PROCALCITON 0.40  --  0.25 0.20    Hepatic Function Latest Ref Rng & Units 09/18/2019 09/17/2019 09/16/2019  Total Protein 6.5 - 8.1 g/dL 5.6(L) 6.0(L) 6.3(L)  Albumin 3.5 - 5.0 g/dL 2.4(L) 2.6(L) 2.5(L)  AST  15 - 41 U/L 50(H) 38 28  ALT 0 - 44 U/L '25 20 16  ' Alk Phosphatase 38 - 126 U/L 69 68 70  Total Bilirubin 0.3 - 1.2 mg/dL 0.7 0.8 0.8    2.  Dehydration with hypernatremia and AKI.  Improved after hydration with D5W.  3.  Hypothyroidism.  On home dose Synthroid.  4.  Dyslipidemia.  Continue home dose statin.  5.  Advanced underlying Alzheimer's dementia, at risk for delirium, supportive care.  Minimize benzodiazepines and narcotics.  6.  Essential hypertension.  Continue Norvasc.  7. DM2 - on SSI, due to steroids we will add some Lantus.  Monitor.  Lab Results  Component Value Date   HGBA1C 6.5 (H) 09/15/2019   CBG (last 3)  Recent Labs    09/17/19 1953 09/17/19 2239 09/18/19 0812  GLUCAP 31* 72 194*     Condition -  Guarded  Family Communication  :  Daughter 09/16/19, 09/17/2019  Code Status :  DNR  Diet :   Diet Order            DIET - DYS 1 Room service appropriate? No; Fluid consistency: Thin  Diet effective now               Disposition Plan  : In the hospital, getting treatment for severe COVID-19 infection.  Will finish her treatment course most likely on 09/19/2019. Then SNF now per daughter.  Consults  :  None  Procedures  :  None  PUD Prophylaxis : None  DVT Prophylaxis  :  Lovenox   Lab Results  Component Value Date   PLT 243 09/18/2019    Inpatient Medications  Scheduled Meds: . albuterol  2 puff Inhalation Q6H  . amLODipine  10 mg Oral Daily  . dexamethasone (DECADRON) injection  6 mg Intravenous Q24H  . divalproex  500 mg Oral QHS  . donepezil  10 mg Oral QHS  . enoxaparin (LOVENOX) injection  30 mg Subcutaneous Q24H  . feeding supplement (ENSURE ENLIVE)  237 mL Oral  TID BM  . feeding supplement (PRO-STAT SUGAR FREE 64)  30 mL Oral BID  . insulin aspart  0-15 Units Subcutaneous TID WC  . insulin aspart  0-5 Units Subcutaneous QHS  . insulin aspart  3 Units Subcutaneous TID WC  . insulin glargine  25 Units Subcutaneous Daily  . levothyroxine  50 mcg Oral Q0600  . memantine  10 mg Oral BID  . simvastatin  20 mg Oral Daily   Continuous Infusions: . famotidine (PEPCID) IV 20 mg (09/17/19 2306)  . lactated ringers Stopped (09/17/19 2304)  . remdesivir 100 mg in NS 100 mL Stopped (09/17/19 1712)   PRN Meds:.acetaminophen, ondansetron **OR** ondansetron (ZOFRAN) IV, polyethylene glycol  Antibiotics  :    Anti-infectives (From admission, onward)   Start     Dose/Rate Route Frequency Ordered Stop   09/17/19 1000  remdesivir 100 mg in sodium chloride 0.9 % 100 mL IVPB  Status:  Discontinued     100 mg 200 mL/hr over 30 Minutes Intravenous Daily 09/15/19 1609 09/15/19 1611   09/16/19 1000  remdesivir 100 mg in sodium chloride 0.9 % 100 mL IVPB  Status:  Discontinued     100 mg 200 mL/hr over 30 Minutes Intravenous Daily 09/15/19 1612 09/15/19 1653   09/16/19 1000  remdesivir 100 mg in sodium chloride 0.9 % 100 mL IVPB     100 mg 200 mL/hr over 30 Minutes Intravenous Daily 09/15/19 1653 09/20/19  1030   09/16/19 0900  remdesivir 200 mg in sodium chloride 0.9% 250 mL IVPB  Status:  Discontinued     200 mg 580 mL/hr over 30 Minutes Intravenous Once 09/15/19 1609 09/15/19 1611   09/15/19 1700  remdesivir 100 mg in sodium chloride 0.9 % 100 mL IVPB  Status:  Discontinued     100 mg 200 mL/hr over 30 Minutes Intravenous Every 1 hr x 2 09/15/19 1612 09/15/19 1653       Time Spent in minutes  [place] M.D on 09/18/2019 at 11:24 AM  To page go to [email or web address] - password [facility]  [facility] -  Office  [phone]     See all Orders from today for further details    Objective:   Vitals:   09/17/19 1254 09/17/19 1948 09/18/19 0500  09/18/19 0523  BP: (!) 117/48 (!) 121/33  134/68  Pulse: 70 (!) 59  90  Resp: 17     Temp: 97.9 F (36.6 C)   (!) 97.5 F (36.4 C)  TempSrc: Axillary   Oral  SpO2: 95%   (!) 86%  Weight:   59.8 kg   Height:        Wt Readings from Last 3 Encounters:  09/18/19 59.8 kg  08/04/18 69.2 kg  07/01/18 73 kg     Intake/Output Summary (Last 24 hours) at 09/18/2019 1124 Last data filed at 09/18/2019 0900 Gross per 24 hour  Intake 1381.45 ml  Output --  Net 1381.45 ml     Physical Exam  Awake stays confused, moving all 4 extremities by herself Rudy.AT,PERRAL Supple Neck,No JVD, No cervical lymphadenopathy appriciated.  Symmetrical Chest wall movement, Good air movement bilaterally, CTAB RRR,No Gallops, Rubs or new Murmurs, No Parasternal Heave +ve B.Sounds, Abd Soft, No tenderness, No organomegaly appriciated, No rebound - guarding or rigidity. No Cyanosis, Clubbing or edema, No new Rash or bruise    Data Review:    CBC Recent Labs  Lab 09/15/19 1700 09/16/19 0720 09/17/19 0437 09/18/19 0303  WBC 10.3 8.2 8.5 6.4  HGB 13.2 13.5 12.5 12.2  HCT 41.4 42.3 38.7 37.8  PLT 199 246 254 243  MCV 92.4 94.6 92.8 92.4  MCH 29.5 30.2 30.0 29.8  MCHC 31.9 31.9 32.3 32.3  RDW 13.7 13.8 13.8 13.6  LYMPHSABS  --  0.8 0.7 0.6*  MONOABS  --  0.3 0.3 0.1  EOSABS  --  0.0 0.0 0.0  BASOSABS  --  0.0 0.0 0.0    Chemistries  Recent Labs  Lab 09/15/19 1700 09/16/19 0720 09/17/19 0437 09/18/19 0303  NA  --  149* 143 142  K  --  4.8 4.0 4.1  CL  --  107 103 103  CO2  --  '27 26 23  ' GLUCOSE  --  278* 209* 228*  BUN  --  57* 59* 54*  CREATININE 1.73* 1.45* 1.40* 1.23*  CALCIUM  --  8.6* 8.3* 8.2*  MG  --  2.6* 2.5* 2.6*  AST  --  28 38 50*  ALT  --  '16 20 25  ' ALKPHOS  --  70 68 69  BILITOT  --  0.8 0.8 0.7   ------------------------------------------------------------------------------------------------------------------ No results for input(s): CHOL, HDL, LDLCALC, TRIG,  CHOLHDL, LDLDIRECT in the last 72 hours.  Lab Results  Component Value Date   HGBA1C 6.5 (H) 09/15/2019   ------------------------------------------------------------------------------------------------------------------ No results for input(s): TSH, T4TOTAL, T3FREE, THYROIDAB in the last 72 hours.  Invalid input(s):  FREET3  Cardiac Enzymes No results for input(s): CKMB, TROPONINI, MYOGLOBIN in the last 168 hours.  Invalid input(s): CK ------------------------------------------------------------------------------------------------------------------    Component Value Date/Time   BNP 91.2 09/18/2019 0303    Micro Results No results found for this or any previous visit (from the past 240 hour(s)).  Radiology Reports No results found.

## 2019-09-19 DIAGNOSIS — E038 Other specified hypothyroidism: Secondary | ICD-10-CM

## 2019-09-19 DIAGNOSIS — G300 Alzheimer's disease with early onset: Secondary | ICD-10-CM

## 2019-09-19 DIAGNOSIS — J1282 Pneumonia due to coronavirus disease 2019: Secondary | ICD-10-CM

## 2019-09-19 DIAGNOSIS — F0280 Dementia in other diseases classified elsewhere without behavioral disturbance: Secondary | ICD-10-CM

## 2019-09-19 DIAGNOSIS — U071 COVID-19: Principal | ICD-10-CM

## 2019-09-19 LAB — COMPREHENSIVE METABOLIC PANEL
ALT: 23 U/L (ref 0–44)
AST: 36 U/L (ref 15–41)
Albumin: 2.2 g/dL — ABNORMAL LOW (ref 3.5–5.0)
Alkaline Phosphatase: 67 U/L (ref 38–126)
Anion gap: 13 (ref 5–15)
BUN: 65 mg/dL — ABNORMAL HIGH (ref 8–23)
CO2: 25 mmol/L (ref 22–32)
Calcium: 8.9 mg/dL (ref 8.9–10.3)
Chloride: 104 mmol/L (ref 98–111)
Creatinine, Ser: 1.24 mg/dL — ABNORMAL HIGH (ref 0.44–1.00)
GFR calc Af Amer: 46 mL/min — ABNORMAL LOW (ref >60)
GFR calc non Af Amer: 39 mL/min — ABNORMAL LOW (ref >60)
Glucose, Bld: 250 mg/dL — ABNORMAL HIGH (ref 70–99)
Potassium: 4.1 mmol/L (ref 3.5–5.1)
Sodium: 142 mmol/L (ref 135–145)
Total Bilirubin: 0.3 mg/dL (ref 0.3–1.2)
Total Protein: 4.9 g/dL — ABNORMAL LOW (ref 6.5–8.1)

## 2019-09-19 LAB — CBC WITH DIFFERENTIAL/PLATELET
Abs Immature Granulocytes: 0.1 10*3/uL — ABNORMAL HIGH (ref 0.00–0.07)
Basophils Absolute: 0 10*3/uL (ref 0.0–0.1)
Basophils Relative: 0 %
Eosinophils Absolute: 0 10*3/uL (ref 0.0–0.5)
Eosinophils Relative: 0 %
HCT: 34.3 % — ABNORMAL LOW (ref 36.0–46.0)
Hemoglobin: 11.3 g/dL — ABNORMAL LOW (ref 12.0–15.0)
Immature Granulocytes: 1 %
Lymphocytes Relative: 7 %
Lymphs Abs: 0.5 10*3/uL — ABNORMAL LOW (ref 0.7–4.0)
MCH: 29.7 pg (ref 26.0–34.0)
MCHC: 32.9 g/dL (ref 30.0–36.0)
MCV: 90 fL (ref 80.0–100.0)
Monocytes Absolute: 0.2 10*3/uL (ref 0.1–1.0)
Monocytes Relative: 3 %
Neutro Abs: 6.6 10*3/uL (ref 1.7–7.7)
Neutrophils Relative %: 89 %
Platelets: 239 10*3/uL (ref 150–400)
RBC: 3.81 MIL/uL — ABNORMAL LOW (ref 3.87–5.11)
RDW: 13.5 % (ref 11.5–15.5)
WBC: 7.4 10*3/uL (ref 4.0–10.5)
nRBC: 0.3 % — ABNORMAL HIGH (ref 0.0–0.2)

## 2019-09-19 LAB — PROCALCITONIN: Procalcitonin: 0.11 ng/mL

## 2019-09-19 LAB — GLUCOSE, CAPILLARY
Glucose-Capillary: 120 mg/dL — ABNORMAL HIGH (ref 70–99)
Glucose-Capillary: 199 mg/dL — ABNORMAL HIGH (ref 70–99)

## 2019-09-19 LAB — C-REACTIVE PROTEIN: CRP: 3.8 mg/dL — ABNORMAL HIGH (ref <1.0)

## 2019-09-19 LAB — D-DIMER, QUANTITATIVE: D-Dimer, Quant: 0.92 ug/mL-FEU — ABNORMAL HIGH (ref 0.00–0.50)

## 2019-09-19 LAB — BRAIN NATRIURETIC PEPTIDE: B Natriuretic Peptide: 157.8 pg/mL — ABNORMAL HIGH (ref 0.0–100.0)

## 2019-09-19 LAB — MAGNESIUM: Magnesium: 2.5 mg/dL — ABNORMAL HIGH (ref 1.7–2.4)

## 2019-09-19 MED ORDER — FAMOTIDINE 20 MG PO TABS: 20.0000 mg | ORAL_TABLET | Freq: Every day | ORAL | Status: DC

## 2019-09-19 NOTE — NC FL2 (Signed)
[place] LEVEL OF CARE SCREENING TOOL     IDENTIFICATION  Patient Name: Bianca Wilson Birthdate: 1931-06-05 Sex: female Admission Date (Current Location): 09/15/2019  [facility] and [place] Number:  [employer] and Address:  [facility] [place] [address], [place], [place] [address]      Provider Number: [id number]  Attending Physician Name and Address:  Thurnell Lose, MD  Relative Name and Phone Number:       Current Level of Care: Hospital Recommended Level of Care: [place] Prior Approval Number:    Date Approved/Denied:   PASRR Number: [id number]  Discharge Plan: SNF    Current Diagnoses: Patient Active Problem List   Diagnosis Date Noted  . Pneumonia due to COVID-19 virus 09/15/2019  . Acute respiratory failure with hypoxia ([place]) 09/15/2019  . Goals of care, counseling/discussion   . Palliative care by specialist   . HTN (hypertension) 06/16/2018  . AKI (acute kidney injury) ([place]) 06/16/2018  . Alzheimer's dementia ([place]) 06/16/2018  . At risk for falls 06/16/2018  . Depression 06/16/2018  . HLD (hyperlipidemia) 06/16/2018  . Type 2 diabetes mellitus ([place]) 06/16/2018  . Hypothyroidism 06/16/2018  . Subdural hematoma ([place]) 06/15/2018    Orientation RESPIRATION BLADDER Height & Weight     Self  O2(see DC summary) Incontinent Weight: 135 lb 9.3 oz (61.5 kg) Height:  5\' 2"  (157.5 cm)  BEHAVIORAL SYMPTOMS/MOOD NEUROLOGICAL BOWEL NUTRITION STATUS      Incontinent Diet(see DC summary)  AMBULATORY STATUS COMMUNICATION OF NEEDS Skin   Extensive Assist Verbally Normal                       Personal Care Assistance Level of Assistance  Bathing, Dressing, Feeding Bathing Assistance: Maximum assistance Feeding assistance: Limited assistance Dressing Assistance: Maximum assistance     Functional Limitations Info             SPECIAL CARE FACTORS FREQUENCY                        Contractures Contractures Info: Not present    Additional Factors Info  Code Status, Allergies, Psychotropic, Insulin Sliding Scale, Isolation Precautions Code Status Info: DNR Allergies Info: Benadryl (Diphenhydramine), Hydrocodone, Other Psychotropic Info: Depakote 500mg  daily at bed; Aricept 10mg  daily at bed Insulin Sliding Scale Info: 0-15 units 3x/day with meals; Aricept 10mg  daily at bed Isolation Precautions Info: First COVID positive on 2/23 per records     Current Medications (09/19/2019):  This is the current hospital active medication list Current Facility-Administered Medications  Medication Dose Route Frequency Provider Last Rate Last Admin  . acetaminophen (TYLENOL) tablet 650 mg  650 mg Oral Q6H PRN Darliss Cheney, MD   650 mg at 09/18/19 2140  . albuterol (VENTOLIN HFA) 108 (90 Base) MCG/ACT inhaler 2 puff  2 puff Inhalation Q6H Thurnell Lose, MD   2 puff at 09/19/19 0919  . amLODipine (NORVASC) tablet 10 mg  10 mg Oral Daily Darliss Cheney, MD   10 mg at 09/19/19 0918  . dexamethasone (DECADRON) injection 6 mg  6 mg Intravenous Q24H Darliss Cheney, MD   6 mg at 09/19/19 0919  . divalproex (DEPAKOTE) DR tablet 500 mg  500 mg Oral QHS Darliss Cheney, MD   500 mg at 09/18/19 2143  . donepezil (ARICEPT) tablet 10 mg  10 mg Oral QHS Darliss Cheney, MD   10 mg at 09/18/19 2142  .  enoxaparin (LOVENOX) injection 30 mg  30 mg Subcutaneous Q24H Karren Cobble, RPH   30 mg at 09/18/19 1842  . famotidine (PEPCID) tablet 20 mg  20 mg Oral QHS [facility], [place]      . feeding supplement (ENSURE ENLIVE) (ENSURE ENLIVE) liquid 237 mL  237 mL Oral TID BM Thurnell Lose, MD   237 mL at 09/19/19 0925  . feeding supplement (PRO-STAT SUGAR FREE 64) liquid 30 mL  30 mL Oral BID Thurnell Lose, MD   30 mL at 09/19/19 0923  . insulin aspart (novoLOG) injection 0-15 Units  0-15 Units Subcutaneous TID WC Darliss Cheney, MD   3 Units at 09/18/19 1332  . insulin aspart (novoLOG)  injection 0-5 Units  0-5 Units Subcutaneous QHS Darliss Cheney, MD   4 Units at 09/15/19 2139  . insulin aspart (novoLOG) injection 3 Units  3 Units Subcutaneous TID WC Thurnell Lose, MD   3 Units at 09/19/19 0920  . insulin glargine (LANTUS) injection 25 Units  25 Units Subcutaneous Daily Thurnell Lose, MD   25 Units at 09/18/19 0911  . levothyroxine (SYNTHROID) tablet 50 mcg  50 mcg Oral Q0600 Darliss Cheney, MD   50 mcg at 09/19/19 0514  . memantine (NAMENDA) tablet 10 mg  10 mg Oral BID Darliss Cheney, MD   10 mg at 09/19/19 0918  . ondansetron (ZOFRAN) tablet 4 mg  4 mg Oral Q6H PRN Darliss Cheney, MD       Or  . ondansetron (ZOFRAN) injection 4 mg  4 mg Intravenous Q6H PRN Pahwani, Ravi, MD      . polyethylene glycol (MIRALAX / GLYCOLAX) packet 17 g  17 g Oral Daily PRN Pahwani, Einar Grad, MD      . simvastatin (ZOCOR) tablet 20 mg  20 mg Oral Daily Darliss Cheney, MD   20 mg at 09/19/19 [id number]     Discharge Medications: Please see discharge summary for a list of discharge medications.  Relevant Imaging Results:  Relevant Lab Results:   Additional Information SS#: [id number]  Benard Halsted, LCSW

## 2019-09-19 NOTE — TOC Transition Note (Signed)
Transition of Care [facility]) - CM/SW Discharge Note   Patient Details  Name: Bianca Wilson MRN: [id number] Date of Birth: Sep 09, 1930  Transition of Care [facility]) CM/SW Contact:  Benard Halsted, LCSW Phone Number: 09/19/2019, 10:37 AM   Clinical Narrative:    CSW spoke with patient's daughter. She confirmed that she was unable to locate home care to assist her so she has let [facility] know that the patient will be returning there. CSW will arrange PTAR. RN aware.    Final next level of care: [place] Barriers to Discharge: No Barriers Identified   Patient Goals and CMS Choice     Choice offered to / list presented to : Adult Children  Discharge Placement   Existing PASRR number confirmed : 09/19/19          Patient chooses bed at: [facility] Patient to be transferred to facility by: [place] Name of family member notified: Daughter, patricia Patient and family notified of of transfer: 09/19/19  Discharge Plan and Services                                     Social Determinants of Health (SDOH) Interventions     Readmission Risk Interventions No flowsheet data found.

## 2019-09-19 NOTE — TOC Transition Note (Addendum)
Transition of Care [facility]) - CM/SW Discharge Note   Patient Details  Name: Bianca Wilson MRN: [id number] Date of Birth: [birth year]  Transition of Care [facility]) CM/SW Contact:  Benard Halsted, [place] Phone Number: 09/19/2019, 10:55 AM   Clinical Narrative:    Patient will DC to: [place] Anticipated DC date: 09/19/19 Family notified: Daughter, [name] Transport by: PTAR 12:30pm   Per MD patient ready for DC to [place]. RN, patient, patient's family, and facility notified of DC. Discharge Summary and FL2 sent to facility. RN to call report prior to discharge [phone] Room 723). DC packet on chart. Ambulance transport requested for patient.   CSW will sign off for now as social work intervention is no longer needed. Please consult [place] again if new needs arise.  Cedric Fishman, LCSW Clinical Social Worker [phone]    Final next level of care: Skilled Nursing Facility Barriers to Discharge: No Barriers Identified   Patient Goals and CMS Choice     Choice offered to / list presented to : Adult Children  Discharge Placement   Existing PASRR number confirmed : 09/19/19          Patient chooses bed at: [facility] Patient to be transferred to facility by: [place] Name of family member notified: Daughter, patricia Patient and family notified of of transfer: 09/19/19  Discharge Plan and Services                                     Social Determinants of Health (SDOH) Interventions     Readmission Risk Interventions No flowsheet data found.

## 2019-09-19 NOTE — Plan of Care (Signed)

## 2019-09-19 NOTE — Plan of Care (Signed)
  Problem: Clinical Measurements: Goal: Ability to maintain clinical measurements within normal limits will improve 09/19/2019 1131 by Lacey Jensen, RN Outcome: Adequate for Discharge 09/19/2019 1131 by Lacey Jensen, RN Outcome: Progressing Goal: Will remain free from infection 09/19/2019 1131 by Lacey Jensen, RN Outcome: Adequate for Discharge 09/19/2019 1131 by Lacey Jensen, RN Outcome: Progressing Goal: Diagnostic test results will improve 09/19/2019 1131 by Lacey Jensen, RN Outcome: Adequate for Discharge 09/19/2019 1131 by Lacey Jensen, RN Outcome: Progressing Goal: Respiratory complications will improve 09/19/2019 1131 by Lacey Jensen, RN Outcome: Adequate for Discharge 09/19/2019 1131 by Lacey Jensen, RN Outcome: Progressing Goal: Cardiovascular complication will be avoided 09/19/2019 1131 by Lacey Jensen, RN Outcome: Adequate for Discharge 09/19/2019 1131 by Lacey Jensen, RN Outcome: Progressing

## 2019-09-19 NOTE — Progress Notes (Signed)
Physical Therapy Treatment Note   Patient more confused today, repeating herself, not always making sense. She continues to require two person physical assist for transfers using Stedy. CNA present to assist patient with breakfast at end of session. Chair alarm on.  Continued recommendation for short term rehabilitation at [facility].    09/19/19 0851  PT Visit Information  Last PT Received On 09/19/19  Assistance Needed +2  History of Present Illness 86 year old female admitted 09/15/19 with recent diagnosis of COVID 19 on 09/06/19 and 09/13/19. She was initially brought from her ALF to [facility] via EMS due to hypoxia and on 3L [place]. She desaturated to 70% with supplemental oxygen and was found to be increasingly confused, noncoherent, and has not had anything to eat or drink since Monday. She was started on nonrebreather upon arrival to [facility] ED. CXR showed bilat patchy airspace disease. Patient transferred to [facility].  Subjective Data  Subjective Patient more confused, not always making sense, repeating herself.  Precautions  Precautions Fall  Precaution Comments impaired cognition, bilat mitts  Restrictions  Weight Bearing Restrictions No  Pain Assessment  Pain Assessment Faces  Pain Score 0  Cognition  Arousal/Alertness Awake/alert  Overall Cognitive Status History of cognitive impairments - at baseline (more confused today than Friday's session)  Bed Mobility  Overal bed mobility Needs Assistance  Bed Mobility Supine to Sit  Supine to sit Max assist;HOB elevated;+2 for safety/equipment  Transfers  Overall transfer level Needs assistance  Transfer via Lift Equipment Stedy  Transfers Sit to/from Stand  Sit to Stand Max assist;+2 physical assistance;From elevated surface  General transfer comment Transfer bed>chair with Stedy and two assist for improved positioning for breakfast as patient sliding down in the bed.  Balance  Overall balance assessment Needs  assistance  Sitting-balance support Feet supported;Single extremity supported;Bilateral upper extremity supported  Sitting balance-Leahy Scale Poor  Postural control Left lateral lean  Standing balance support Bilateral upper extremity supported  Standing balance-Leahy Scale Zero  Standing balance comment Standing with Stedy with assist for brief moment.  General Comments  General comments (skin integrity, edema, etc.) Patient on room air today, tele monitoring has been discharged, bilat mitts on. Patient more confused, repeating herself and not always making sense. CNA present to assist patient with feeding for breakfast.    PT - Assessment/Plan  PT Plan Current plan remains appropriate  PT Visit Diagnosis Unsteadiness on feet (R26.81);Muscle weakness (generalized) (M62.81)  PT Frequency (ACUTE ONLY) Min 2X/week  Follow Up Recommendations SNF;Supervision/Assistance - 24 hour  AM-PAC PT "6 Clicks" Mobility Outcome Measure (Version 2)  Help needed turning from your back to your side while in a flat bed without using bedrails? 2  Help needed moving from lying on your back to sitting on the side of a flat bed without using bedrails? 2  Help needed moving to and from a bed to a chair (including a wheelchair)? 2  Help needed standing up from a chair using your arms (e.g., wheelchair or bedside chair)? 2  Help needed to walk in hospital room? 1  Help needed climbing 3-5 steps with a railing?  1  6 Click Score 10  Consider Recommendation of Discharge To: CIR/SNF/LTACH  PT Goal Progression  Progress towards PT goals Progressing toward goals  Acute Rehab PT Goals  PT Goal Formulation Patient unable to participate in goal setting  PT Time Calculation  PT Start Time (ACUTE ONLY) 0851  PT Stop Time (ACUTE ONLY) 0908  PT  Time Calculation (min) (ACUTE ONLY) 17 min  PT General Charges  $$ ACUTE PT VISIT 1 Visit  PT Treatments  $Therapeutic Activity 8-22 mins   Birdie Hopes, DPT, PT Acute  Rehab [phone] office

## 2019-09-19 NOTE — Discharge Instructions (Signed)
Follow with Primary MD Bonnita Nasuti, MD in 7 days   Get CBC, CMP, 2 view Chest X ray -  checked next visit within 1 week by Primary MD or SNF MD   Activity: As tolerated with Full fall precautions use walker/cane & assistance as needed  Disposition SNF  Diet: Soft diet with feeding assistance and aspiration precautions.  Special Instructions: If you have smoked or chewed Tobacco  in the last 2 yrs please stop smoking, stop any regular Alcohol  and or any Recreational drug use.  On your next visit with your primary care physician please Get Medicines reviewed and adjusted.  Please request your Prim.MD to go over all Hospital Tests and Procedure/Radiological results at the follow up, please get all Hospital records sent to your Prim MD by signing hospital release before you go home.  If you experience worsening of your admission symptoms, develop shortness of breath, life threatening emergency, suicidal or homicidal thoughts you must seek medical attention immediately by calling 911 or calling your MD immediately  if symptoms less severe.  You Must read complete instructions/literature along with all the possible adverse reactions/side effects for all the Medicines you take and that have been prescribed to you. Take any new Medicines after you have completely understood and accpet all the possible adverse reactions/side effects.

## 2019-09-19 NOTE — Progress Notes (Signed)
Discharge order placed after care team assessed and found pt to be adequate for discharge. Report called to Lesli Albee at [facility] at 11:30am, the SNF the patient will be transported back to. Pt PIV removed and vital signs stable prior to discharge. Pt skin found to be clean, dry and intact - no new skin issues aside from those noted on charting. PTAR arrived on unit to pick up patient at 1:15pm - all appropriate documents given to [facility] staff, including DNR documents. Pt transported off unit by PTAR to SNF.

## 2019-09-19 NOTE — Discharge Summary (Signed)
Bianca Wilson XLK:[id number] DOB: [birth year] DOA: 09/15/2019  PCP: Bonnita Nasuti, MD  Admit date: 09/15/2019  Discharge date: 09/19/2019  Admitted From: SNF   Disposition:  SNF   Recommendations for Outpatient Follow-up:   Follow up with PCP in 1-2 weeks  PCP Please obtain BMP/CBC, 2 view CXR in 1week,  (see Discharge instructions)   PCP Please follow up on the following pending results:    Home Health: None   Equipment/Devices:  None Consultations: None  Discharge Condition: Guarded CODE STATUS: DNR Diet Recommendation: Soft diet with feeding assistance and aspiration precautions    CC - SOB   Brief history of present illness from the day of admission and additional interim summary    Bianca L [name] a 86 y.o.femalewith medical history significant of, CKD stage III,Alzheimer dementia, hypertension, hyperlipidemia, type 2 diabetes mellitus, hypothyroidism and recent diagnosis of COVID-19 positive test twice, initially on 09/06/2019 and then 3-2-2021initiallybrought to [place] from [place] [facility], was diagnosed with acute hypoxic respiratory failure due to COVID-19 pneumonia and sent to [facility] for further treatment.                                                                 Hospital Course   Acute Hypoxic Resp. Failure due to Acute Covid 19 Viral Pneumonitis during the ongoing 2020 Covid 19 Pandemic - she appears to have had severe disease upon presentation requiring 15 L nasal cannula oxygen, she has been appropriately treated with IV steroids, remdesivir and Actemra upon arrival.  She has shown good improvement currently down to RA and appears to be improving.  Has finished her COVID-19 treatment will be discharged back to SNF.  Recent Labs  Lab 09/15/19 1700  09/16/19 0720 09/17/19 0437 09/18/19 0303 09/19/19 0311  CRP  --  17.7* 11.0* 6.7* 3.8*  DDIMER  --  1.54* 1.70* 1.02* 0.92*  FERRITIN 303  --   --   --   --   BNP  --  38.7 66.2 91.2 157.8*  PROCALCITON 0.40  --  0.25 0.20 0.11    Hepatic Function Latest Ref Rng & Units 09/19/2019 09/18/2019 09/17/2019  Total Protein 6.5 - 8.1 g/dL 4.9(L) 5.6(L) 6.0(L)  Albumin 3.5 - 5.0 g/dL 2.2(L) 2.4(L) 2.6(L)  AST 15 - 41 U/L 36 50(H) 38  ALT 0 - 44 U/L '23 25 20  ' Alk Phosphatase 38 - 126 U/L 67 69 68  Total Bilirubin 0.3 - 1.2 mg/dL 0.3 0.7 0.8    2.  Dehydration with hypernatremia and AKI.  Improved after hydration with D5W.  3.  Hypothyroidism.  On home dose Synthroid.  4.  Dyslipidemia.  Continue home dose statin.  5.  Advanced underlying Alzheimer's dementia, remains at risk for delirium, supportive care.  Minimize benzodiazepines and  narcotics.  6.  Essential hypertension.  Continue Norvasc.  7. DM2 - on SNF Rx, continue.   Discharge diagnosis     Active Problems:   Subdural hematoma (HCC)   HTN (hypertension)   Alzheimer's dementia (HCC)   HLD (hyperlipidemia)   Type 2 diabetes mellitus (HCC)   Hypothyroidism   Pneumonia due to COVID-19 virus   Acute respiratory failure with hypoxia [facility])    Discharge instructions    Discharge Instructions    Discharge instructions   Complete by: As directed    Follow with Primary MD Bonnita Nasuti, MD in 7 days   Get CBC, CMP, 2 view Chest X ray -  checked next visit within 1 week by Primary MD or SNF MD   Activity: As tolerated with Full fall precautions use walker/cane & assistance as needed  Disposition SNF  Diet: Soft diet with feeding assistance and aspiration precautions.  Special Instructions: If you have smoked or chewed Tobacco  in the last 2 yrs please stop smoking, stop any regular Alcohol  and or any Recreational drug use.  On your next visit with your primary care physician please Get Medicines reviewed and  adjusted.  Please request your Prim.MD to go over all Hospital Tests and Procedure/Radiological results at the follow up, please get all Hospital records sent to your Prim MD by signing hospital release before you go home.  If you experience worsening of your admission symptoms, develop shortness of breath, life threatening emergency, suicidal or homicidal thoughts you must seek medical attention immediately by calling 911 or calling your MD immediately  if symptoms less severe.  You Must read complete instructions/literature along with all the possible adverse reactions/side effects for all the Medicines you take and that have been prescribed to you. Take any new Medicines after you have completely understood and accpet all the possible adverse reactions/side effects.   Increase activity slowly   Complete by: As directed    MyChart COVID-19 home monitoring program   Complete by: Sep 19, 2019    Is the patient willing to use the [place] for home monitoring?: Yes   Temperature monitoring   Complete by: Sep 19, 2019    After how many days would you like to receive a notification of this patient's flowsheet entries?: 1      Discharge Medications   Allergies as of 09/19/2019      Reactions   Benadryl [diphenhydramine]    Per MAR   Hydrocodone    Per [facility] 08/04/18 Family reports sensitive to narcotics (confusion/delirium)   Other Other (See Comments)   Muscles Relaxers ,Narcotics  08/04/18 Family reports sensitive to narcotics (confusion/delirium)      Medication List    STOP taking these medications   diphenhydrAMINE 25 mg capsule Commonly known as: BENADRYL   insulin aspart 100 UNIT/ML injection Commonly known as: novoLOG   levETIRAcetam 500 MG tablet Commonly known as: Keppra     TAKE these medications   acetaminophen 325 MG tablet Commonly known as: TYLENOL Take 2 tablets (650 mg total) by mouth every 4 (four) hours as needed for fever, headache or mild  pain. What changed: when to take this   alendronate 70 MG tablet Commonly known as: FOSAMAX Take 70 mg by mouth every Sunday. Take with a full glass of water on an empty stomach.   amLODipine 10 MG tablet Commonly known as: NORVASC Take 10 mg by mouth daily.   dexamethasone 6 MG tablet Commonly known  as: DECADRON Take 6 mg by mouth daily.   divalproex 250 MG DR tablet Commonly known as: DEPAKOTE Take 500 mg by mouth at bedtime.   donepezil 10 MG tablet Commonly known as: ARICEPT Take 10 mg by mouth at bedtime.   levothyroxine 50 MCG tablet Commonly known as: SYNTHROID Take 50 mcg by mouth daily before breakfast.   Magnesium Oxide 250 MG Tabs Take 1 tablet by mouth daily.   Melatonin 3 MG Tabs Take 1 tablet by mouth at bedtime.   memantine 10 MG tablet Commonly known as: NAMENDA Take 10 mg by mouth 2 (two) times daily.   metFORMIN 500 MG 24 hr tablet Commonly known as: GLUCOPHAGE-XR Take 500 mg by mouth daily with breakfast.   simvastatin 20 MG tablet Commonly known as: ZOCOR Take 20 mg by mouth daily.       Follow-up Information    Hague, Rosalyn Charters, MD. Schedule an appointment as soon as possible for a visit in 1 week(s).   Specialty: Internal Medicine Contact information: [address] [place] [address] [phone]           Major procedures and Radiology Reports - PLEASE review detailed and final reports thoroughly  -        No results found.  Micro Results    No results found for this or any previous visit (from the past 240 hour(s)).  Today   Subjective    Bianca Wilson today in bed appears to be in no distress, remains quite confused which is her baseline.  Denies any headache or chest pain.   Objective   Blood pressure 140/90, pulse 63, temperature (!) 97.5 F (36.4 C), temperature source Oral, resp. rate 16, height '5\' 2"'  (1.575 m), weight 61.5 kg, SpO2 93 %.   Intake/Output Summary (Last 24 hours) at 09/19/2019  0953 Last data filed at 09/19/2019 0300 Gross per 24 hour  Intake 540 ml  Output --  Net 540 ml    Exam  Awake but pleasantly confused, moving all 4 extremities by herself, in no distress [place].AT,PERRAL Supple Neck,No JVD, No cervical lymphadenopathy appriciated.  Symmetrical Chest wall movement, Good air movement bilaterally, CTAB RRR,No Gallops,Rubs or new Murmurs, No Parasternal Heave +ve B.Sounds, Abd Soft, Non tender, No organomegaly appriciated, No rebound -guarding or rigidity. No Cyanosis, Clubbing or edema, No new Rash or bruise   Data Review   CBC w Diff:  Lab Results  Component Value Date   WBC 7.4 09/19/2019   HGB 11.3 (L) 09/19/2019   HCT 34.3 (L) 09/19/2019   PLT 239 09/19/2019   LYMPHOPCT 7 09/19/2019   MONOPCT 3 09/19/2019   EOSPCT 0 09/19/2019   BASOPCT 0 09/19/2019    CMP:  Lab Results  Component Value Date   NA 142 09/19/2019   K 4.1 09/19/2019   CL 104 09/19/2019   CO2 25 09/19/2019   BUN 65 (H) 09/19/2019   CREATININE 1.24 (H) 09/19/2019   PROT 4.9 (L) 09/19/2019   ALBUMIN 2.2 (L) 09/19/2019   BILITOT 0.3 09/19/2019   ALKPHOS 67 09/19/2019   AST 36 09/19/2019   ALT 23 09/19/2019  .   Total Time in preparing paper work, data evaluation and todays exam - 13 minutes  Lala Lund M.D on 09/19/2019 at 9:53 AM  [facility]   Office  [phone]

## 2019-09-21 DIAGNOSIS — E870 Hyperosmolality and hypernatremia: Secondary | ICD-10-CM | POA: Diagnosis not present

## 2019-09-21 DIAGNOSIS — B9729 Other coronavirus as the cause of diseases classified elsewhere: Secondary | ICD-10-CM | POA: Diagnosis not present

## 2019-09-21 DIAGNOSIS — J1282 Pneumonia due to coronavirus disease 2019: Secondary | ICD-10-CM | POA: Diagnosis not present

## 2019-09-21 DIAGNOSIS — E860 Dehydration: Secondary | ICD-10-CM | POA: Diagnosis not present

## 2019-09-22 DIAGNOSIS — D649 Anemia, unspecified: Secondary | ICD-10-CM | POA: Diagnosis not present

## 2019-09-22 DIAGNOSIS — I10 Essential (primary) hypertension: Secondary | ICD-10-CM | POA: Diagnosis not present

## 2019-09-22 DIAGNOSIS — E870 Hyperosmolality and hypernatremia: Secondary | ICD-10-CM | POA: Diagnosis not present

## 2019-09-22 DIAGNOSIS — E878 Other disorders of electrolyte and fluid balance, not elsewhere classified: Secondary | ICD-10-CM | POA: Diagnosis not present

## 2019-09-22 NOTE — Progress Notes (Signed)
   09/19/19 1000  SLP Visit Information  SLP Received On 09/19/19  General Information  Behavior/Cognition Confused  Patient Positioning Postural control interferes with function  HPI 86 y.o. female with medical history significant of, CKD stage III ,Alzheimer dementia, hypertension, hyperlipidemia, type 2 diabetes mellitus, hypothyroidism and recent diagnosis of COVID-19 positive test twice, initially on 09/06/2019 and then 09-13-2019 initially brought to [facility] via EMS from [place] assisted living, was diagnosed with acute hypoxic respiratory failure due to COVID-19 pneumonia and sent to [facility] for further treatment. Pt was seen by SLP services during an admission for a fall in Dec 2019 - at that time, she had a chronic dysphagia and was D/Cd on a dysphagia 1 diet with nectar thick liquids.   Temperature Spikes Noted No  Oral Cavity - Dentition Dentures, top  Patient observed directly with PO's Yes  Type of PO's observed Thin liquids  Feeding Total assist  Liquids provided via Straw  Treatment Provided  Treatment provided Dysphagia  Dysphagia Treatment  Treatment Methods Skilled observation.   Pharyngeal Phase Signs & Symptoms Immediate cough  Type of cueing Verbal  Amount of cueing Minimal  SLP - End of Session  Patient left in chair  Nurse Communication Treatment plan  Assessment / Recommendations / Plan  Plan All goals met  Dysphagia Recommendations  Diet recommendations Dysphagia 1 (puree);Thin liquid  Liquids provided via Straw  Medication Administration Crushed with puree  Supervision Patient able to self feed  Compensations Slow rate;Small sips/bites  Postural Changes and/or Swallow Maneuvers Seated upright 90 degrees  General Recommendations  Oral Care Recommendations Oral care BID

## 2019-09-23 DIAGNOSIS — E785 Hyperlipidemia, unspecified: Secondary | ICD-10-CM | POA: Diagnosis not present

## 2019-09-23 DIAGNOSIS — L299 Pruritus, unspecified: Secondary | ICD-10-CM | POA: Diagnosis not present

## 2019-09-23 DIAGNOSIS — R262 Difficulty in walking, not elsewhere classified: Secondary | ICD-10-CM | POA: Diagnosis not present

## 2019-09-23 DIAGNOSIS — M1990 Unspecified osteoarthritis, unspecified site: Secondary | ICD-10-CM | POA: Diagnosis not present

## 2019-09-23 DIAGNOSIS — R293 Abnormal posture: Secondary | ICD-10-CM | POA: Diagnosis not present

## 2019-09-23 DIAGNOSIS — G301 Alzheimer's disease with late onset: Secondary | ICD-10-CM | POA: Diagnosis not present

## 2019-09-23 DIAGNOSIS — E860 Dehydration: Secondary | ICD-10-CM | POA: Diagnosis not present

## 2019-09-23 DIAGNOSIS — U071 COVID-19: Secondary | ICD-10-CM | POA: Diagnosis not present

## 2019-09-23 DIAGNOSIS — F0281 Dementia in other diseases classified elsewhere with behavioral disturbance: Secondary | ICD-10-CM | POA: Diagnosis not present

## 2019-09-23 DIAGNOSIS — E875 Hyperkalemia: Secondary | ICD-10-CM | POA: Diagnosis not present

## 2019-09-23 DIAGNOSIS — R278 Other lack of coordination: Secondary | ICD-10-CM | POA: Diagnosis not present

## 2019-09-23 DIAGNOSIS — R41841 Cognitive communication deficit: Secondary | ICD-10-CM | POA: Diagnosis not present

## 2019-09-23 DIAGNOSIS — E870 Hyperosmolality and hypernatremia: Secondary | ICD-10-CM | POA: Diagnosis not present

## 2019-09-23 DIAGNOSIS — D649 Anemia, unspecified: Secondary | ICD-10-CM | POA: Diagnosis not present

## 2019-09-23 DIAGNOSIS — M6281 Muscle weakness (generalized): Secondary | ICD-10-CM | POA: Diagnosis not present

## 2019-09-23 DIAGNOSIS — M810 Age-related osteoporosis without current pathological fracture: Secondary | ICD-10-CM | POA: Diagnosis not present

## 2019-09-23 DIAGNOSIS — E119 Type 2 diabetes mellitus without complications: Secondary | ICD-10-CM | POA: Diagnosis not present

## 2019-09-23 DIAGNOSIS — E1149 Type 2 diabetes mellitus with other diabetic neurological complication: Secondary | ICD-10-CM | POA: Diagnosis not present

## 2019-09-23 DIAGNOSIS — J1282 Pneumonia due to coronavirus disease 2019: Secondary | ICD-10-CM | POA: Diagnosis not present

## 2019-09-23 DIAGNOSIS — G309 Alzheimer's disease, unspecified: Secondary | ICD-10-CM | POA: Diagnosis not present

## 2019-09-23 DIAGNOSIS — Z741 Need for assistance with personal care: Secondary | ICD-10-CM | POA: Diagnosis not present

## 2019-09-23 DIAGNOSIS — J9621 Acute and chronic respiratory failure with hypoxia: Secondary | ICD-10-CM | POA: Diagnosis not present

## 2019-09-23 DIAGNOSIS — R569 Unspecified convulsions: Secondary | ICD-10-CM | POA: Diagnosis not present

## 2019-09-23 DIAGNOSIS — I2510 Atherosclerotic heart disease of native coronary artery without angina pectoris: Secondary | ICD-10-CM | POA: Diagnosis not present

## 2019-09-23 DIAGNOSIS — J129 Viral pneumonia, unspecified: Secondary | ICD-10-CM | POA: Diagnosis not present

## 2019-09-23 DIAGNOSIS — R1312 Dysphagia, oropharyngeal phase: Secondary | ICD-10-CM | POA: Diagnosis not present

## 2019-09-23 DIAGNOSIS — R627 Adult failure to thrive: Secondary | ICD-10-CM | POA: Diagnosis not present

## 2019-09-23 DIAGNOSIS — R296 Repeated falls: Secondary | ICD-10-CM | POA: Diagnosis not present

## 2019-09-23 DIAGNOSIS — E039 Hypothyroidism, unspecified: Secondary | ICD-10-CM | POA: Diagnosis not present

## 2019-09-23 DIAGNOSIS — I10 Essential (primary) hypertension: Secondary | ICD-10-CM | POA: Diagnosis not present

## 2019-09-23 DIAGNOSIS — R197 Diarrhea, unspecified: Secondary | ICD-10-CM | POA: Diagnosis not present

## 2019-09-23 DIAGNOSIS — I69054 Hemiplegia and hemiparesis following nontraumatic subarachnoid hemorrhage affecting left non-dominant side: Secondary | ICD-10-CM | POA: Diagnosis not present

## 2019-09-24 DIAGNOSIS — I10 Essential (primary) hypertension: Secondary | ICD-10-CM | POA: Diagnosis not present

## 2019-09-24 DIAGNOSIS — M1990 Unspecified osteoarthritis, unspecified site: Secondary | ICD-10-CM | POA: Diagnosis not present

## 2019-09-24 DIAGNOSIS — E1149 Type 2 diabetes mellitus with other diabetic neurological complication: Secondary | ICD-10-CM | POA: Diagnosis not present

## 2019-09-24 DIAGNOSIS — I2510 Atherosclerotic heart disease of native coronary artery without angina pectoris: Secondary | ICD-10-CM | POA: Diagnosis not present

## 2019-10-05 DIAGNOSIS — G301 Alzheimer's disease with late onset: Secondary | ICD-10-CM | POA: Diagnosis not present

## 2019-10-05 DIAGNOSIS — E860 Dehydration: Secondary | ICD-10-CM | POA: Diagnosis not present

## 2019-10-05 DIAGNOSIS — E870 Hyperosmolality and hypernatremia: Secondary | ICD-10-CM | POA: Diagnosis not present

## 2019-10-05 DIAGNOSIS — R627 Adult failure to thrive: Secondary | ICD-10-CM | POA: Diagnosis not present

## 2019-10-19 DIAGNOSIS — R569 Unspecified convulsions: Secondary | ICD-10-CM | POA: Diagnosis not present

## 2019-10-19 DIAGNOSIS — E119 Type 2 diabetes mellitus without complications: Secondary | ICD-10-CM | POA: Diagnosis not present

## 2019-10-19 DIAGNOSIS — I69054 Hemiplegia and hemiparesis following nontraumatic subarachnoid hemorrhage affecting left non-dominant side: Secondary | ICD-10-CM | POA: Diagnosis not present

## 2019-10-19 DIAGNOSIS — I119 Hypertensive heart disease without heart failure: Secondary | ICD-10-CM | POA: Diagnosis not present

## 2019-10-24 DIAGNOSIS — Z03818 Encounter for observation for suspected exposure to other biological agents ruled out: Secondary | ICD-10-CM | POA: Diagnosis not present

## 2019-10-29 DIAGNOSIS — I2510 Atherosclerotic heart disease of native coronary artery without angina pectoris: Secondary | ICD-10-CM | POA: Diagnosis not present

## 2019-10-29 DIAGNOSIS — M1990 Unspecified osteoarthritis, unspecified site: Secondary | ICD-10-CM | POA: Diagnosis not present

## 2019-10-29 DIAGNOSIS — E1149 Type 2 diabetes mellitus with other diabetic neurological complication: Secondary | ICD-10-CM | POA: Diagnosis not present

## 2019-10-29 DIAGNOSIS — I10 Essential (primary) hypertension: Secondary | ICD-10-CM | POA: Diagnosis not present

## 2019-10-31 DIAGNOSIS — Z03818 Encounter for observation for suspected exposure to other biological agents ruled out: Secondary | ICD-10-CM | POA: Diagnosis not present

## 2019-11-01 DIAGNOSIS — E1151 Type 2 diabetes mellitus with diabetic peripheral angiopathy without gangrene: Secondary | ICD-10-CM | POA: Diagnosis not present

## 2019-11-01 DIAGNOSIS — L603 Nail dystrophy: Secondary | ICD-10-CM | POA: Diagnosis not present

## 2019-11-01 DIAGNOSIS — M2141 Flat foot [pes planus] (acquired), right foot: Secondary | ICD-10-CM | POA: Diagnosis not present

## 2019-11-01 DIAGNOSIS — B351 Tinea unguium: Secondary | ICD-10-CM | POA: Diagnosis not present

## 2019-11-01 DIAGNOSIS — Z7984 Long term (current) use of oral hypoglycemic drugs: Secondary | ICD-10-CM | POA: Diagnosis not present

## 2019-11-01 DIAGNOSIS — E1140 Type 2 diabetes mellitus with diabetic neuropathy, unspecified: Secondary | ICD-10-CM | POA: Diagnosis not present

## 2019-11-01 DIAGNOSIS — M2142 Flat foot [pes planus] (acquired), left foot: Secondary | ICD-10-CM | POA: Diagnosis not present

## 2019-11-07 DIAGNOSIS — Z03818 Encounter for observation for suspected exposure to other biological agents ruled out: Secondary | ICD-10-CM | POA: Diagnosis not present

## 2019-11-14 DIAGNOSIS — Z03818 Encounter for observation for suspected exposure to other biological agents ruled out: Secondary | ICD-10-CM | POA: Diagnosis not present

## 2019-11-16 DIAGNOSIS — I119 Hypertensive heart disease without heart failure: Secondary | ICD-10-CM | POA: Diagnosis not present

## 2019-11-16 DIAGNOSIS — I69054 Hemiplegia and hemiparesis following nontraumatic subarachnoid hemorrhage affecting left non-dominant side: Secondary | ICD-10-CM | POA: Diagnosis not present

## 2019-11-16 DIAGNOSIS — G301 Alzheimer's disease with late onset: Secondary | ICD-10-CM | POA: Diagnosis not present

## 2019-11-16 DIAGNOSIS — E119 Type 2 diabetes mellitus without complications: Secondary | ICD-10-CM | POA: Diagnosis not present

## 2019-11-17 DIAGNOSIS — E039 Hypothyroidism, unspecified: Secondary | ICD-10-CM | POA: Diagnosis not present

## 2019-11-17 DIAGNOSIS — R569 Unspecified convulsions: Secondary | ICD-10-CM | POA: Diagnosis not present

## 2019-11-17 DIAGNOSIS — E119 Type 2 diabetes mellitus without complications: Secondary | ICD-10-CM | POA: Diagnosis not present

## 2019-11-17 DIAGNOSIS — I119 Hypertensive heart disease without heart failure: Secondary | ICD-10-CM | POA: Diagnosis not present

## 2019-11-17 DIAGNOSIS — Z79899 Other long term (current) drug therapy: Secondary | ICD-10-CM | POA: Diagnosis not present

## 2019-11-17 DIAGNOSIS — D649 Anemia, unspecified: Secondary | ICD-10-CM | POA: Diagnosis not present

## 2019-11-17 DIAGNOSIS — G309 Alzheimer's disease, unspecified: Secondary | ICD-10-CM | POA: Diagnosis not present

## 2019-11-21 DIAGNOSIS — Z03818 Encounter for observation for suspected exposure to other biological agents ruled out: Secondary | ICD-10-CM | POA: Diagnosis not present

## 2019-11-25 DIAGNOSIS — R319 Hematuria, unspecified: Secondary | ICD-10-CM | POA: Diagnosis not present

## 2019-11-25 DIAGNOSIS — R82998 Other abnormal findings in urine: Secondary | ICD-10-CM | POA: Diagnosis not present

## 2019-11-30 DIAGNOSIS — Z03818 Encounter for observation for suspected exposure to other biological agents ruled out: Secondary | ICD-10-CM | POA: Diagnosis not present

## 2019-12-19 DIAGNOSIS — M810 Age-related osteoporosis without current pathological fracture: Secondary | ICD-10-CM | POA: Diagnosis not present

## 2019-12-19 DIAGNOSIS — E785 Hyperlipidemia, unspecified: Secondary | ICD-10-CM | POA: Diagnosis not present

## 2019-12-19 DIAGNOSIS — E039 Hypothyroidism, unspecified: Secondary | ICD-10-CM | POA: Diagnosis not present

## 2019-12-19 DIAGNOSIS — R569 Unspecified convulsions: Secondary | ICD-10-CM | POA: Diagnosis not present

## 2019-12-20 DIAGNOSIS — Z03818 Encounter for observation for suspected exposure to other biological agents ruled out: Secondary | ICD-10-CM | POA: Diagnosis not present

## 2019-12-23 DIAGNOSIS — I10 Essential (primary) hypertension: Secondary | ICD-10-CM | POA: Diagnosis not present

## 2019-12-23 DIAGNOSIS — E1149 Type 2 diabetes mellitus with other diabetic neurological complication: Secondary | ICD-10-CM | POA: Diagnosis not present

## 2019-12-23 DIAGNOSIS — M1990 Unspecified osteoarthritis, unspecified site: Secondary | ICD-10-CM | POA: Diagnosis not present

## 2019-12-23 DIAGNOSIS — I2510 Atherosclerotic heart disease of native coronary artery without angina pectoris: Secondary | ICD-10-CM | POA: Diagnosis not present

## 2020-01-12 DIAGNOSIS — R41841 Cognitive communication deficit: Secondary | ICD-10-CM | POA: Diagnosis not present

## 2020-01-12 DIAGNOSIS — J1282 Pneumonia due to coronavirus disease 2019: Secondary | ICD-10-CM | POA: Diagnosis not present

## 2020-01-12 DIAGNOSIS — E785 Hyperlipidemia, unspecified: Secondary | ICD-10-CM | POA: Diagnosis not present

## 2020-01-12 DIAGNOSIS — L299 Pruritus, unspecified: Secondary | ICD-10-CM | POA: Diagnosis not present

## 2020-01-12 DIAGNOSIS — R569 Unspecified convulsions: Secondary | ICD-10-CM | POA: Diagnosis not present

## 2020-01-12 DIAGNOSIS — R197 Diarrhea, unspecified: Secondary | ICD-10-CM | POA: Diagnosis not present

## 2020-01-12 DIAGNOSIS — E039 Hypothyroidism, unspecified: Secondary | ICD-10-CM | POA: Diagnosis not present

## 2020-01-12 DIAGNOSIS — Z741 Need for assistance with personal care: Secondary | ICD-10-CM | POA: Diagnosis not present

## 2020-01-12 DIAGNOSIS — R262 Difficulty in walking, not elsewhere classified: Secondary | ICD-10-CM | POA: Diagnosis not present

## 2020-01-12 DIAGNOSIS — J129 Viral pneumonia, unspecified: Secondary | ICD-10-CM | POA: Diagnosis not present

## 2020-01-12 DIAGNOSIS — I69054 Hemiplegia and hemiparesis following nontraumatic subarachnoid hemorrhage affecting left non-dominant side: Secondary | ICD-10-CM | POA: Diagnosis not present

## 2020-01-12 DIAGNOSIS — I10 Essential (primary) hypertension: Secondary | ICD-10-CM | POA: Diagnosis not present

## 2020-01-12 DIAGNOSIS — E119 Type 2 diabetes mellitus without complications: Secondary | ICD-10-CM | POA: Diagnosis not present

## 2020-01-12 DIAGNOSIS — R627 Adult failure to thrive: Secondary | ICD-10-CM | POA: Diagnosis not present

## 2020-01-12 DIAGNOSIS — I119 Hypertensive heart disease without heart failure: Secondary | ICD-10-CM | POA: Diagnosis not present

## 2020-01-12 DIAGNOSIS — U071 COVID-19: Secondary | ICD-10-CM | POA: Diagnosis not present

## 2020-01-12 DIAGNOSIS — J9621 Acute and chronic respiratory failure with hypoxia: Secondary | ICD-10-CM | POA: Diagnosis not present

## 2020-01-12 DIAGNOSIS — R1312 Dysphagia, oropharyngeal phase: Secondary | ICD-10-CM | POA: Diagnosis not present

## 2020-01-12 DIAGNOSIS — R278 Other lack of coordination: Secondary | ICD-10-CM | POA: Diagnosis not present

## 2020-01-12 DIAGNOSIS — R293 Abnormal posture: Secondary | ICD-10-CM | POA: Diagnosis not present

## 2020-01-12 DIAGNOSIS — M810 Age-related osteoporosis without current pathological fracture: Secondary | ICD-10-CM | POA: Diagnosis not present

## 2020-01-12 DIAGNOSIS — M6281 Muscle weakness (generalized): Secondary | ICD-10-CM | POA: Diagnosis not present

## 2020-01-12 DIAGNOSIS — R296 Repeated falls: Secondary | ICD-10-CM | POA: Diagnosis not present

## 2020-01-12 DIAGNOSIS — G309 Alzheimer's disease, unspecified: Secondary | ICD-10-CM | POA: Diagnosis not present

## 2020-01-16 DIAGNOSIS — M810 Age-related osteoporosis without current pathological fracture: Secondary | ICD-10-CM | POA: Diagnosis not present

## 2020-01-16 DIAGNOSIS — E1149 Type 2 diabetes mellitus with other diabetic neurological complication: Secondary | ICD-10-CM | POA: Diagnosis not present

## 2020-01-16 DIAGNOSIS — M1990 Unspecified osteoarthritis, unspecified site: Secondary | ICD-10-CM | POA: Diagnosis not present

## 2020-01-16 DIAGNOSIS — I2510 Atherosclerotic heart disease of native coronary artery without angina pectoris: Secondary | ICD-10-CM | POA: Diagnosis not present

## 2020-01-17 DIAGNOSIS — M810 Age-related osteoporosis without current pathological fracture: Secondary | ICD-10-CM | POA: Diagnosis not present

## 2020-01-17 DIAGNOSIS — G309 Alzheimer's disease, unspecified: Secondary | ICD-10-CM | POA: Diagnosis not present

## 2020-01-17 DIAGNOSIS — R627 Adult failure to thrive: Secondary | ICD-10-CM | POA: Diagnosis not present

## 2020-01-17 DIAGNOSIS — R569 Unspecified convulsions: Secondary | ICD-10-CM | POA: Diagnosis not present

## 2020-01-17 DIAGNOSIS — I119 Hypertensive heart disease without heart failure: Secondary | ICD-10-CM | POA: Diagnosis not present

## 2020-01-17 DIAGNOSIS — E785 Hyperlipidemia, unspecified: Secondary | ICD-10-CM | POA: Diagnosis not present

## 2020-01-18 DIAGNOSIS — G309 Alzheimer's disease, unspecified: Secondary | ICD-10-CM | POA: Diagnosis not present

## 2020-01-18 DIAGNOSIS — R569 Unspecified convulsions: Secondary | ICD-10-CM | POA: Diagnosis not present

## 2020-01-18 DIAGNOSIS — M810 Age-related osteoporosis without current pathological fracture: Secondary | ICD-10-CM | POA: Diagnosis not present

## 2020-01-18 DIAGNOSIS — I119 Hypertensive heart disease without heart failure: Secondary | ICD-10-CM | POA: Diagnosis not present

## 2020-01-18 DIAGNOSIS — E785 Hyperlipidemia, unspecified: Secondary | ICD-10-CM | POA: Diagnosis not present

## 2020-01-18 DIAGNOSIS — R627 Adult failure to thrive: Secondary | ICD-10-CM | POA: Diagnosis not present

## 2020-01-19 DIAGNOSIS — E785 Hyperlipidemia, unspecified: Secondary | ICD-10-CM | POA: Diagnosis not present

## 2020-01-19 DIAGNOSIS — I119 Hypertensive heart disease without heart failure: Secondary | ICD-10-CM | POA: Diagnosis not present

## 2020-01-19 DIAGNOSIS — R627 Adult failure to thrive: Secondary | ICD-10-CM | POA: Diagnosis not present

## 2020-01-19 DIAGNOSIS — R569 Unspecified convulsions: Secondary | ICD-10-CM | POA: Diagnosis not present

## 2020-01-19 DIAGNOSIS — G309 Alzheimer's disease, unspecified: Secondary | ICD-10-CM | POA: Diagnosis not present

## 2020-01-19 DIAGNOSIS — M810 Age-related osteoporosis without current pathological fracture: Secondary | ICD-10-CM | POA: Diagnosis not present

## 2020-01-22 DIAGNOSIS — E785 Hyperlipidemia, unspecified: Secondary | ICD-10-CM | POA: Diagnosis not present

## 2020-01-22 DIAGNOSIS — R569 Unspecified convulsions: Secondary | ICD-10-CM | POA: Diagnosis not present

## 2020-01-22 DIAGNOSIS — I119 Hypertensive heart disease without heart failure: Secondary | ICD-10-CM | POA: Diagnosis not present

## 2020-01-22 DIAGNOSIS — G309 Alzheimer's disease, unspecified: Secondary | ICD-10-CM | POA: Diagnosis not present

## 2020-01-22 DIAGNOSIS — R627 Adult failure to thrive: Secondary | ICD-10-CM | POA: Diagnosis not present

## 2020-01-22 DIAGNOSIS — M810 Age-related osteoporosis without current pathological fracture: Secondary | ICD-10-CM | POA: Diagnosis not present

## 2020-01-23 DIAGNOSIS — R569 Unspecified convulsions: Secondary | ICD-10-CM | POA: Diagnosis not present

## 2020-01-23 DIAGNOSIS — E785 Hyperlipidemia, unspecified: Secondary | ICD-10-CM | POA: Diagnosis not present

## 2020-01-23 DIAGNOSIS — I119 Hypertensive heart disease without heart failure: Secondary | ICD-10-CM | POA: Diagnosis not present

## 2020-01-23 DIAGNOSIS — R627 Adult failure to thrive: Secondary | ICD-10-CM | POA: Diagnosis not present

## 2020-01-23 DIAGNOSIS — M810 Age-related osteoporosis without current pathological fracture: Secondary | ICD-10-CM | POA: Diagnosis not present

## 2020-01-23 DIAGNOSIS — G309 Alzheimer's disease, unspecified: Secondary | ICD-10-CM | POA: Diagnosis not present

## 2020-02-08 DIAGNOSIS — Z03818 Encounter for observation for suspected exposure to other biological agents ruled out: Secondary | ICD-10-CM | POA: Diagnosis not present

## 2020-02-13 DIAGNOSIS — Z03818 Encounter for observation for suspected exposure to other biological agents ruled out: Secondary | ICD-10-CM | POA: Diagnosis not present

## 2020-02-14 DIAGNOSIS — F329 Major depressive disorder, single episode, unspecified: Secondary | ICD-10-CM | POA: Diagnosis present

## 2020-02-14 DIAGNOSIS — R532 Functional quadriplegia: Secondary | ICD-10-CM | POA: Diagnosis not present

## 2020-02-14 DIAGNOSIS — M810 Age-related osteoporosis without current pathological fracture: Secondary | ICD-10-CM | POA: Diagnosis not present

## 2020-02-14 DIAGNOSIS — J129 Viral pneumonia, unspecified: Secondary | ICD-10-CM | POA: Diagnosis not present

## 2020-02-14 DIAGNOSIS — J9621 Acute and chronic respiratory failure with hypoxia: Secondary | ICD-10-CM | POA: Diagnosis not present

## 2020-02-14 DIAGNOSIS — E785 Hyperlipidemia, unspecified: Secondary | ICD-10-CM | POA: Diagnosis present

## 2020-02-14 DIAGNOSIS — R296 Repeated falls: Secondary | ICD-10-CM | POA: Diagnosis not present

## 2020-02-14 DIAGNOSIS — Z9049 Acquired absence of other specified parts of digestive tract: Secondary | ICD-10-CM | POA: Diagnosis not present

## 2020-02-14 DIAGNOSIS — G309 Alzheimer's disease, unspecified: Secondary | ICD-10-CM | POA: Diagnosis not present

## 2020-02-14 DIAGNOSIS — Z8673 Personal history of transient ischemic attack (TIA), and cerebral infarction without residual deficits: Secondary | ICD-10-CM | POA: Diagnosis not present

## 2020-02-14 DIAGNOSIS — Z888 Allergy status to other drugs, medicaments and biological substances status: Secondary | ICD-10-CM | POA: Diagnosis not present

## 2020-02-14 DIAGNOSIS — Z7401 Bed confinement status: Secondary | ICD-10-CM | POA: Diagnosis not present

## 2020-02-14 DIAGNOSIS — E039 Hypothyroidism, unspecified: Secondary | ICD-10-CM | POA: Diagnosis present

## 2020-02-14 DIAGNOSIS — Z79899 Other long term (current) drug therapy: Secondary | ICD-10-CM | POA: Diagnosis not present

## 2020-02-14 DIAGNOSIS — E119 Type 2 diabetes mellitus without complications: Secondary | ICD-10-CM | POA: Diagnosis present

## 2020-02-14 DIAGNOSIS — J1282 Pneumonia due to coronavirus disease 2019: Secondary | ICD-10-CM | POA: Diagnosis not present

## 2020-02-14 DIAGNOSIS — E43 Unspecified severe protein-calorie malnutrition: Secondary | ICD-10-CM | POA: Diagnosis present

## 2020-02-14 DIAGNOSIS — Z6824 Body mass index (BMI) 24.0-24.9, adult: Secondary | ICD-10-CM | POA: Diagnosis not present

## 2020-02-14 DIAGNOSIS — D61818 Other pancytopenia: Secondary | ICD-10-CM | POA: Diagnosis not present

## 2020-02-14 DIAGNOSIS — F419 Anxiety disorder, unspecified: Secondary | ICD-10-CM | POA: Diagnosis present

## 2020-02-14 DIAGNOSIS — Z90710 Acquired absence of both cervix and uterus: Secondary | ICD-10-CM | POA: Diagnosis not present

## 2020-02-14 DIAGNOSIS — J9600 Acute respiratory failure, unspecified whether with hypoxia or hypercapnia: Secondary | ICD-10-CM | POA: Diagnosis not present

## 2020-02-14 DIAGNOSIS — R0689 Other abnormalities of breathing: Secondary | ICD-10-CM | POA: Diagnosis not present

## 2020-02-14 DIAGNOSIS — I10 Essential (primary) hypertension: Secondary | ICD-10-CM | POA: Diagnosis present

## 2020-02-14 DIAGNOSIS — M2550 Pain in unspecified joint: Secondary | ICD-10-CM | POA: Diagnosis not present

## 2020-02-14 DIAGNOSIS — M6281 Muscle weakness (generalized): Secondary | ICD-10-CM | POA: Diagnosis not present

## 2020-02-14 DIAGNOSIS — N390 Urinary tract infection, site not specified: Secondary | ICD-10-CM | POA: Diagnosis not present

## 2020-02-14 DIAGNOSIS — U071 COVID-19: Secondary | ICD-10-CM | POA: Diagnosis not present

## 2020-02-14 DIAGNOSIS — R278 Other lack of coordination: Secondary | ICD-10-CM | POA: Diagnosis not present

## 2020-02-14 DIAGNOSIS — R7989 Other specified abnormal findings of blood chemistry: Secondary | ICD-10-CM | POA: Diagnosis not present

## 2020-02-14 DIAGNOSIS — R410 Disorientation, unspecified: Secondary | ICD-10-CM | POA: Diagnosis not present

## 2020-02-14 DIAGNOSIS — R627 Adult failure to thrive: Secondary | ICD-10-CM | POA: Diagnosis not present

## 2020-02-14 DIAGNOSIS — G9341 Metabolic encephalopathy: Secondary | ICD-10-CM | POA: Diagnosis present

## 2020-02-14 DIAGNOSIS — R509 Fever, unspecified: Secondary | ICD-10-CM | POA: Diagnosis not present

## 2020-02-14 DIAGNOSIS — I119 Hypertensive heart disease without heart failure: Secondary | ICD-10-CM | POA: Diagnosis not present

## 2020-02-14 DIAGNOSIS — Z8616 Personal history of COVID-19: Secondary | ICD-10-CM | POA: Diagnosis not present

## 2020-02-14 DIAGNOSIS — A419 Sepsis, unspecified organism: Secondary | ICD-10-CM | POA: Diagnosis present

## 2020-02-14 DIAGNOSIS — Z741 Need for assistance with personal care: Secondary | ICD-10-CM | POA: Diagnosis not present

## 2020-02-14 DIAGNOSIS — R4020 Unspecified coma: Secondary | ICD-10-CM | POA: Diagnosis not present

## 2020-02-14 DIAGNOSIS — F0390 Unspecified dementia without behavioral disturbance: Secondary | ICD-10-CM | POA: Diagnosis present

## 2020-02-14 DIAGNOSIS — B964 Proteus (mirabilis) (morganii) as the cause of diseases classified elsewhere: Secondary | ICD-10-CM | POA: Diagnosis present

## 2020-02-14 DIAGNOSIS — R569 Unspecified convulsions: Secondary | ICD-10-CM | POA: Diagnosis not present

## 2020-02-14 DIAGNOSIS — J984 Other disorders of lung: Secondary | ICD-10-CM | POA: Diagnosis not present

## 2020-02-14 DIAGNOSIS — R918 Other nonspecific abnormal finding of lung field: Secondary | ICD-10-CM | POA: Diagnosis not present

## 2020-02-14 DIAGNOSIS — R293 Abnormal posture: Secondary | ICD-10-CM | POA: Diagnosis not present

## 2020-02-14 DIAGNOSIS — R7401 Elevation of levels of liver transaminase levels: Secondary | ICD-10-CM | POA: Diagnosis not present

## 2020-02-14 DIAGNOSIS — Z885 Allergy status to narcotic agent status: Secondary | ICD-10-CM | POA: Diagnosis not present

## 2020-02-14 DIAGNOSIS — R404 Transient alteration of awareness: Secondary | ICD-10-CM | POA: Diagnosis not present

## 2020-02-14 DIAGNOSIS — R262 Difficulty in walking, not elsewhere classified: Secondary | ICD-10-CM | POA: Diagnosis not present

## 2020-02-14 DIAGNOSIS — R1312 Dysphagia, oropharyngeal phase: Secondary | ICD-10-CM | POA: Diagnosis not present

## 2020-02-14 DIAGNOSIS — I69054 Hemiplegia and hemiparesis following nontraumatic subarachnoid hemorrhage affecting left non-dominant side: Secondary | ICD-10-CM | POA: Diagnosis not present

## 2020-02-14 DIAGNOSIS — R41841 Cognitive communication deficit: Secondary | ICD-10-CM | POA: Diagnosis not present

## 2020-02-14 DIAGNOSIS — R0902 Hypoxemia: Secondary | ICD-10-CM | POA: Diagnosis present

## 2020-02-24 DIAGNOSIS — N1831 Chronic kidney disease, stage 3a: Secondary | ICD-10-CM | POA: Diagnosis not present

## 2020-02-24 DIAGNOSIS — R278 Other lack of coordination: Secondary | ICD-10-CM | POA: Diagnosis not present

## 2020-02-24 DIAGNOSIS — G9389 Other specified disorders of brain: Secondary | ICD-10-CM | POA: Diagnosis not present

## 2020-02-24 DIAGNOSIS — J0130 Acute sphenoidal sinusitis, unspecified: Secondary | ICD-10-CM | POA: Diagnosis not present

## 2020-02-24 DIAGNOSIS — I10 Essential (primary) hypertension: Secondary | ICD-10-CM | POA: Diagnosis present

## 2020-02-24 DIAGNOSIS — R627 Adult failure to thrive: Secondary | ICD-10-CM | POA: Diagnosis not present

## 2020-02-24 DIAGNOSIS — G309 Alzheimer's disease, unspecified: Secondary | ICD-10-CM | POA: Diagnosis not present

## 2020-02-24 DIAGNOSIS — R531 Weakness: Secondary | ICD-10-CM | POA: Diagnosis not present

## 2020-02-24 DIAGNOSIS — G9341 Metabolic encephalopathy: Secondary | ICD-10-CM | POA: Diagnosis not present

## 2020-02-24 DIAGNOSIS — R4182 Altered mental status, unspecified: Secondary | ICD-10-CM | POA: Diagnosis not present

## 2020-02-24 DIAGNOSIS — R410 Disorientation, unspecified: Secondary | ICD-10-CM | POA: Diagnosis not present

## 2020-02-24 DIAGNOSIS — N390 Urinary tract infection, site not specified: Secondary | ICD-10-CM | POA: Diagnosis not present

## 2020-02-24 DIAGNOSIS — R9089 Other abnormal findings on diagnostic imaging of central nervous system: Secondary | ICD-10-CM | POA: Diagnosis present

## 2020-02-24 DIAGNOSIS — D500 Iron deficiency anemia secondary to blood loss (chronic): Secondary | ICD-10-CM | POA: Diagnosis present

## 2020-02-24 DIAGNOSIS — R509 Fever, unspecified: Secondary | ICD-10-CM | POA: Diagnosis not present

## 2020-02-24 DIAGNOSIS — G319 Degenerative disease of nervous system, unspecified: Secondary | ICD-10-CM | POA: Diagnosis not present

## 2020-02-24 DIAGNOSIS — E119 Type 2 diabetes mellitus without complications: Secondary | ICD-10-CM | POA: Diagnosis present

## 2020-02-24 DIAGNOSIS — M2550 Pain in unspecified joint: Secondary | ICD-10-CM | POA: Diagnosis not present

## 2020-02-24 DIAGNOSIS — R7401 Elevation of levels of liver transaminase levels: Secondary | ICD-10-CM | POA: Diagnosis not present

## 2020-02-24 DIAGNOSIS — Z7401 Bed confinement status: Secondary | ICD-10-CM | POA: Diagnosis not present

## 2020-02-24 DIAGNOSIS — Z66 Do not resuscitate: Secondary | ICD-10-CM | POA: Diagnosis present

## 2020-02-24 DIAGNOSIS — F0390 Unspecified dementia without behavioral disturbance: Secondary | ICD-10-CM | POA: Diagnosis present

## 2020-02-24 DIAGNOSIS — F418 Other specified anxiety disorders: Secondary | ICD-10-CM | POA: Diagnosis present

## 2020-02-24 DIAGNOSIS — M1990 Unspecified osteoarthritis, unspecified site: Secondary | ICD-10-CM | POA: Diagnosis present

## 2020-02-24 DIAGNOSIS — Z8673 Personal history of transient ischemic attack (TIA), and cerebral infarction without residual deficits: Secondary | ICD-10-CM | POA: Diagnosis not present

## 2020-02-24 DIAGNOSIS — I69054 Hemiplegia and hemiparesis following nontraumatic subarachnoid hemorrhage affecting left non-dominant side: Secondary | ICD-10-CM | POA: Diagnosis not present

## 2020-02-24 DIAGNOSIS — R569 Unspecified convulsions: Secondary | ICD-10-CM | POA: Diagnosis not present

## 2020-02-24 DIAGNOSIS — M810 Age-related osteoporosis without current pathological fracture: Secondary | ICD-10-CM | POA: Diagnosis not present

## 2020-02-24 DIAGNOSIS — D61818 Other pancytopenia: Secondary | ICD-10-CM | POA: Diagnosis not present

## 2020-02-24 DIAGNOSIS — Z79899 Other long term (current) drug therapy: Secondary | ICD-10-CM | POA: Diagnosis not present

## 2020-02-24 DIAGNOSIS — R0902 Hypoxemia: Secondary | ICD-10-CM | POA: Diagnosis not present

## 2020-02-24 DIAGNOSIS — Z87440 Personal history of urinary (tract) infections: Secondary | ICD-10-CM | POA: Diagnosis not present

## 2020-02-24 DIAGNOSIS — R1312 Dysphagia, oropharyngeal phase: Secondary | ICD-10-CM | POA: Diagnosis not present

## 2020-02-24 DIAGNOSIS — J129 Viral pneumonia, unspecified: Secondary | ICD-10-CM | POA: Diagnosis not present

## 2020-02-24 DIAGNOSIS — R262 Difficulty in walking, not elsewhere classified: Secondary | ICD-10-CM | POA: Diagnosis not present

## 2020-02-24 DIAGNOSIS — I7090 Unspecified atherosclerosis: Secondary | ICD-10-CM | POA: Diagnosis not present

## 2020-02-24 DIAGNOSIS — M6281 Muscle weakness (generalized): Secondary | ICD-10-CM | POA: Diagnosis not present

## 2020-02-24 DIAGNOSIS — U071 COVID-19: Secondary | ICD-10-CM | POA: Diagnosis not present

## 2020-02-24 DIAGNOSIS — E785 Hyperlipidemia, unspecified: Secondary | ICD-10-CM | POA: Diagnosis not present

## 2020-02-24 DIAGNOSIS — R532 Functional quadriplegia: Secondary | ICD-10-CM | POA: Diagnosis not present

## 2020-02-24 DIAGNOSIS — E43 Unspecified severe protein-calorie malnutrition: Secondary | ICD-10-CM | POA: Diagnosis not present

## 2020-02-24 DIAGNOSIS — I119 Hypertensive heart disease without heart failure: Secondary | ICD-10-CM | POA: Diagnosis not present

## 2020-02-24 DIAGNOSIS — J9621 Acute and chronic respiratory failure with hypoxia: Secondary | ICD-10-CM | POA: Diagnosis not present

## 2020-02-24 DIAGNOSIS — Z888 Allergy status to other drugs, medicaments and biological substances status: Secondary | ICD-10-CM | POA: Diagnosis not present

## 2020-02-24 DIAGNOSIS — R293 Abnormal posture: Secondary | ICD-10-CM | POA: Diagnosis not present

## 2020-02-24 DIAGNOSIS — R41841 Cognitive communication deficit: Secondary | ICD-10-CM | POA: Diagnosis not present

## 2020-02-24 DIAGNOSIS — I700 Atherosclerosis of aorta: Secondary | ICD-10-CM | POA: Diagnosis not present

## 2020-02-24 DIAGNOSIS — R296 Repeated falls: Secondary | ICD-10-CM | POA: Diagnosis not present

## 2020-02-24 DIAGNOSIS — G9340 Encephalopathy, unspecified: Secondary | ICD-10-CM | POA: Diagnosis present

## 2020-02-24 DIAGNOSIS — E039 Hypothyroidism, unspecified: Secondary | ICD-10-CM | POA: Diagnosis present

## 2020-02-24 DIAGNOSIS — J1282 Pneumonia due to coronavirus disease 2019: Secondary | ICD-10-CM | POA: Diagnosis not present

## 2020-02-24 DIAGNOSIS — Z741 Need for assistance with personal care: Secondary | ICD-10-CM | POA: Diagnosis not present

## 2020-02-24 DIAGNOSIS — E7800 Pure hypercholesterolemia, unspecified: Secondary | ICD-10-CM | POA: Diagnosis present

## 2020-02-27 DIAGNOSIS — G9341 Metabolic encephalopathy: Secondary | ICD-10-CM | POA: Diagnosis not present

## 2020-02-27 DIAGNOSIS — I119 Hypertensive heart disease without heart failure: Secondary | ICD-10-CM | POA: Diagnosis not present

## 2020-02-27 DIAGNOSIS — N1831 Chronic kidney disease, stage 3a: Secondary | ICD-10-CM | POA: Diagnosis not present

## 2020-02-27 DIAGNOSIS — N390 Urinary tract infection, site not specified: Secondary | ICD-10-CM | POA: Diagnosis not present

## 2020-02-29 DIAGNOSIS — I700 Atherosclerosis of aorta: Secondary | ICD-10-CM | POA: Diagnosis not present

## 2020-02-29 DIAGNOSIS — E119 Type 2 diabetes mellitus without complications: Secondary | ICD-10-CM | POA: Diagnosis present

## 2020-02-29 DIAGNOSIS — R531 Weakness: Secondary | ICD-10-CM | POA: Diagnosis not present

## 2020-02-29 DIAGNOSIS — G309 Alzheimer's disease, unspecified: Secondary | ICD-10-CM | POA: Diagnosis not present

## 2020-02-29 DIAGNOSIS — E43 Unspecified severe protein-calorie malnutrition: Secondary | ICD-10-CM | POA: Diagnosis not present

## 2020-02-29 DIAGNOSIS — M1990 Unspecified osteoarthritis, unspecified site: Secondary | ICD-10-CM | POA: Diagnosis not present

## 2020-02-29 DIAGNOSIS — I7090 Unspecified atherosclerosis: Secondary | ICD-10-CM | POA: Diagnosis not present

## 2020-02-29 DIAGNOSIS — I959 Hypotension, unspecified: Secondary | ICD-10-CM | POA: Diagnosis not present

## 2020-02-29 DIAGNOSIS — G9389 Other specified disorders of brain: Secondary | ICD-10-CM | POA: Diagnosis not present

## 2020-02-29 DIAGNOSIS — G9340 Encephalopathy, unspecified: Secondary | ICD-10-CM | POA: Diagnosis present

## 2020-02-29 DIAGNOSIS — R7401 Elevation of levels of liver transaminase levels: Secondary | ICD-10-CM | POA: Diagnosis not present

## 2020-02-29 DIAGNOSIS — R4182 Altered mental status, unspecified: Secondary | ICD-10-CM | POA: Diagnosis not present

## 2020-02-29 DIAGNOSIS — Z66 Do not resuscitate: Secondary | ICD-10-CM | POA: Diagnosis not present

## 2020-02-29 DIAGNOSIS — E7800 Pure hypercholesterolemia, unspecified: Secondary | ICD-10-CM | POA: Diagnosis not present

## 2020-02-29 DIAGNOSIS — F0390 Unspecified dementia without behavioral disturbance: Secondary | ICD-10-CM | POA: Diagnosis not present

## 2020-02-29 DIAGNOSIS — I10 Essential (primary) hypertension: Secondary | ICD-10-CM | POA: Diagnosis not present

## 2020-02-29 DIAGNOSIS — M2550 Pain in unspecified joint: Secondary | ICD-10-CM | POA: Diagnosis not present

## 2020-02-29 DIAGNOSIS — Z888 Allergy status to other drugs, medicaments and biological substances status: Secondary | ICD-10-CM | POA: Diagnosis not present

## 2020-02-29 DIAGNOSIS — D500 Iron deficiency anemia secondary to blood loss (chronic): Secondary | ICD-10-CM | POA: Diagnosis present

## 2020-02-29 DIAGNOSIS — Z79899 Other long term (current) drug therapy: Secondary | ICD-10-CM | POA: Diagnosis not present

## 2020-02-29 DIAGNOSIS — Z8673 Personal history of transient ischemic attack (TIA), and cerebral infarction without residual deficits: Secondary | ICD-10-CM | POA: Diagnosis not present

## 2020-02-29 DIAGNOSIS — I119 Hypertensive heart disease without heart failure: Secondary | ICD-10-CM | POA: Diagnosis not present

## 2020-02-29 DIAGNOSIS — R9089 Other abnormal findings on diagnostic imaging of central nervous system: Secondary | ICD-10-CM | POA: Diagnosis present

## 2020-02-29 DIAGNOSIS — R532 Functional quadriplegia: Secondary | ICD-10-CM | POA: Diagnosis not present

## 2020-02-29 DIAGNOSIS — G319 Degenerative disease of nervous system, unspecified: Secondary | ICD-10-CM | POA: Diagnosis not present

## 2020-02-29 DIAGNOSIS — J0130 Acute sphenoidal sinusitis, unspecified: Secondary | ICD-10-CM | POA: Diagnosis not present

## 2020-02-29 DIAGNOSIS — D61818 Other pancytopenia: Secondary | ICD-10-CM | POA: Diagnosis not present

## 2020-02-29 DIAGNOSIS — Z87440 Personal history of urinary (tract) infections: Secondary | ICD-10-CM | POA: Diagnosis not present

## 2020-02-29 DIAGNOSIS — Z7401 Bed confinement status: Secondary | ICD-10-CM | POA: Diagnosis not present

## 2020-02-29 DIAGNOSIS — F418 Other specified anxiety disorders: Secondary | ICD-10-CM | POA: Diagnosis present

## 2020-02-29 DIAGNOSIS — E039 Hypothyroidism, unspecified: Secondary | ICD-10-CM | POA: Diagnosis present

## 2020-02-29 DIAGNOSIS — N390 Urinary tract infection, site not specified: Secondary | ICD-10-CM | POA: Diagnosis not present

## 2020-02-29 IMAGING — CT CT HEAD W/O CM
4 series · 16 of 47 positions shown, 18 images · non-contrast
Comparison: 07/01/2018

CLINICAL DATA: Subdural hematomas. Increased lethargy.

EXAM:
CT HEAD WITHOUT CONTRAST
TECHNIQUE: Contiguous axial images were obtained from the base of the skull
through the vertex without intravenous contrast.

[Series 3: head wo · axial · 0.47mm/px · z∈[-120,-0]mm · 7 of 34 slices shown, 9 images]
[im 5/34  brain]
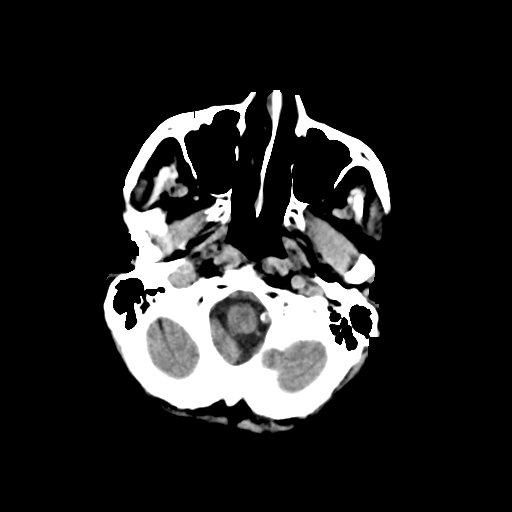
[im 5/34  bone]
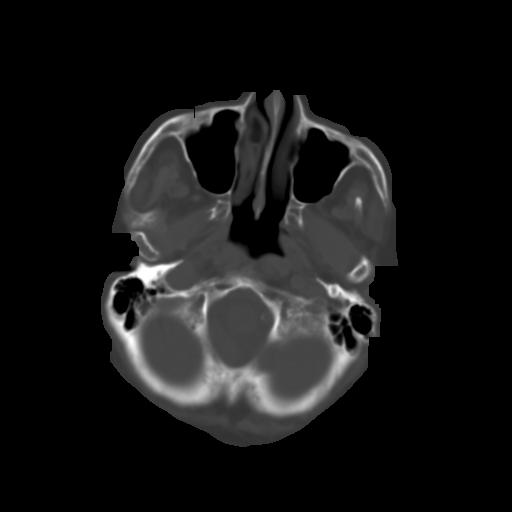
[im 9/34  brain]
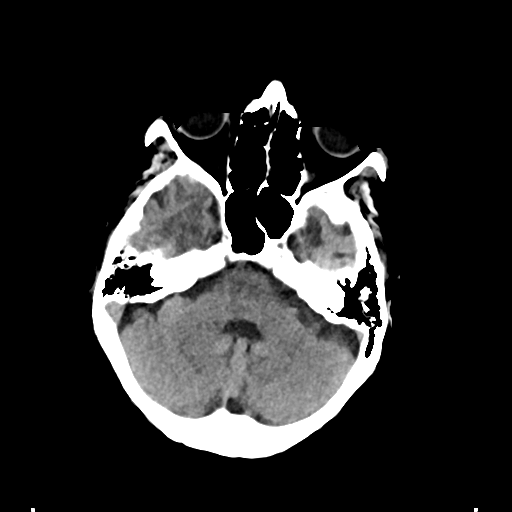
[im 13/34  brain]
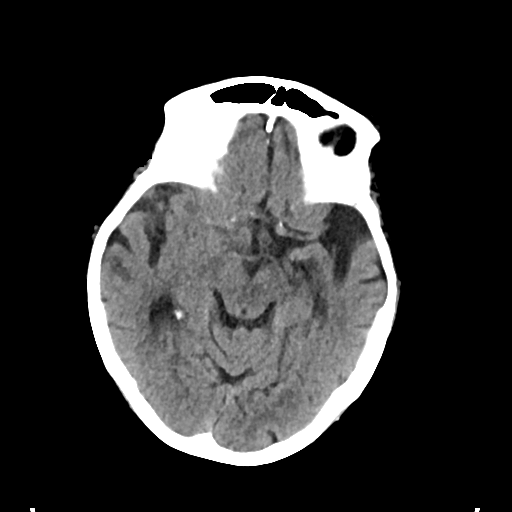
[im 17/34  brain]
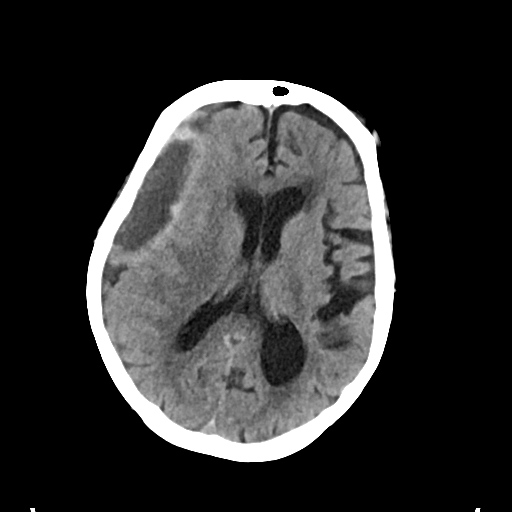
[im 21/34  brain]
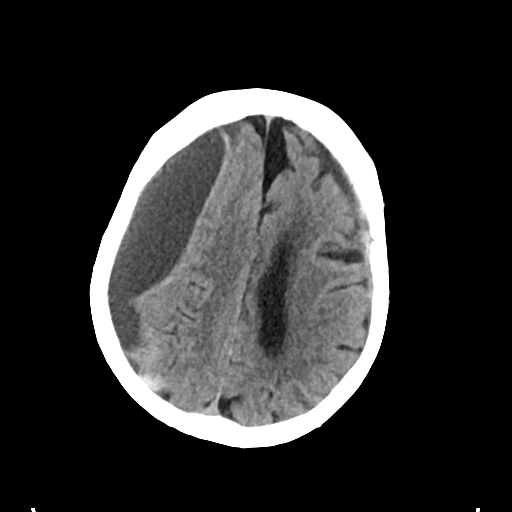
[im 21/34  bone]
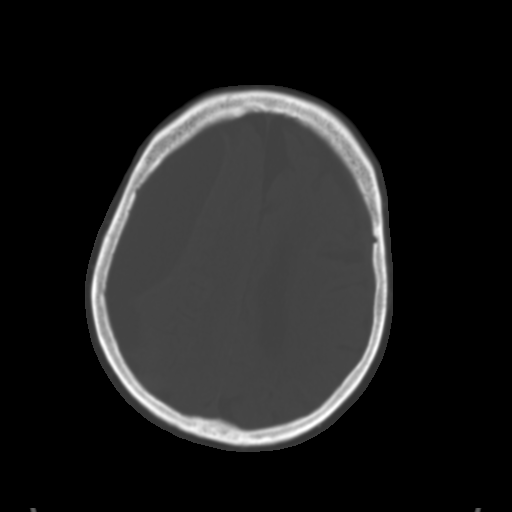
[im 25/34  brain]
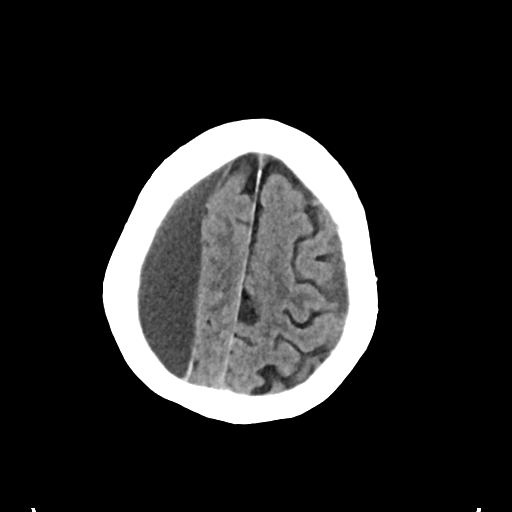
[im 29/34  brain]
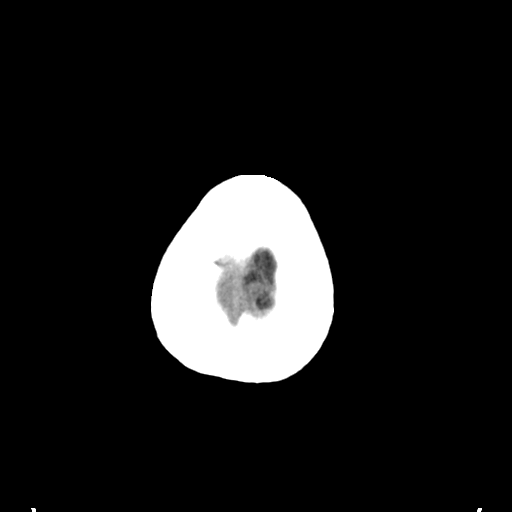

[Series 4: head bone · axial · 0.47mm/px · z∈[-124,-92]mm · 3 of 84 slices shown]
[im 9/84  bone]
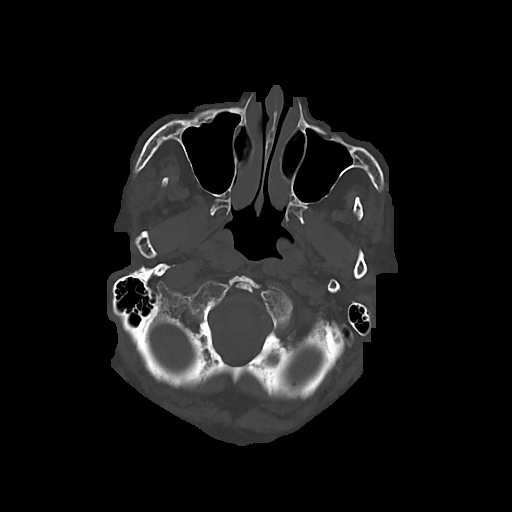
[im 17/84  bone]
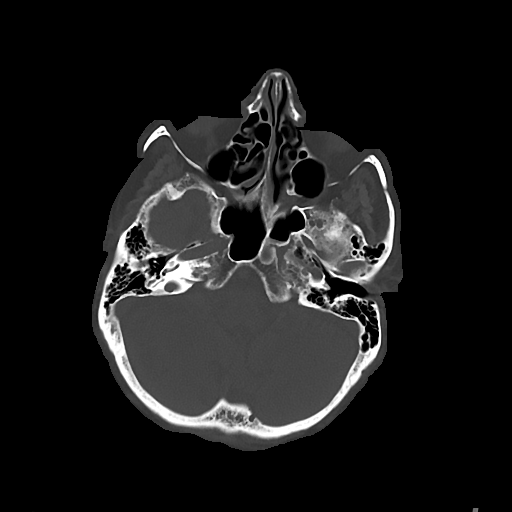
[im 25/84  bone]
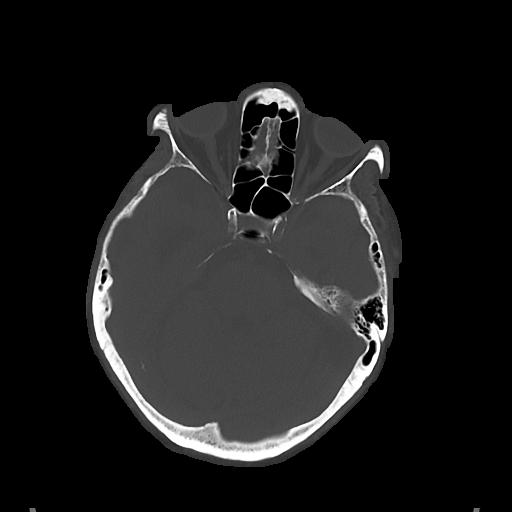

[Series 5: cor soft · coronal · 0.32mm/px · 3 of 70 slices shown]
[im 24/70  brain]
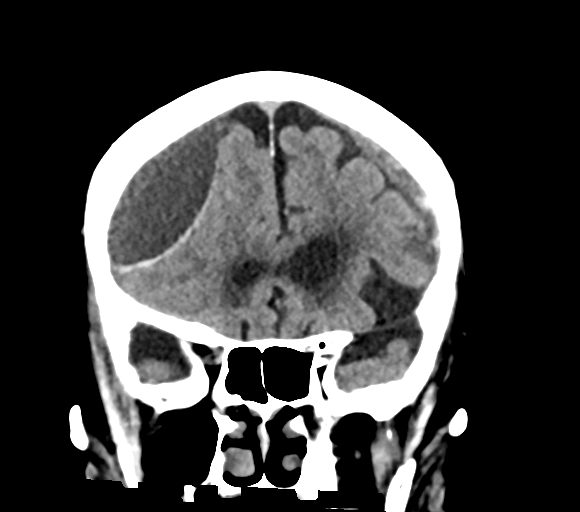
[im 31/70  brain]
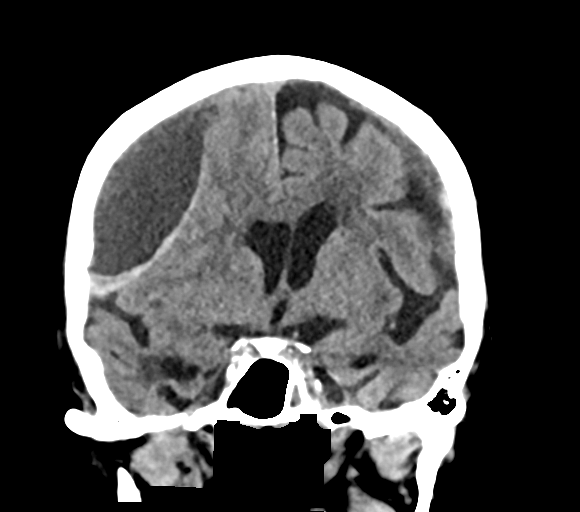
[im 39/70  brain]
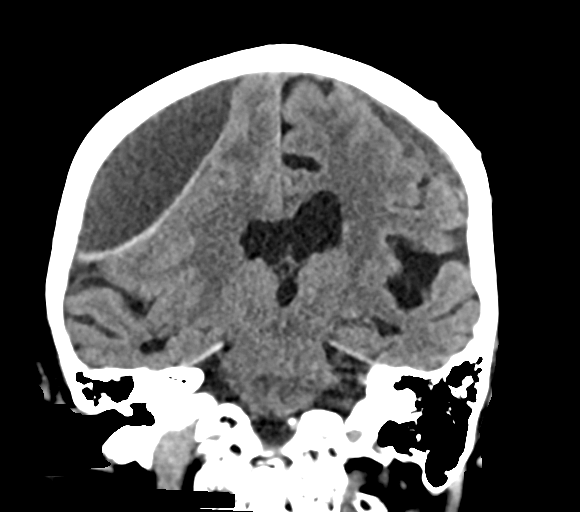

[Series 6: sag soft · sagittal · 0.32mm/px · 3 of 58 slices shown]
[im 20/58  brain]
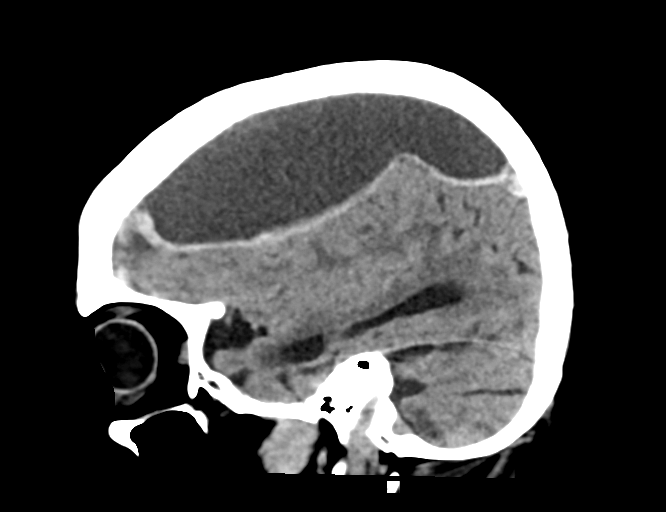
[im 29/58  brain]
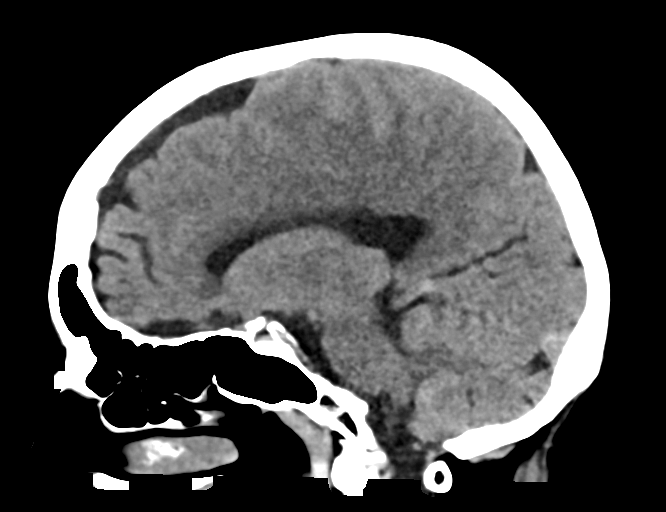
[im 39/58  brain]
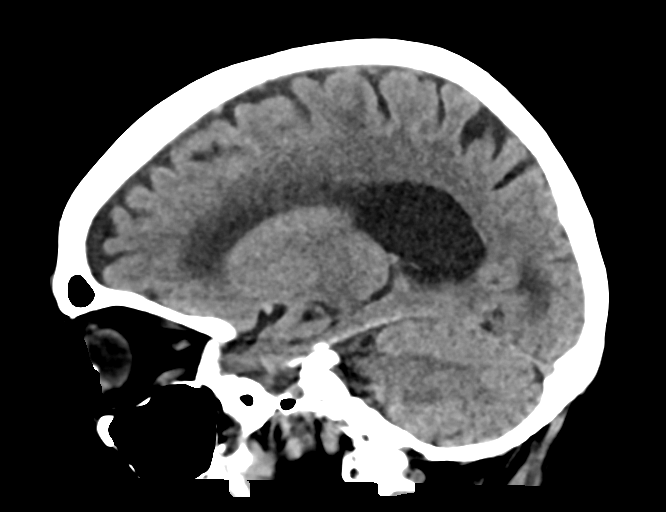

[16 of 47 positions shown; findings below may reference images not displayed]

FINDINGS: Brain: Large subdural hematoma over the right cerebral convexity of
mixed though predominantly low density is unchanged in size
measuring up to 30 mm in thickness with unchanged mass effect
including 7 mm of leftward midline shift. Mixed density subdural
hematoma over the left cerebral convexity is also unchanged in size
measuring 7 mm in thickness. No new intracranial hemorrhage or acute
large territory infarct is identified. The ventricles are unchanged
in size. Patchy cerebral white matter hypodensities are unchanged
and nonspecific but compatible with mild chronic small vessel
ischemic disease. The basilar cisterns are patent.

Vascular: Calcified atherosclerosis at the skull base. No hyperdense
vessel.

Skull: No fracture or focal osseous lesion.

Sinuses/Orbits: Unchanged small volume left sphenoid sinus fluid.
Clear mastoid air cells. Bilateral cataract extraction.

Other: None.
IMPRESSION: 1. Unchanged large right and small left left subdural hematomas.
Unchanged 7 mm leftward midline shift.
2. No new intracranial abnormality.

## 2020-03-03 DIAGNOSIS — G301 Alzheimer's disease with late onset: Secondary | ICD-10-CM | POA: Diagnosis not present

## 2020-03-03 DIAGNOSIS — Z87440 Personal history of urinary (tract) infections: Secondary | ICD-10-CM | POA: Diagnosis not present

## 2020-03-03 DIAGNOSIS — R1310 Dysphagia, unspecified: Secondary | ICD-10-CM | POA: Diagnosis not present

## 2020-03-03 DIAGNOSIS — R634 Abnormal weight loss: Secondary | ICD-10-CM | POA: Diagnosis not present

## 2020-03-03 DIAGNOSIS — E119 Type 2 diabetes mellitus without complications: Secondary | ICD-10-CM | POA: Diagnosis not present

## 2020-03-03 DIAGNOSIS — I10 Essential (primary) hypertension: Secondary | ICD-10-CM | POA: Diagnosis not present

## 2020-03-03 DIAGNOSIS — I679 Cerebrovascular disease, unspecified: Secondary | ICD-10-CM | POA: Diagnosis not present

## 2020-03-03 DIAGNOSIS — Z8616 Personal history of COVID-19: Secondary | ICD-10-CM | POA: Diagnosis not present

## 2020-03-03 DIAGNOSIS — Z8673 Personal history of transient ischemic attack (TIA), and cerebral infarction without residual deficits: Secondary | ICD-10-CM | POA: Diagnosis not present

## 2020-03-03 DIAGNOSIS — G9340 Encephalopathy, unspecified: Secondary | ICD-10-CM | POA: Diagnosis not present

## 2020-03-03 DIAGNOSIS — E039 Hypothyroidism, unspecified: Secondary | ICD-10-CM | POA: Diagnosis not present

## 2020-03-03 DIAGNOSIS — M810 Age-related osteoporosis without current pathological fracture: Secondary | ICD-10-CM | POA: Diagnosis not present

## 2020-03-03 DIAGNOSIS — F0280 Dementia in other diseases classified elsewhere without behavioral disturbance: Secondary | ICD-10-CM | POA: Diagnosis not present

## 2020-03-05 DIAGNOSIS — I10 Essential (primary) hypertension: Secondary | ICD-10-CM | POA: Diagnosis not present

## 2020-03-05 DIAGNOSIS — R627 Adult failure to thrive: Secondary | ICD-10-CM | POA: Diagnosis not present

## 2020-03-05 DIAGNOSIS — G911 Obstructive hydrocephalus: Secondary | ICD-10-CM | POA: Diagnosis not present

## 2020-03-05 DIAGNOSIS — R1310 Dysphagia, unspecified: Secondary | ICD-10-CM | POA: Diagnosis not present

## 2020-03-05 DIAGNOSIS — I679 Cerebrovascular disease, unspecified: Secondary | ICD-10-CM | POA: Diagnosis not present

## 2020-03-05 DIAGNOSIS — G301 Alzheimer's disease with late onset: Secondary | ICD-10-CM | POA: Diagnosis not present

## 2020-03-05 DIAGNOSIS — Z8616 Personal history of COVID-19: Secondary | ICD-10-CM | POA: Diagnosis not present

## 2020-03-05 DIAGNOSIS — G9340 Encephalopathy, unspecified: Secondary | ICD-10-CM | POA: Diagnosis not present

## 2020-03-06 DIAGNOSIS — R1310 Dysphagia, unspecified: Secondary | ICD-10-CM | POA: Diagnosis not present

## 2020-03-06 DIAGNOSIS — I10 Essential (primary) hypertension: Secondary | ICD-10-CM | POA: Diagnosis not present

## 2020-03-06 DIAGNOSIS — Z8616 Personal history of COVID-19: Secondary | ICD-10-CM | POA: Diagnosis not present

## 2020-03-06 DIAGNOSIS — G9340 Encephalopathy, unspecified: Secondary | ICD-10-CM | POA: Diagnosis not present

## 2020-03-06 DIAGNOSIS — I679 Cerebrovascular disease, unspecified: Secondary | ICD-10-CM | POA: Diagnosis not present

## 2020-03-06 DIAGNOSIS — G301 Alzheimer's disease with late onset: Secondary | ICD-10-CM | POA: Diagnosis not present

## 2020-03-07 DIAGNOSIS — G9340 Encephalopathy, unspecified: Secondary | ICD-10-CM | POA: Diagnosis not present

## 2020-03-07 DIAGNOSIS — Z8616 Personal history of COVID-19: Secondary | ICD-10-CM | POA: Diagnosis not present

## 2020-03-07 DIAGNOSIS — G301 Alzheimer's disease with late onset: Secondary | ICD-10-CM | POA: Diagnosis not present

## 2020-03-07 DIAGNOSIS — I679 Cerebrovascular disease, unspecified: Secondary | ICD-10-CM | POA: Diagnosis not present

## 2020-03-07 DIAGNOSIS — R1310 Dysphagia, unspecified: Secondary | ICD-10-CM | POA: Diagnosis not present

## 2020-03-07 DIAGNOSIS — I10 Essential (primary) hypertension: Secondary | ICD-10-CM | POA: Diagnosis not present

## 2020-03-12 DIAGNOSIS — I679 Cerebrovascular disease, unspecified: Secondary | ICD-10-CM | POA: Diagnosis not present

## 2020-03-12 DIAGNOSIS — R1310 Dysphagia, unspecified: Secondary | ICD-10-CM | POA: Diagnosis not present

## 2020-03-12 DIAGNOSIS — M1990 Unspecified osteoarthritis, unspecified site: Secondary | ICD-10-CM | POA: Diagnosis not present

## 2020-03-12 DIAGNOSIS — G301 Alzheimer's disease with late onset: Secondary | ICD-10-CM | POA: Diagnosis not present

## 2020-03-12 DIAGNOSIS — M810 Age-related osteoporosis without current pathological fracture: Secondary | ICD-10-CM | POA: Diagnosis not present

## 2020-03-12 DIAGNOSIS — I10 Essential (primary) hypertension: Secondary | ICD-10-CM | POA: Diagnosis not present

## 2020-03-12 DIAGNOSIS — Z8616 Personal history of COVID-19: Secondary | ICD-10-CM | POA: Diagnosis not present

## 2020-03-12 DIAGNOSIS — G9340 Encephalopathy, unspecified: Secondary | ICD-10-CM | POA: Diagnosis not present

## 2020-03-12 DIAGNOSIS — I2510 Atherosclerotic heart disease of native coronary artery without angina pectoris: Secondary | ICD-10-CM | POA: Diagnosis not present

## 2020-03-12 DIAGNOSIS — Z03818 Encounter for observation for suspected exposure to other biological agents ruled out: Secondary | ICD-10-CM | POA: Diagnosis not present

## 2020-03-13 DIAGNOSIS — G301 Alzheimer's disease with late onset: Secondary | ICD-10-CM | POA: Diagnosis not present

## 2020-03-13 DIAGNOSIS — G9340 Encephalopathy, unspecified: Secondary | ICD-10-CM | POA: Diagnosis not present

## 2020-03-13 DIAGNOSIS — R1310 Dysphagia, unspecified: Secondary | ICD-10-CM | POA: Diagnosis not present

## 2020-03-13 DIAGNOSIS — I10 Essential (primary) hypertension: Secondary | ICD-10-CM | POA: Diagnosis not present

## 2020-03-13 DIAGNOSIS — Z8616 Personal history of COVID-19: Secondary | ICD-10-CM | POA: Diagnosis not present

## 2020-03-13 DIAGNOSIS — I679 Cerebrovascular disease, unspecified: Secondary | ICD-10-CM | POA: Diagnosis not present

## 2020-03-14 DIAGNOSIS — Z8616 Personal history of COVID-19: Secondary | ICD-10-CM | POA: Diagnosis not present

## 2020-03-14 DIAGNOSIS — R1310 Dysphagia, unspecified: Secondary | ICD-10-CM | POA: Diagnosis not present

## 2020-03-14 DIAGNOSIS — Z8673 Personal history of transient ischemic attack (TIA), and cerebral infarction without residual deficits: Secondary | ICD-10-CM | POA: Diagnosis not present

## 2020-03-14 DIAGNOSIS — I679 Cerebrovascular disease, unspecified: Secondary | ICD-10-CM | POA: Diagnosis not present

## 2020-03-14 DIAGNOSIS — E119 Type 2 diabetes mellitus without complications: Secondary | ICD-10-CM | POA: Diagnosis not present

## 2020-03-14 DIAGNOSIS — E039 Hypothyroidism, unspecified: Secondary | ICD-10-CM | POA: Diagnosis not present

## 2020-03-14 DIAGNOSIS — I10 Essential (primary) hypertension: Secondary | ICD-10-CM | POA: Diagnosis not present

## 2020-03-14 DIAGNOSIS — G9340 Encephalopathy, unspecified: Secondary | ICD-10-CM | POA: Diagnosis not present

## 2020-03-14 DIAGNOSIS — F0280 Dementia in other diseases classified elsewhere without behavioral disturbance: Secondary | ICD-10-CM | POA: Diagnosis not present

## 2020-03-14 DIAGNOSIS — G301 Alzheimer's disease with late onset: Secondary | ICD-10-CM | POA: Diagnosis not present

## 2020-03-14 DIAGNOSIS — Z87440 Personal history of urinary (tract) infections: Secondary | ICD-10-CM | POA: Diagnosis not present

## 2020-03-14 DIAGNOSIS — M810 Age-related osteoporosis without current pathological fracture: Secondary | ICD-10-CM | POA: Diagnosis not present

## 2020-03-14 DIAGNOSIS — R634 Abnormal weight loss: Secondary | ICD-10-CM | POA: Diagnosis not present

## 2020-03-15 DIAGNOSIS — G9340 Encephalopathy, unspecified: Secondary | ICD-10-CM | POA: Diagnosis not present

## 2020-03-15 DIAGNOSIS — G301 Alzheimer's disease with late onset: Secondary | ICD-10-CM | POA: Diagnosis not present

## 2020-03-15 DIAGNOSIS — Z8616 Personal history of COVID-19: Secondary | ICD-10-CM | POA: Diagnosis not present

## 2020-03-15 DIAGNOSIS — I679 Cerebrovascular disease, unspecified: Secondary | ICD-10-CM | POA: Diagnosis not present

## 2020-03-15 DIAGNOSIS — I10 Essential (primary) hypertension: Secondary | ICD-10-CM | POA: Diagnosis not present

## 2020-03-15 DIAGNOSIS — R1310 Dysphagia, unspecified: Secondary | ICD-10-CM | POA: Diagnosis not present

## 2020-03-20 DIAGNOSIS — G9340 Encephalopathy, unspecified: Secondary | ICD-10-CM | POA: Diagnosis not present

## 2020-03-20 DIAGNOSIS — Z8616 Personal history of COVID-19: Secondary | ICD-10-CM | POA: Diagnosis not present

## 2020-03-20 DIAGNOSIS — G301 Alzheimer's disease with late onset: Secondary | ICD-10-CM | POA: Diagnosis not present

## 2020-03-20 DIAGNOSIS — R1310 Dysphagia, unspecified: Secondary | ICD-10-CM | POA: Diagnosis not present

## 2020-03-20 DIAGNOSIS — I10 Essential (primary) hypertension: Secondary | ICD-10-CM | POA: Diagnosis not present

## 2020-03-20 DIAGNOSIS — Z03818 Encounter for observation for suspected exposure to other biological agents ruled out: Secondary | ICD-10-CM | POA: Diagnosis not present

## 2020-03-20 DIAGNOSIS — I679 Cerebrovascular disease, unspecified: Secondary | ICD-10-CM | POA: Diagnosis not present

## 2020-03-21 DIAGNOSIS — G301 Alzheimer's disease with late onset: Secondary | ICD-10-CM | POA: Diagnosis not present

## 2020-03-21 DIAGNOSIS — I679 Cerebrovascular disease, unspecified: Secondary | ICD-10-CM | POA: Diagnosis not present

## 2020-03-21 DIAGNOSIS — R1310 Dysphagia, unspecified: Secondary | ICD-10-CM | POA: Diagnosis not present

## 2020-03-21 DIAGNOSIS — I10 Essential (primary) hypertension: Secondary | ICD-10-CM | POA: Diagnosis not present

## 2020-03-21 DIAGNOSIS — Z8616 Personal history of COVID-19: Secondary | ICD-10-CM | POA: Diagnosis not present

## 2020-03-21 DIAGNOSIS — G9340 Encephalopathy, unspecified: Secondary | ICD-10-CM | POA: Diagnosis not present

## 2020-03-22 DIAGNOSIS — R1310 Dysphagia, unspecified: Secondary | ICD-10-CM | POA: Diagnosis not present

## 2020-03-22 DIAGNOSIS — I10 Essential (primary) hypertension: Secondary | ICD-10-CM | POA: Diagnosis not present

## 2020-03-22 DIAGNOSIS — G9340 Encephalopathy, unspecified: Secondary | ICD-10-CM | POA: Diagnosis not present

## 2020-03-22 DIAGNOSIS — Z8616 Personal history of COVID-19: Secondary | ICD-10-CM | POA: Diagnosis not present

## 2020-03-22 DIAGNOSIS — I679 Cerebrovascular disease, unspecified: Secondary | ICD-10-CM | POA: Diagnosis not present

## 2020-03-22 DIAGNOSIS — G301 Alzheimer's disease with late onset: Secondary | ICD-10-CM | POA: Diagnosis not present

## 2020-03-23 DIAGNOSIS — I679 Cerebrovascular disease, unspecified: Secondary | ICD-10-CM | POA: Diagnosis not present

## 2020-03-23 DIAGNOSIS — I10 Essential (primary) hypertension: Secondary | ICD-10-CM | POA: Diagnosis not present

## 2020-03-23 DIAGNOSIS — Z8616 Personal history of COVID-19: Secondary | ICD-10-CM | POA: Diagnosis not present

## 2020-03-23 DIAGNOSIS — R1310 Dysphagia, unspecified: Secondary | ICD-10-CM | POA: Diagnosis not present

## 2020-03-23 DIAGNOSIS — G301 Alzheimer's disease with late onset: Secondary | ICD-10-CM | POA: Diagnosis not present

## 2020-03-23 DIAGNOSIS — G9340 Encephalopathy, unspecified: Secondary | ICD-10-CM | POA: Diagnosis not present

## 2020-03-26 DIAGNOSIS — Z03818 Encounter for observation for suspected exposure to other biological agents ruled out: Secondary | ICD-10-CM | POA: Diagnosis not present

## 2020-03-27 DIAGNOSIS — G301 Alzheimer's disease with late onset: Secondary | ICD-10-CM | POA: Diagnosis not present

## 2020-03-27 DIAGNOSIS — Z8616 Personal history of COVID-19: Secondary | ICD-10-CM | POA: Diagnosis not present

## 2020-03-27 DIAGNOSIS — R1310 Dysphagia, unspecified: Secondary | ICD-10-CM | POA: Diagnosis not present

## 2020-03-27 DIAGNOSIS — G9340 Encephalopathy, unspecified: Secondary | ICD-10-CM | POA: Diagnosis not present

## 2020-03-27 DIAGNOSIS — I679 Cerebrovascular disease, unspecified: Secondary | ICD-10-CM | POA: Diagnosis not present

## 2020-03-27 DIAGNOSIS — I10 Essential (primary) hypertension: Secondary | ICD-10-CM | POA: Diagnosis not present

## 2020-03-28 DIAGNOSIS — G301 Alzheimer's disease with late onset: Secondary | ICD-10-CM | POA: Diagnosis not present

## 2020-03-28 DIAGNOSIS — Z8616 Personal history of COVID-19: Secondary | ICD-10-CM | POA: Diagnosis not present

## 2020-03-28 DIAGNOSIS — I679 Cerebrovascular disease, unspecified: Secondary | ICD-10-CM | POA: Diagnosis not present

## 2020-03-28 DIAGNOSIS — R1310 Dysphagia, unspecified: Secondary | ICD-10-CM | POA: Diagnosis not present

## 2020-03-28 DIAGNOSIS — G9340 Encephalopathy, unspecified: Secondary | ICD-10-CM | POA: Diagnosis not present

## 2020-03-28 DIAGNOSIS — I10 Essential (primary) hypertension: Secondary | ICD-10-CM | POA: Diagnosis not present

## 2020-03-29 DIAGNOSIS — G301 Alzheimer's disease with late onset: Secondary | ICD-10-CM | POA: Diagnosis not present

## 2020-03-29 DIAGNOSIS — Z8616 Personal history of COVID-19: Secondary | ICD-10-CM | POA: Diagnosis not present

## 2020-03-29 DIAGNOSIS — G9340 Encephalopathy, unspecified: Secondary | ICD-10-CM | POA: Diagnosis not present

## 2020-03-29 DIAGNOSIS — I10 Essential (primary) hypertension: Secondary | ICD-10-CM | POA: Diagnosis not present

## 2020-03-29 DIAGNOSIS — R1310 Dysphagia, unspecified: Secondary | ICD-10-CM | POA: Diagnosis not present

## 2020-03-29 DIAGNOSIS — I679 Cerebrovascular disease, unspecified: Secondary | ICD-10-CM | POA: Diagnosis not present

## 2020-03-30 DIAGNOSIS — I679 Cerebrovascular disease, unspecified: Secondary | ICD-10-CM | POA: Diagnosis not present

## 2020-03-30 DIAGNOSIS — Z8616 Personal history of COVID-19: Secondary | ICD-10-CM | POA: Diagnosis not present

## 2020-03-30 DIAGNOSIS — G301 Alzheimer's disease with late onset: Secondary | ICD-10-CM | POA: Diagnosis not present

## 2020-03-30 DIAGNOSIS — R1310 Dysphagia, unspecified: Secondary | ICD-10-CM | POA: Diagnosis not present

## 2020-03-30 DIAGNOSIS — G9340 Encephalopathy, unspecified: Secondary | ICD-10-CM | POA: Diagnosis not present

## 2020-03-30 DIAGNOSIS — I10 Essential (primary) hypertension: Secondary | ICD-10-CM | POA: Diagnosis not present

## 2020-04-02 DIAGNOSIS — F0391 Unspecified dementia with behavioral disturbance: Secondary | ICD-10-CM | POA: Diagnosis not present

## 2020-04-02 DIAGNOSIS — Z03818 Encounter for observation for suspected exposure to other biological agents ruled out: Secondary | ICD-10-CM | POA: Diagnosis not present

## 2020-04-02 DIAGNOSIS — R627 Adult failure to thrive: Secondary | ICD-10-CM | POA: Diagnosis not present

## 2020-04-02 DIAGNOSIS — G911 Obstructive hydrocephalus: Secondary | ICD-10-CM | POA: Diagnosis not present

## 2020-04-03 DIAGNOSIS — I10 Essential (primary) hypertension: Secondary | ICD-10-CM | POA: Diagnosis not present

## 2020-04-03 DIAGNOSIS — G9340 Encephalopathy, unspecified: Secondary | ICD-10-CM | POA: Diagnosis not present

## 2020-04-03 DIAGNOSIS — I679 Cerebrovascular disease, unspecified: Secondary | ICD-10-CM | POA: Diagnosis not present

## 2020-04-03 DIAGNOSIS — G301 Alzheimer's disease with late onset: Secondary | ICD-10-CM | POA: Diagnosis not present

## 2020-04-03 DIAGNOSIS — Z8616 Personal history of COVID-19: Secondary | ICD-10-CM | POA: Diagnosis not present

## 2020-04-03 DIAGNOSIS — R1310 Dysphagia, unspecified: Secondary | ICD-10-CM | POA: Diagnosis not present

## 2020-04-04 DIAGNOSIS — R1310 Dysphagia, unspecified: Secondary | ICD-10-CM | POA: Diagnosis not present

## 2020-04-04 DIAGNOSIS — G301 Alzheimer's disease with late onset: Secondary | ICD-10-CM | POA: Diagnosis not present

## 2020-04-04 DIAGNOSIS — I679 Cerebrovascular disease, unspecified: Secondary | ICD-10-CM | POA: Diagnosis not present

## 2020-04-04 DIAGNOSIS — G9340 Encephalopathy, unspecified: Secondary | ICD-10-CM | POA: Diagnosis not present

## 2020-04-04 DIAGNOSIS — Z8616 Personal history of COVID-19: Secondary | ICD-10-CM | POA: Diagnosis not present

## 2020-04-04 DIAGNOSIS — I10 Essential (primary) hypertension: Secondary | ICD-10-CM | POA: Diagnosis not present

## 2020-04-05 DIAGNOSIS — Z8616 Personal history of COVID-19: Secondary | ICD-10-CM | POA: Diagnosis not present

## 2020-04-05 DIAGNOSIS — G301 Alzheimer's disease with late onset: Secondary | ICD-10-CM | POA: Diagnosis not present

## 2020-04-05 DIAGNOSIS — I679 Cerebrovascular disease, unspecified: Secondary | ICD-10-CM | POA: Diagnosis not present

## 2020-04-05 DIAGNOSIS — R1310 Dysphagia, unspecified: Secondary | ICD-10-CM | POA: Diagnosis not present

## 2020-04-05 DIAGNOSIS — G9340 Encephalopathy, unspecified: Secondary | ICD-10-CM | POA: Diagnosis not present

## 2020-04-05 DIAGNOSIS — I10 Essential (primary) hypertension: Secondary | ICD-10-CM | POA: Diagnosis not present

## 2020-04-06 DIAGNOSIS — G9340 Encephalopathy, unspecified: Secondary | ICD-10-CM | POA: Diagnosis not present

## 2020-04-06 DIAGNOSIS — E039 Hypothyroidism, unspecified: Secondary | ICD-10-CM | POA: Diagnosis not present

## 2020-04-06 DIAGNOSIS — G301 Alzheimer's disease with late onset: Secondary | ICD-10-CM | POA: Diagnosis not present

## 2020-04-06 DIAGNOSIS — Z8616 Personal history of COVID-19: Secondary | ICD-10-CM | POA: Diagnosis not present

## 2020-04-06 DIAGNOSIS — E785 Hyperlipidemia, unspecified: Secondary | ICD-10-CM | POA: Diagnosis not present

## 2020-04-06 DIAGNOSIS — I679 Cerebrovascular disease, unspecified: Secondary | ICD-10-CM | POA: Diagnosis not present

## 2020-04-06 DIAGNOSIS — I10 Essential (primary) hypertension: Secondary | ICD-10-CM | POA: Diagnosis not present

## 2020-04-06 DIAGNOSIS — R1310 Dysphagia, unspecified: Secondary | ICD-10-CM | POA: Diagnosis not present

## 2020-04-06 DIAGNOSIS — E1149 Type 2 diabetes mellitus with other diabetic neurological complication: Secondary | ICD-10-CM | POA: Diagnosis not present

## 2020-04-09 DIAGNOSIS — Z03818 Encounter for observation for suspected exposure to other biological agents ruled out: Secondary | ICD-10-CM | POA: Diagnosis not present

## 2020-04-10 DIAGNOSIS — R1310 Dysphagia, unspecified: Secondary | ICD-10-CM | POA: Diagnosis not present

## 2020-04-10 DIAGNOSIS — Z8616 Personal history of COVID-19: Secondary | ICD-10-CM | POA: Diagnosis not present

## 2020-04-10 DIAGNOSIS — I679 Cerebrovascular disease, unspecified: Secondary | ICD-10-CM | POA: Diagnosis not present

## 2020-04-10 DIAGNOSIS — G301 Alzheimer's disease with late onset: Secondary | ICD-10-CM | POA: Diagnosis not present

## 2020-04-10 DIAGNOSIS — G9340 Encephalopathy, unspecified: Secondary | ICD-10-CM | POA: Diagnosis not present

## 2020-04-10 DIAGNOSIS — I10 Essential (primary) hypertension: Secondary | ICD-10-CM | POA: Diagnosis not present

## 2020-04-12 DIAGNOSIS — I10 Essential (primary) hypertension: Secondary | ICD-10-CM | POA: Diagnosis not present

## 2020-04-12 DIAGNOSIS — R1310 Dysphagia, unspecified: Secondary | ICD-10-CM | POA: Diagnosis not present

## 2020-04-12 DIAGNOSIS — G9340 Encephalopathy, unspecified: Secondary | ICD-10-CM | POA: Diagnosis not present

## 2020-04-12 DIAGNOSIS — G301 Alzheimer's disease with late onset: Secondary | ICD-10-CM | POA: Diagnosis not present

## 2020-04-12 DIAGNOSIS — Z8616 Personal history of COVID-19: Secondary | ICD-10-CM | POA: Diagnosis not present

## 2020-04-12 DIAGNOSIS — I679 Cerebrovascular disease, unspecified: Secondary | ICD-10-CM | POA: Diagnosis not present

## 2020-04-13 DIAGNOSIS — G301 Alzheimer's disease with late onset: Secondary | ICD-10-CM | POA: Diagnosis not present

## 2020-04-13 DIAGNOSIS — Z8616 Personal history of COVID-19: Secondary | ICD-10-CM | POA: Diagnosis not present

## 2020-04-13 DIAGNOSIS — E119 Type 2 diabetes mellitus without complications: Secondary | ICD-10-CM | POA: Diagnosis not present

## 2020-04-13 DIAGNOSIS — Z8673 Personal history of transient ischemic attack (TIA), and cerebral infarction without residual deficits: Secondary | ICD-10-CM | POA: Diagnosis not present

## 2020-04-13 DIAGNOSIS — F0280 Dementia in other diseases classified elsewhere without behavioral disturbance: Secondary | ICD-10-CM | POA: Diagnosis not present

## 2020-04-13 DIAGNOSIS — I679 Cerebrovascular disease, unspecified: Secondary | ICD-10-CM | POA: Diagnosis not present

## 2020-04-13 DIAGNOSIS — R1310 Dysphagia, unspecified: Secondary | ICD-10-CM | POA: Diagnosis not present

## 2020-04-13 DIAGNOSIS — M810 Age-related osteoporosis without current pathological fracture: Secondary | ICD-10-CM | POA: Diagnosis not present

## 2020-04-13 DIAGNOSIS — Z87440 Personal history of urinary (tract) infections: Secondary | ICD-10-CM | POA: Diagnosis not present

## 2020-04-13 DIAGNOSIS — R634 Abnormal weight loss: Secondary | ICD-10-CM | POA: Diagnosis not present

## 2020-04-13 DIAGNOSIS — G9340 Encephalopathy, unspecified: Secondary | ICD-10-CM | POA: Diagnosis not present

## 2020-04-13 DIAGNOSIS — E039 Hypothyroidism, unspecified: Secondary | ICD-10-CM | POA: Diagnosis not present

## 2020-04-13 DIAGNOSIS — I10 Essential (primary) hypertension: Secondary | ICD-10-CM | POA: Diagnosis not present

## 2020-04-17 DIAGNOSIS — G301 Alzheimer's disease with late onset: Secondary | ICD-10-CM | POA: Diagnosis not present

## 2020-04-17 DIAGNOSIS — Z8616 Personal history of COVID-19: Secondary | ICD-10-CM | POA: Diagnosis not present

## 2020-04-17 DIAGNOSIS — R1310 Dysphagia, unspecified: Secondary | ICD-10-CM | POA: Diagnosis not present

## 2020-04-17 DIAGNOSIS — G9340 Encephalopathy, unspecified: Secondary | ICD-10-CM | POA: Diagnosis not present

## 2020-04-17 DIAGNOSIS — I10 Essential (primary) hypertension: Secondary | ICD-10-CM | POA: Diagnosis not present

## 2020-04-17 DIAGNOSIS — I679 Cerebrovascular disease, unspecified: Secondary | ICD-10-CM | POA: Diagnosis not present

## 2020-04-19 DIAGNOSIS — R1310 Dysphagia, unspecified: Secondary | ICD-10-CM | POA: Diagnosis not present

## 2020-04-19 DIAGNOSIS — G9340 Encephalopathy, unspecified: Secondary | ICD-10-CM | POA: Diagnosis not present

## 2020-04-19 DIAGNOSIS — Z8616 Personal history of COVID-19: Secondary | ICD-10-CM | POA: Diagnosis not present

## 2020-04-19 DIAGNOSIS — I679 Cerebrovascular disease, unspecified: Secondary | ICD-10-CM | POA: Diagnosis not present

## 2020-04-19 DIAGNOSIS — I10 Essential (primary) hypertension: Secondary | ICD-10-CM | POA: Diagnosis not present

## 2020-04-19 DIAGNOSIS — G301 Alzheimer's disease with late onset: Secondary | ICD-10-CM | POA: Diagnosis not present

## 2020-04-20 DIAGNOSIS — Z8616 Personal history of COVID-19: Secondary | ICD-10-CM | POA: Diagnosis not present

## 2020-04-20 DIAGNOSIS — I10 Essential (primary) hypertension: Secondary | ICD-10-CM | POA: Diagnosis not present

## 2020-04-20 DIAGNOSIS — I679 Cerebrovascular disease, unspecified: Secondary | ICD-10-CM | POA: Diagnosis not present

## 2020-04-20 DIAGNOSIS — G9340 Encephalopathy, unspecified: Secondary | ICD-10-CM | POA: Diagnosis not present

## 2020-04-20 DIAGNOSIS — G301 Alzheimer's disease with late onset: Secondary | ICD-10-CM | POA: Diagnosis not present

## 2020-04-20 DIAGNOSIS — R1310 Dysphagia, unspecified: Secondary | ICD-10-CM | POA: Diagnosis not present

## 2020-05-14 DEATH — deceased
